# Patient Record
Sex: Male | Born: 1940
Health system: Southern US, Community
[De-identification: ages and names within clinical notes are randomized; demographics above are authoritative.]

## PROBLEM LIST (undated history)

## (undated) DIAGNOSIS — E785 Hyperlipidemia, unspecified: Secondary | ICD-10-CM

## (undated) DIAGNOSIS — I10 Essential (primary) hypertension: Secondary | ICD-10-CM

## (undated) DIAGNOSIS — K219 Gastro-esophageal reflux disease without esophagitis: Secondary | ICD-10-CM

## (undated) HISTORY — PX: EYE SURGERY: SHX253

---

## 2011-01-24 ENCOUNTER — Emergency Department: Payer: Self-pay

## 2011-05-27 HISTORY — PX: BACK SURGERY: SHX140

## 2015-07-07 ENCOUNTER — Emergency Department: Payer: Medicare HMO

## 2015-07-07 ENCOUNTER — Encounter: Payer: Self-pay | Admitting: Emergency Medicine

## 2015-07-07 ENCOUNTER — Emergency Department
Admission: EM | Admit: 2015-07-07 | Discharge: 2015-07-07 | Disposition: A | Discharge status: Home / Self Care | Payer: Medicare HMO | Attending: Emergency Medicine | Admitting: Emergency Medicine

## 2015-07-07 DIAGNOSIS — Z7982 Long term (current) use of aspirin: Secondary | ICD-10-CM | POA: Insufficient documentation

## 2015-07-07 DIAGNOSIS — G8929 Other chronic pain: Secondary | ICD-10-CM | POA: Insufficient documentation

## 2015-07-07 DIAGNOSIS — I10 Essential (primary) hypertension: Secondary | ICD-10-CM | POA: Insufficient documentation

## 2015-07-07 DIAGNOSIS — K5900 Constipation, unspecified: Secondary | ICD-10-CM | POA: Insufficient documentation

## 2015-07-07 DIAGNOSIS — M5442 Lumbago with sciatica, left side: Secondary | ICD-10-CM | POA: Diagnosis not present

## 2015-07-07 DIAGNOSIS — M545 Low back pain: Secondary | ICD-10-CM | POA: Diagnosis present

## 2015-07-07 DIAGNOSIS — Z79899 Other long term (current) drug therapy: Secondary | ICD-10-CM | POA: Diagnosis not present

## 2015-07-07 HISTORY — DX: Essential (primary) hypertension: I10

## 2015-07-07 LAB — URINALYSIS COMPLETE WITH MICROSCOPIC (ARMC ONLY)
BILIRUBIN URINE: NEGATIVE
Bacteria, UA: NONE SEEN
GLUCOSE, UA: NEGATIVE mg/dL
Hgb urine dipstick: NEGATIVE
KETONES UR: NEGATIVE mg/dL
LEUKOCYTES UA: NEGATIVE
NITRITE: NEGATIVE
PH: 6 (ref 5.0–8.0)
Protein, ur: NEGATIVE mg/dL
Specific Gravity, Urine: 1.02 (ref 1.005–1.030)

## 2015-07-07 MED ORDER — OXYCODONE-ACETAMINOPHEN 5-325 MG PO TABS
1.0000 | ORAL_TABLET | Freq: Once | ORAL | Status: AC
  Administered 2015-07-07: 1 via ORAL

## 2015-07-07 MED ORDER — TRAMADOL HCL 50 MG PO TABS: 50.0000 mg | ORAL_TABLET | Freq: Four times a day (QID) | ORAL | Status: DC | PRN

## 2015-07-07 MED ORDER — OXYCODONE-ACETAMINOPHEN 5-325 MG PO TABS
2.0000 | ORAL_TABLET | Freq: Once | ORAL | Status: DC
  Filled 2015-07-07: qty 1

## 2015-07-07 MED ORDER — KETOROLAC TROMETHAMINE 30 MG/ML IJ SOLN
30.0000 mg | Freq: Once | INTRAMUSCULAR | Status: AC
  Administered 2015-07-07: 30 mg via INTRAMUSCULAR
  Filled 2015-07-07: qty 1

## 2015-07-07 NOTE — ED Notes (Signed)
Low back going down left leg for approx one month.  States usually goes to the [facility] but they are uanble to get him in.  States hasn't been getting around as much for the past month.  Takes gabapentin and methocarbonal.

## 2015-07-07 NOTE — ED Provider Notes (Signed)
[facility] Emergency Department Provider Note ____________________________________________  Time seen: Approximately 12:07 PM  I have reviewed the triage vital signs and the nursing notes.   HISTORY  Chief Complaint Back Pain   HPI Jordan Chang is a 74 y.o. male is here with complaint of low back pain with radiation down his left leg for approximately 1 month. Patient states that he has had back surgery in the past and that he generally goes to the [facility] but his wife was unable to get him there today. He denies any urinary symptoms. There is been no injury to his back and he denies fall. Currently he is taking gabapentin and methocarbamol for his back pain. Wife states that just in the last week they increased his gabapentin.Currently he rates his pain as a 5 out of 10.   Past Medical History  Diagnosis Date  . Hypertension     There are no active problems to display for this patient.   Past Surgical History  Procedure Laterality Date  . Back surgery    . Eye surgery      Current Outpatient Rx  Name  Route  Sig  Dispense  Refill  . aspirin 81 MG tablet   Oral   Take 81 mg by mouth daily.         . Cholecalciferol (D3-1000 PO)   Oral   Take by mouth.         . gabapentin (NEURONTIN) 300 MG capsule   Oral   Take 300 mg by mouth 3 (three) times daily.         . hydrochlorothiazide (HYDRODIURIL) 25 MG tablet   Oral   Take 25 mg by mouth daily.         Marland Kitchen losartan (COZAAR) 50 MG tablet   Oral   Take 50 mg by mouth daily.         . methocarbamol (ROBAXIN) 500 MG tablet   Oral   Take 500 mg by mouth 3 (three) times daily.         Marland Kitchen oxyCODONE-acetaminophen (PERCOCET/ROXICET) 5-325 MG tablet   Oral   Take 1 tablet by mouth once.         . traMADol (ULTRAM) 50 MG tablet   Oral   Take 1 tablet (50 mg total) by mouth every 6 (six) hours as needed.   20 tablet   0   . triamterene-hydrochlorothiazide (DYAZIDE) 37.5-25  MG capsule   Oral   Take 1 capsule by mouth daily.         . vitamin B-12 (CYANOCOBALAMIN) 1000 MCG tablet   Oral   Take 1,000 mcg by mouth daily.           Allergies Review of patient's allergies indicates no known allergies.  No family history on file.  Social History Social History  Substance Use Topics  . Smoking status: Never Smoker   . Smokeless tobacco: None  . Alcohol Use: No    Review of Systems Constitutional: No fever/chills Eyes: No visual changes. ENT: No sore throat. Cardiovascular: Denies chest pain. Respiratory: Denies shortness of breath. Gastrointestinal: No abdominal pain.  No nausea, no vomiting.  No diarrhea.  Occasional constipation. Genitourinary: Negative for dysuria. Musculoskeletal: Positive for back pain. Skin: Negative for rash. Neurological: Negative for headaches, focal weakness. Positive for left leg paresthesias.  10-point ROS otherwise negative.  ____________________________________________   PHYSICAL EXAM:  VITAL SIGNS: ED Triage Vitals  Enc Vitals Group  BP 07/07/15 1114 150/64 mmHg     Pulse Rate 07/07/15 1114 82     Resp 07/07/15 1114 20     Temp 07/07/15 1114 97.6 F (36.4 C)     Temp Source 07/07/15 1114 Oral     SpO2 07/07/15 1114 99 %     Weight 07/07/15 1114 260 lb (117.935 kg)     Height 07/07/15 1114 6' (1.829 m)     Head Cir --      Peak Flow --      Pain Score 07/07/15 1116 5     Pain Loc --      Pain Edu? --      Excl. in GC? --     Constitutional: Alert and oriented. Well appearing and in no acute distress. Eyes: Conjunctivae are normal. PERRL. EOMI. Head: Atraumatic. Nose: No congestion/rhinnorhea. Neck: No stridor.   Cardiovascular: Normal rate, regular rhythm. Grossly normal heart sounds.  Good peripheral circulation. Respiratory: Normal respiratory effort.  No retractions. Lungs CTAB. Gastrointestinal: Soft and nontender. No distention. Bowel sounds are present 4  quadrants. Musculoskeletal: Moderate tenderness on palpation of the lumbar spine particularly L3-L4, L4-L5, L5 and S1. Range of motion is restricted secondary to patient's pain. No active muscle spasms were seen. Patient needed assistance to get up from a supine position to a sitting position secondary to pain. Gait was not tested due to patient's pain. Neurologic:  Normal speech and language. No gross focal neurologic deficits are appreciated. No gait instability. Skin:  Skin is warm, dry and intact. No rash noted. Psychiatric: Mood and affect are normal. Speech and behavior are normal.  ____________________________________________   LABS (all labs ordered are listed, but only abnormal results are displayed)  Labs Reviewed  URINALYSIS COMPLETEWITH MICROSCOPIC ([facility] ONLY) - Abnormal; Notable for the following:    Color, Urine YELLOW (*)    APPearance CLEAR (*)    Squamous Epithelial / LPF 0-5 (*)    All other components within normal limits     RADIOLOGY  Lumbar spine shows scattered degenerative disc disease changes. Lumbar spine per radiologist. ____________________________________________   PROCEDURES  Procedure(s) performed: None  Critical Care performed: No  ____________________________________________   INITIAL IMPRESSION / ASSESSMENT AND PLAN / ED COURSE  Pertinent labs & imaging results that were available during my care of the patient were reviewed by me and considered in my medical decision making (see chart for details).  Patient states that when he sat up to go for his x-rays that the room was spinning which was probably related to the Percocet he was given before. We discussed pain medication and the increased risk of falling. Wife will call [facility] for further follow-up. ____________________________________________   FINAL CLINICAL IMPRESSION(S) / ED DIAGNOSES  Final diagnoses:  Chronic midline low back pain with left-sided sciatica      Tommi RumpsRhonda L  Summers, PA-C 07/07/15 1533  Minna AntisKevin Paduchowski, MD 07/07/15 [phone]

## 2015-07-07 NOTE — Discharge Instructions (Signed)
Chronic Back Pain  When back pain lasts longer than 3 months, it is called chronic back pain.People with chronic back pain often go through certain periods that are more intense (flare-ups).  CAUSES Chronic back pain can be caused by wear and tear (degeneration) on different structures in your back. These structures include:  The bones of your spine (vertebrae) and the joints surrounding your spinal cord and nerve roots (facets).  The strong, fibrous tissues that connect your vertebrae (ligaments). Degeneration of these structures may result in pressure on your nerves. This can lead to constant pain. HOME CARE INSTRUCTIONS  Avoid bending, heavy lifting, prolonged sitting, and activities which make the problem worse.  Take brief periods of rest throughout the day to reduce your pain. Lying down or standing usually is better than sitting while you are resting.  Take over-the-counter or prescription medicines only as directed by your caregiver. SEEK IMMEDIATE MEDICAL CARE IF:   You have weakness or numbness in one of your legs or feet.  You have trouble controlling your bladder or bowels.  You have nausea, vomiting, abdominal pain, shortness of breath, or fainting.   This information is not intended to replace advice given to you by your health care provider. Make sure you discuss any questions you have with your health care provider.   Document Released: 07/20/2004 Document Revised: 09/04/2011 Document Reviewed: 11/30/2014 Elsevier Interactive Patient Education 2016 [employer]    Follow-up with your doctor at the [facility] or the orthopedist that was given on your discharge papers today. A prescription for tramadol was given to you today to take for severe pain. The aware that this may increase  of drowsiness and increase your risk for falling.

## 2015-07-07 NOTE — ED Notes (Signed)
Pt denies injury or urinary S/S.

## 2015-12-02 ENCOUNTER — Encounter: Payer: Self-pay | Admitting: *Deleted

## 2015-12-02 ENCOUNTER — Emergency Department
Admission: EM | Admit: 2015-12-02 | Discharge: 2015-12-02 | Disposition: A | Discharge status: Home / Self Care | Payer: Commercial Managed Care - HMO | Attending: Emergency Medicine | Admitting: Emergency Medicine

## 2015-12-02 ENCOUNTER — Emergency Department: Payer: Commercial Managed Care - HMO

## 2015-12-02 DIAGNOSIS — I10 Essential (primary) hypertension: Secondary | ICD-10-CM | POA: Diagnosis not present

## 2015-12-02 DIAGNOSIS — M5432 Sciatica, left side: Secondary | ICD-10-CM

## 2015-12-02 DIAGNOSIS — M5442 Lumbago with sciatica, left side: Secondary | ICD-10-CM | POA: Insufficient documentation

## 2015-12-02 DIAGNOSIS — Z79899 Other long term (current) drug therapy: Secondary | ICD-10-CM | POA: Diagnosis not present

## 2015-12-02 DIAGNOSIS — Z7982 Long term (current) use of aspirin: Secondary | ICD-10-CM | POA: Diagnosis not present

## 2015-12-02 DIAGNOSIS — M545 Low back pain: Secondary | ICD-10-CM | POA: Diagnosis present

## 2015-12-02 MED ORDER — KETOROLAC TROMETHAMINE 30 MG/ML IJ SOLN
15.0000 mg | Freq: Once | INTRAMUSCULAR | Status: AC
  Administered 2015-12-02: 15 mg via INTRAVENOUS
  Filled 2015-12-02: qty 1

## 2015-12-02 MED ORDER — PREDNISONE 10 MG (21) PO TBPK
ORAL_TABLET | ORAL | Status: DC
Start: 2015-12-02 — End: 2016-01-12

## 2015-12-02 MED ORDER — HYDROMORPHONE HCL 1 MG/ML IJ SOLN
1.0000 mg | Freq: Once | INTRAMUSCULAR | Status: AC
Start: 2015-12-02 — End: 2015-12-02
  Administered 2015-12-02: 1 mg via INTRAVENOUS
  Filled 2015-12-02: qty 1

## 2015-12-02 MED ORDER — DEXAMETHASONE SODIUM PHOSPHATE 10 MG/ML IJ SOLN
10.0000 mg | Freq: Once | INTRAMUSCULAR | Status: AC
  Administered 2015-12-02: 10 mg via INTRAVENOUS
  Filled 2015-12-02: qty 1

## 2015-12-02 MED ORDER — HYDROMORPHONE HCL 1 MG/ML IJ SOLN
0.5000 mg | Freq: Once | INTRAMUSCULAR | Status: AC
  Administered 2015-12-02: 0.5 mg via INTRAVENOUS
  Filled 2015-12-02: qty 1

## 2015-12-02 MED ORDER — HYDROMORPHONE HCL 2 MG PO TABS: 2.0000 mg | ORAL_TABLET | Freq: Two times a day (BID) | ORAL | Status: DC | PRN

## 2015-12-02 NOTE — Discharge Instructions (Signed)

## 2015-12-02 NOTE — ED Provider Notes (Signed)
[facility] Emergency Department Provider Note        Time seen: ----------------------------------------- 2:24 PM on 12/02/2015 -----------------------------------------    I have reviewed the triage vital signs and the nursing notes.   HISTORY  Chief Complaint Shortness of Breath and Hip Pain    HPI Jordan Chang is a 75 y.o. male who presents to ER with main complaints of radicular left leg pain. Patient has pain in his low back and around his left hip that radiates along the outside of his left upper and lower leg. He denies any fevers or chills, denies loss of bowel or bladder function. Denies numbness. He has been given pain medicine by his doctor that is not helping.   Past Medical History  Diagnosis Date  . Hypertension     There are no active problems to display for this patient.   Past Surgical History  Procedure Laterality Date  . Back surgery    . Eye surgery      Allergies Review of patient's allergies indicates no known allergies.  Social History Social History  Substance Use Topics  . Smoking status: Never Smoker   . Smokeless tobacco: None  . Alcohol Use: No    Review of Systems Constitutional: Negative for fever. Cardiovascular: Negative for chest pain. Respiratory: Positive for recent shortness of breath Gastrointestinal: Negative for abdominal pain, vomiting and diarrhea. Musculoskeletal: Positive for low back pain, radicular left leg pain Skin: Negative for rash. Neurological: Negative for headaches, focal weakness or numbness.  10-point ROS otherwise negative.  ____________________________________________   PHYSICAL EXAM:  VITAL SIGNS: ED Triage Vitals  Enc Vitals Group     BP 12/02/15 1139 180/75 mmHg     Pulse Rate 12/02/15 1139 101     Resp 12/02/15 1139 20     Temp 12/02/15 1139 99 F (37.2 C)     Temp Source 12/02/15 1139 Oral     SpO2 12/02/15 1139 95 %     Weight 12/02/15 1139 275 lb  (124.739 kg)     Height 12/02/15 1139 6' (1.829 m)     Head Cir --      Peak Flow --      Pain Score 12/02/15 1139 9     Pain Loc --      Pain Edu? --      Excl. in GC? --    Constitutional: Alert and oriented. Well appearing and in no distress.Obese Eyes: Conjunctivae are normal. PERRL. Normal extraocular movements. Cardiovascular: Normal rate, regular rhythm. No murmurs, rubs, or gallops. Respiratory: Normal respiratory effort without tachypnea nor retractions. Breath sounds are clear and equal bilaterally. No wheezes/rales/rhonchi. Gastrointestinal: Soft and nontender. Normal bowel sounds Musculoskeletal: Nontender with normal range of motion in all extremities. No lower extremity tenderness nor edema. L5 radiculopathy described in the left leg Neurologic:  Normal speech and language. No gross focal neurologic deficits are appreciated. Good reflexes in the left leg Skin:  Skin is warm, dry and intact. No rash noted. Psychiatric: Mood and affect are normal. Speech and behavior are normal.  ____________________________________________  EKG: Interpreted by me. Sinus tachycardia with a rate of 102 bpm, normal PR interval, normal QRS, normal QT interval, left axis deviation, incomplete right bundle branch block.  ____________________________________________  ED COURSE:  Pertinent labs & imaging results that were available during my care of the patient were reviewed by me and considered in my medical decision making (see chart for details). Patient presents to ER with Radicular left  leg pain consistent with sciatica, likely L5 distribution. Patient will be given Dilaudid, Decadron, Toradol. ____________________________________________    RADIOLOGY  Chest x-ray is unremarkable  ____________________________________________  FINAL ASSESSMENT AND PLAN  Sciatica  Plan: Patient with imaging as dictated above. Patient is in no acute distress, dyspnea he thinks was from anxiety. He  does describe radiculopathy and left leg which has been intermittent since January. He'll be discharged with steroid taper and stronger pain medication. He stable for outpatient follow-up with his doctor.   Emily Filbert, MD   Note: This dictation was prepared with Dragon dictation. Any transcriptional errors that result from this process are unintentional   Emily Filbert, MD 12/02/15 1429

## 2015-12-02 NOTE — ED Notes (Signed)
MD at bedside. 

## 2015-12-02 NOTE — ED Notes (Signed)
Pt complains of shortness of breath and right hip and leg pain, pt is in no resp distress

## 2015-12-07 ENCOUNTER — Other Ambulatory Visit: Payer: Self-pay | Admitting: Internal Medicine

## 2015-12-10 ENCOUNTER — Other Ambulatory Visit: Payer: Self-pay | Admitting: Internal Medicine

## 2015-12-10 DIAGNOSIS — M5416 Radiculopathy, lumbar region: Secondary | ICD-10-CM

## 2015-12-18 ENCOUNTER — Ambulatory Visit
Admission: RE | Admit: 2015-12-18 | Discharge: 2015-12-18 | Disposition: A | Discharge status: Home / Self Care | Payer: Commercial Managed Care - HMO | Source: Ambulatory Visit | Attending: Internal Medicine | Admitting: Internal Medicine

## 2015-12-18 DIAGNOSIS — M5416 Radiculopathy, lumbar region: Secondary | ICD-10-CM

## 2015-12-18 MED ORDER — GADOBENATE DIMEGLUMINE 529 MG/ML IV SOLN
20.0000 mL | Freq: Once | INTRAVENOUS | Status: AC | PRN
  Administered 2015-12-18: 20 mL via INTRAVENOUS

## 2016-01-11 ENCOUNTER — Other Ambulatory Visit: Payer: Self-pay | Admitting: Neurosurgery

## 2016-01-17 ENCOUNTER — Encounter (HOSPITAL_COMMUNITY): Payer: Self-pay

## 2016-01-17 ENCOUNTER — Encounter (HOSPITAL_COMMUNITY)
Admission: RE | Admit: 2016-01-17 | Discharge: 2016-01-17 | Disposition: A | Discharge status: Home / Self Care | Payer: Commercial Managed Care - HMO | Source: Ambulatory Visit | Attending: Neurosurgery | Admitting: Neurosurgery

## 2016-01-17 DIAGNOSIS — I10 Essential (primary) hypertension: Secondary | ICD-10-CM | POA: Diagnosis not present

## 2016-01-17 DIAGNOSIS — K219 Gastro-esophageal reflux disease without esophagitis: Secondary | ICD-10-CM | POA: Diagnosis not present

## 2016-01-17 DIAGNOSIS — Z7982 Long term (current) use of aspirin: Secondary | ICD-10-CM | POA: Diagnosis not present

## 2016-01-17 DIAGNOSIS — Z791 Long term (current) use of non-steroidal anti-inflammatories (NSAID): Secondary | ICD-10-CM | POA: Diagnosis not present

## 2016-01-17 DIAGNOSIS — M5116 Intervertebral disc disorders with radiculopathy, lumbar region: Secondary | ICD-10-CM | POA: Diagnosis not present

## 2016-01-17 DIAGNOSIS — Z79899 Other long term (current) drug therapy: Secondary | ICD-10-CM | POA: Diagnosis not present

## 2016-01-17 HISTORY — DX: Gastro-esophageal reflux disease without esophagitis: K21.9

## 2016-01-17 LAB — BASIC METABOLIC PANEL
Anion gap: 8 (ref 5–15)
BUN: 24 mg/dL — AB (ref 6–20)
CHLORIDE: 107 mmol/L (ref 101–111)
CO2: 22 mmol/L (ref 22–32)
Calcium: 9.9 mg/dL (ref 8.9–10.3)
Creatinine, Ser: 1.44 mg/dL — ABNORMAL HIGH (ref 0.61–1.24)
GFR calc Af Amer: 53 mL/min — ABNORMAL LOW (ref >60)
GFR calc non Af Amer: 46 mL/min — ABNORMAL LOW (ref >60)
Glucose, Bld: 165 mg/dL — ABNORMAL HIGH (ref 65–99)
POTASSIUM: 4.1 mmol/L (ref 3.5–5.1)
SODIUM: 137 mmol/L (ref 135–145)

## 2016-01-17 LAB — CBC
HEMATOCRIT: 44.1 % (ref 39.0–52.0)
Hemoglobin: 15 g/dL (ref 13.0–17.0)
MCH: 31.4 pg (ref 26.0–34.0)
MCHC: 34 g/dL (ref 30.0–36.0)
MCV: 92.3 fL (ref 78.0–100.0)
Platelets: 182 10*3/uL (ref 150–400)
RBC: 4.78 MIL/uL (ref 4.22–5.81)
RDW: 12.8 % (ref 11.5–15.5)
WBC: 6.2 10*3/uL (ref 4.0–10.5)

## 2016-01-17 LAB — SURGICAL PCR SCREEN
MRSA, PCR: NEGATIVE
Staphylococcus aureus: NEGATIVE

## 2016-01-17 NOTE — Pre-Procedure Instructions (Signed)
Jordan Chang  01/17/2016      [facility] - [place], [place] - [address] [place] [address] Phone: [phone] Fax: [phone]    Your procedure is scheduled on 01/20/16  Report to [facility] Admitting at 1000 A.M.  Call this number if you have problems the morning of surgery:  [phone]   Remember:  Do not eat food or drink liquids after midnight.  Take these medicines the morning of surgery with A SIP OF WATER gabpentin, pepcid, tramadol if needed   STOP all herbel meds, nsaids (aleve,naproxen,advil,ibuprofen) 5 days prior to surgery including all vitamins, aspirin   Do not wear jewelry, make-up or nail polish.  Do not wear lotions, powders, or perfumes.  You may wear deoderant.  Do not shave 48 hours prior to surgery.  Men may shave face and neck.  Do not bring valuables to the hospital.  [facility] is not responsible for any belongings or valuables.  Contacts, dentures or bridgework may not be worn into surgery.  Leave your suitcase in the car.  After surgery it may be brought to your room.  For patients admitted to the hospital, discharge time will be determined by your treatment team.  Patients discharged the day of surgery will not be allowed to drive home.   Name and phone number of your driver:    Special instructions:   Special Instructions: [facility] - Preparing for Surgery  Before surgery, you can play an important role.  Because skin is not sterile, your skin needs to be as free of germs as possible.  You can reduce the number of germs on you skin by washing with CHG (chlorahexidine gluconate) soap before surgery.  CHG is an antiseptic cleaner which kills germs and bonds with the skin to continue killing germs even after washing.  Please DO NOT use if you have an allergy to CHG or antibacterial soaps.  If your skin becomes reddened/irritated stop using the CHG and inform your nurse when you arrive at Short  Stay.  Do not shave (including legs and underarms) for at least 48 hours prior to the first CHG shower.  You may shave your face.  Please follow these instructions carefully:   1.  Shower with CHG Soap the night before surgery and the morning of Surgery.  2.  If you choose to wash your hair, wash your hair first as usual with your normal shampoo.  3.  After you shampoo, rinse your hair and body thoroughly to remove the Shampoo.  4.  Use CHG as you would any other liquid soap.  You can apply chg directly  to the skin and wash gently with scrungie or a clean washcloth.  5.  Apply the CHG Soap to your body ONLY FROM THE NECK DOWN.  Do not use on open wounds or open sores.  Avoid contact with your eyes ears, mouth and genitals (private parts).  Wash genitals (private parts)       with your normal soap.  6.  Wash thoroughly, paying special attention to the area where your surgery will be performed.  7.  Thoroughly rinse your body with warm water from the neck down.  8.  DO NOT shower/wash with your normal soap after using and rinsing off the CHG Soap.  9.  Pat yourself dry with a clean towel.            10.  Wear clean  pajamas.            11.  Place clean sheets on your bed the night of your first shower and do not sleep with pets.  Day of Surgery  Do not apply any lotions/deodorants the morning of surgery.  Please wear clean clothes to the hospital/surgery center.  Please read over the following fact sheets that you were given. MRSA Information

## 2016-01-17 NOTE — Progress Notes (Signed)
   01/17/16 0835  OBSTRUCTIVE SLEEP APNEA  Have you ever been diagnosed with sleep apnea through a sleep study? No  Do you snore loudly (loud enough to be heard through closed doors)?  0  Do you often feel tired, fatigued, or sleepy during the daytime (such as falling asleep during driving or talking to someone)? 0  Has anyone observed you stop breathing during your sleep? 0  Do you have, or are you being treated for high blood pressure? 1  BMI more than 35 kg/m2? 1  Age > 50 (1-yes) 1  Neck circumference greater than:Male 16 inches or larger, Male 17inches or larger? 1 (47.5)  Male Gender (Yes=1) 1  Obstructive Sleep Apnea Score 5  Score 5 or greater  Results sent to PCP

## 2016-01-18 NOTE — Progress Notes (Signed)
Anesthesia Chart Review: Patient is a 75 year old male scheduled for left L2-3, L3-4, L4-5 microdiskectomy on 01/20/16 by Dr. Lovell Sheehan.  History includes HTN, GERD, non-smoker, back surgery '12. BMI is consistent with obesity. OSA screening score was 5.   PCP is Dr. Marcello Fennel ([facility]).   Meds include ASA, Lipitor, Neurontin, HCTZ, losartan, Pepcid, tramadol, Dyazide.  12/02/15 EKG (done in the ED during evaluation for hip pain): ST at 102 bpm, LAD, pulmonary disease pattern, incomplete right BBB. Baseline wanderer. HR was 83 at PAT.  12/02/15 CXR: IMPRESSION: No active cardiopulmonary disease.  Preoperative labs noted. BUN 24, Cr 1.44. (Comparison labs in [facility] Everywhere from 11/17/15 showed BUN 22/Cr 1.2.) CBC WNL.  If no acute changes then I anticipate that he can proceed as planned.  Velna Ochs [facility] Short Stay Center/Anesthesiology Phone [phone] 01/18/2016 3:34 PM

## 2016-01-19 MED ORDER — DEXTROSE 5 % IV SOLN
3.0000 g | INTRAVENOUS | Status: AC
  Administered 2016-01-20: 3 g via INTRAVENOUS
  Filled 2016-01-19: qty 3000

## 2016-01-20 ENCOUNTER — Encounter (HOSPITAL_COMMUNITY): Payer: Self-pay | Admitting: *Deleted

## 2016-01-20 ENCOUNTER — Inpatient Hospital Stay (HOSPITAL_COMMUNITY): Payer: Commercial Managed Care - HMO

## 2016-01-20 ENCOUNTER — Inpatient Hospital Stay (HOSPITAL_COMMUNITY): Payer: Commercial Managed Care - HMO | Admitting: Anesthesiology

## 2016-01-20 ENCOUNTER — Inpatient Hospital Stay (HOSPITAL_COMMUNITY): Payer: Commercial Managed Care - HMO | Admitting: Vascular Surgery

## 2016-01-20 ENCOUNTER — Encounter (HOSPITAL_COMMUNITY)
Admission: RE | Disposition: A | Discharge status: Home / Self Care | Payer: Self-pay | Source: Ambulatory Visit | Attending: Neurosurgery

## 2016-01-20 ENCOUNTER — Observation Stay (HOSPITAL_COMMUNITY)
Admission: RE | Admit: 2016-01-20 | Discharge: 2016-01-21 | Disposition: A | Discharge status: Home / Self Care | Payer: Commercial Managed Care - HMO | Source: Ambulatory Visit | Attending: Neurosurgery | Admitting: Neurosurgery

## 2016-01-20 DIAGNOSIS — I10 Essential (primary) hypertension: Secondary | ICD-10-CM | POA: Diagnosis not present

## 2016-01-20 DIAGNOSIS — M5116 Intervertebral disc disorders with radiculopathy, lumbar region: Principal | ICD-10-CM | POA: Insufficient documentation

## 2016-01-20 DIAGNOSIS — Z7982 Long term (current) use of aspirin: Secondary | ICD-10-CM | POA: Insufficient documentation

## 2016-01-20 DIAGNOSIS — K219 Gastro-esophageal reflux disease without esophagitis: Secondary | ICD-10-CM | POA: Diagnosis not present

## 2016-01-20 DIAGNOSIS — M5126 Other intervertebral disc displacement, lumbar region: Secondary | ICD-10-CM | POA: Diagnosis present

## 2016-01-20 DIAGNOSIS — Z419 Encounter for procedure for purposes other than remedying health state, unspecified: Secondary | ICD-10-CM

## 2016-01-20 DIAGNOSIS — Z791 Long term (current) use of non-steroidal anti-inflammatories (NSAID): Secondary | ICD-10-CM | POA: Insufficient documentation

## 2016-01-20 DIAGNOSIS — Z79899 Other long term (current) drug therapy: Secondary | ICD-10-CM | POA: Insufficient documentation

## 2016-01-20 HISTORY — PX: LUMBAR LAMINECTOMY/DECOMPRESSION MICRODISCECTOMY: SHX5026

## 2016-01-20 SURGERY — LUMBAR LAMINECTOMY/DECOMPRESSION MICRODISCECTOMY 3 LEVELS
Anesthesia: General | Site: Back | Laterality: Left

## 2016-01-20 MED ORDER — ROCURONIUM BROMIDE 100 MG/10ML IV SOLN
INTRAVENOUS | Status: DC | PRN
  Administered 2016-01-20: 50 mg via INTRAVENOUS

## 2016-01-20 MED ORDER — CEFAZOLIN SODIUM-DEXTROSE 2-4 GM/100ML-% IV SOLN
2.0000 g | Freq: Three times a day (TID) | INTRAVENOUS | Status: AC
  Administered 2016-01-20: 2 g via INTRAVENOUS
  Filled 2016-01-20: qty 100

## 2016-01-20 MED ORDER — DOCUSATE SODIUM 100 MG PO CAPS
100.0000 mg | ORAL_CAPSULE | Freq: Two times a day (BID) | ORAL | Status: DC
  Administered 2016-01-20 – 2016-01-21 (×2): 100 mg via ORAL
  Filled 2016-01-20 (×2): qty 1

## 2016-01-20 MED ORDER — OXYCODONE-ACETAMINOPHEN 5-325 MG PO TABS
1.0000 | ORAL_TABLET | Freq: Once | ORAL | Status: AC
  Administered 2016-01-20: 1 via ORAL
  Filled 2016-01-20: qty 1

## 2016-01-20 MED ORDER — PHENOL 1.4 % MT LIQD: 1.0000 | OROMUCOSAL | Status: DC | PRN

## 2016-01-20 MED ORDER — BACITRACIN ZINC 500 UNIT/GM EX OINT
TOPICAL_OINTMENT | CUTANEOUS | Status: DC | PRN
  Administered 2016-01-20: 1 via TOPICAL

## 2016-01-20 MED ORDER — ATORVASTATIN CALCIUM 20 MG PO TABS
10.0000 mg | ORAL_TABLET | ORAL | Status: DC
  Administered 2016-01-21: 10 mg via ORAL
  Filled 2016-01-20: qty 1

## 2016-01-20 MED ORDER — SODIUM CHLORIDE 0.9 % IJ SOLN
INTRAMUSCULAR | Status: AC
  Filled 2016-01-20: qty 10

## 2016-01-20 MED ORDER — LACTATED RINGERS IV SOLN: INTRAVENOUS | Status: DC

## 2016-01-20 MED ORDER — LIDOCAINE 2% (20 MG/ML) 5 ML SYRINGE
INTRAMUSCULAR | Status: AC
  Filled 2016-01-20: qty 15

## 2016-01-20 MED ORDER — ACETAMINOPHEN 650 MG RE SUPP: 650.0000 mg | RECTAL | Status: DC | PRN

## 2016-01-20 MED ORDER — MEPERIDINE HCL 25 MG/ML IJ SOLN: 6.2500 mg | INTRAMUSCULAR | Status: DC | PRN

## 2016-01-20 MED ORDER — SUCCINYLCHOLINE CHLORIDE 200 MG/10ML IV SOSY
PREFILLED_SYRINGE | INTRAVENOUS | Status: AC
  Filled 2016-01-20: qty 10

## 2016-01-20 MED ORDER — DIAZEPAM 5 MG PO TABS
5.0000 mg | ORAL_TABLET | Freq: Four times a day (QID) | ORAL | Status: DC | PRN
  Administered 2016-01-20 – 2016-01-21 (×3): 5 mg via ORAL
  Filled 2016-01-20 (×3): qty 1

## 2016-01-20 MED ORDER — HYDROMORPHONE HCL 1 MG/ML IJ SOLN: 0.2500 mg | INTRAMUSCULAR | Status: DC | PRN

## 2016-01-20 MED ORDER — MIDAZOLAM HCL 5 MG/5ML IJ SOLN
INTRAMUSCULAR | Status: DC | PRN
  Administered 2016-01-20: 2 mg via INTRAVENOUS

## 2016-01-20 MED ORDER — MIDAZOLAM HCL 2 MG/2ML IJ SOLN
INTRAMUSCULAR | Status: AC
  Filled 2016-01-20: qty 2

## 2016-01-20 MED ORDER — PHENYLEPHRINE HCL 10 MG/ML IJ SOLN
INTRAMUSCULAR | Status: DC | PRN
  Administered 2016-01-20 (×4): 80 ug via INTRAVENOUS

## 2016-01-20 MED ORDER — GABAPENTIN 300 MG PO CAPS
600.0000 mg | ORAL_CAPSULE | Freq: Three times a day (TID) | ORAL | Status: DC
  Administered 2016-01-20 – 2016-01-21 (×2): 600 mg via ORAL
  Filled 2016-01-20 (×2): qty 2

## 2016-01-20 MED ORDER — ACETAMINOPHEN 325 MG PO TABS: 650.0000 mg | ORAL_TABLET | ORAL | Status: DC | PRN

## 2016-01-20 MED ORDER — BUPIVACAINE-EPINEPHRINE (PF) 0.5% -1:200000 IJ SOLN
INTRAMUSCULAR | Status: DC | PRN
  Administered 2016-01-20 (×2): 10 mL

## 2016-01-20 MED ORDER — THROMBIN 5000 UNITS EX SOLR
OROMUCOSAL | Status: DC | PRN
  Administered 2016-01-20: 15:00:00 via TOPICAL

## 2016-01-20 MED ORDER — HYDROCODONE-ACETAMINOPHEN 5-325 MG PO TABS: 1.0000 | ORAL_TABLET | ORAL | Status: DC | PRN

## 2016-01-20 MED ORDER — LIDOCAINE HCL (CARDIAC) 20 MG/ML IV SOLN
INTRAVENOUS | Status: DC | PRN
  Administered 2016-01-20: 50 mg via INTRAVENOUS

## 2016-01-20 MED ORDER — CHLORHEXIDINE GLUCONATE CLOTH 2 % EX PADS: 6.0000 | MEDICATED_PAD | Freq: Once | CUTANEOUS | Status: DC

## 2016-01-20 MED ORDER — PHENYLEPHRINE HCL 10 MG/ML IJ SOLN
INTRAVENOUS | Status: DC | PRN
  Administered 2016-01-20: 40 ug/min via INTRAVENOUS

## 2016-01-20 MED ORDER — PROPOFOL 10 MG/ML IV BOLUS
INTRAVENOUS | Status: DC | PRN
  Administered 2016-01-20: 150 mg via INTRAVENOUS

## 2016-01-20 MED ORDER — MORPHINE SULFATE (PF) 2 MG/ML IV SOLN: 1.0000 mg | INTRAVENOUS | Status: DC | PRN

## 2016-01-20 MED ORDER — MENTHOL 3 MG MT LOZG: 1.0000 | LOZENGE | OROMUCOSAL | Status: DC | PRN

## 2016-01-20 MED ORDER — ONDANSETRON HCL 4 MG/2ML IJ SOLN
INTRAMUSCULAR | Status: DC | PRN
  Administered 2016-01-20: 4 mg via INTRAVENOUS

## 2016-01-20 MED ORDER — MORPHINE SULFATE (PF) 4 MG/ML IV SOLN
INTRAVENOUS | Status: AC
  Administered 2016-01-20: 4 mg
  Filled 2016-01-20: qty 1

## 2016-01-20 MED ORDER — FENTANYL CITRATE (PF) 250 MCG/5ML IJ SOLN
INTRAMUSCULAR | Status: AC
  Filled 2016-01-20: qty 5

## 2016-01-20 MED ORDER — 0.9 % SODIUM CHLORIDE (POUR BTL) OPTIME
TOPICAL | Status: DC | PRN
  Administered 2016-01-20: 1000 mL

## 2016-01-20 MED ORDER — SUFENTANIL CITRATE 50 MCG/ML IV SOLN
INTRAVENOUS | Status: AC
  Filled 2016-01-20: qty 1

## 2016-01-20 MED ORDER — MUPIROCIN 2 % EX OINT
TOPICAL_OINTMENT | CUTANEOUS | Status: AC
  Filled 2016-01-20: qty 22

## 2016-01-20 MED ORDER — TRIAMTERENE-HCTZ 37.5-25 MG PO TABS
1.0000 | ORAL_TABLET | Freq: Every day | ORAL | Status: DC
  Administered 2016-01-20 – 2016-01-21 (×2): 1 via ORAL
  Filled 2016-01-20 (×3): qty 1

## 2016-01-20 MED ORDER — FAMOTIDINE 20 MG PO TABS: 20.0000 mg | ORAL_TABLET | Freq: Every day | ORAL | Status: DC | PRN

## 2016-01-20 MED ORDER — BISACODYL 10 MG RE SUPP: 10.0000 mg | Freq: Every day | RECTAL | Status: DC | PRN

## 2016-01-20 MED ORDER — SODIUM CHLORIDE 0.9 % IR SOLN
Status: DC | PRN
  Administered 2016-01-20: 15:00:00

## 2016-01-20 MED ORDER — ALUM & MAG HYDROXIDE-SIMETH 200-200-20 MG/5ML PO SUSP: 30.0000 mL | Freq: Four times a day (QID) | ORAL | Status: DC | PRN

## 2016-01-20 MED ORDER — OXYCODONE-ACETAMINOPHEN 5-325 MG PO TABS
1.0000 | ORAL_TABLET | ORAL | Status: DC | PRN
  Administered 2016-01-20 – 2016-01-21 (×4): 2 via ORAL
  Filled 2016-01-20 (×4): qty 2

## 2016-01-20 MED ORDER — OXYCODONE-ACETAMINOPHEN 5-325 MG PO TABS
ORAL_TABLET | ORAL | Status: AC
  Filled 2016-01-20: qty 1

## 2016-01-20 MED ORDER — SUFENTANIL CITRATE 50 MCG/ML IV SOLN
INTRAVENOUS | Status: DC | PRN
  Administered 2016-01-20: 10 ug via INTRAVENOUS
  Administered 2016-01-20: 20 ug via INTRAVENOUS
  Administered 2016-01-20: 10 ug via INTRAVENOUS

## 2016-01-20 MED ORDER — THROMBIN 20000 UNITS EX SOLR
CUTANEOUS | Status: DC | PRN
  Administered 2016-01-20: 15:00:00 via TOPICAL

## 2016-01-20 MED ORDER — LOSARTAN POTASSIUM 50 MG PO TABS
50.0000 mg | ORAL_TABLET | Freq: Every day | ORAL | Status: DC
  Administered 2016-01-20 – 2016-01-21 (×2): 50 mg via ORAL
  Filled 2016-01-20 (×3): qty 1

## 2016-01-20 MED ORDER — LACTATED RINGERS IV SOLN
INTRAVENOUS | Status: DC
  Administered 2016-01-20 (×2): via INTRAVENOUS

## 2016-01-20 MED ORDER — PROPOFOL 10 MG/ML IV BOLUS
INTRAVENOUS | Status: AC
  Filled 2016-01-20: qty 40

## 2016-01-20 MED ORDER — ONDANSETRON HCL 4 MG/2ML IJ SOLN: 4.0000 mg | INTRAMUSCULAR | Status: DC | PRN

## 2016-01-20 MED ORDER — HYDROCHLOROTHIAZIDE 25 MG PO TABS: 25.0000 mg | ORAL_TABLET | Freq: Every day | ORAL | Status: DC

## 2016-01-20 MED ORDER — TRAMADOL HCL 50 MG PO TABS: 100.0000 mg | ORAL_TABLET | Freq: Four times a day (QID) | ORAL | Status: DC | PRN

## 2016-01-20 MED ORDER — PROMETHAZINE HCL 25 MG/ML IJ SOLN: 6.2500 mg | INTRAMUSCULAR | Status: DC | PRN

## 2016-01-20 SURGICAL SUPPLY — 53 items
BAG DECANTER FOR FLEXI CONT (MISCELLANEOUS) ×3 IMPLANT
BENZOIN TINCTURE PRP APPL 2/3 (GAUZE/BANDAGES/DRESSINGS) ×3 IMPLANT
BLADE CLIPPER SURG (BLADE) IMPLANT
BRUSH SCRUB EZ PLAIN DRY (MISCELLANEOUS) ×3 IMPLANT
BUR MATCHSTICK NEURO 3.0 LAGG (BURR) ×3 IMPLANT
BUR PRECISION FLUTE 6.0 (BURR) ×3 IMPLANT
CANISTER SUCT 3000ML PPV (MISCELLANEOUS) ×3 IMPLANT
CLOSURE WOUND 1/2 X4 (GAUZE/BANDAGES/DRESSINGS) ×1
DRAPE LAPAROTOMY 100X72X124 (DRAPES) ×3 IMPLANT
DRAPE MICROSCOPE LEICA (MISCELLANEOUS) ×3 IMPLANT
DRAPE POUCH INSTRU U-SHP 10X18 (DRAPES) ×3 IMPLANT
DRAPE SURG 17X23 STRL (DRAPES) ×12 IMPLANT
ELECT BLADE 4.0 EZ CLEAN MEGAD (MISCELLANEOUS) ×3
ELECT REM PT RETURN 9FT ADLT (ELECTROSURGICAL) ×3
ELECTRODE BLDE 4.0 EZ CLN MEGD (MISCELLANEOUS) ×1 IMPLANT
ELECTRODE REM PT RTRN 9FT ADLT (ELECTROSURGICAL) ×1 IMPLANT
GAUZE SPONGE 4X4 12PLY STRL (GAUZE/BANDAGES/DRESSINGS) ×3 IMPLANT
GAUZE SPONGE 4X4 16PLY XRAY LF (GAUZE/BANDAGES/DRESSINGS) IMPLANT
GLOVE BIO SURGEON STRL SZ8 (GLOVE) ×3 IMPLANT
GLOVE BIO SURGEON STRL SZ8.5 (GLOVE) ×3 IMPLANT
GLOVE BIOGEL PI IND STRL 7.0 (GLOVE) ×2 IMPLANT
GLOVE BIOGEL PI INDICATOR 7.0 (GLOVE) ×4
GLOVE EXAM NITRILE LRG STRL (GLOVE) IMPLANT
GLOVE EXAM NITRILE MD LF STRL (GLOVE) IMPLANT
GLOVE EXAM NITRILE XL STR (GLOVE) IMPLANT
GLOVE EXAM NITRILE XS STR PU (GLOVE) IMPLANT
GLOVE SURG SS PI 6.5 STRL IVOR (GLOVE) ×3 IMPLANT
GLOVE SURG SS PI 7.0 STRL IVOR (GLOVE) ×9 IMPLANT
GOWN STRL REUS W/ TWL LRG LVL3 (GOWN DISPOSABLE) ×1 IMPLANT
GOWN STRL REUS W/ TWL XL LVL3 (GOWN DISPOSABLE) ×3 IMPLANT
GOWN STRL REUS W/TWL 2XL LVL3 (GOWN DISPOSABLE) IMPLANT
GOWN STRL REUS W/TWL LRG LVL3 (GOWN DISPOSABLE) ×2
GOWN STRL REUS W/TWL XL LVL3 (GOWN DISPOSABLE) ×6
HEMOSTAT POWDER KIT SURGIFOAM (HEMOSTASIS) ×3 IMPLANT
KIT BASIN OR (CUSTOM PROCEDURE TRAY) ×3 IMPLANT
KIT ROOM TURNOVER OR (KITS) ×3 IMPLANT
NEEDLE HYPO 21X1.5 SAFETY (NEEDLE) IMPLANT
NEEDLE HYPO 22GX1.5 SAFETY (NEEDLE) ×3 IMPLANT
NS IRRIG 1000ML POUR BTL (IV SOLUTION) ×3 IMPLANT
PACK LAMINECTOMY NEURO (CUSTOM PROCEDURE TRAY) ×3 IMPLANT
PAD ARMBOARD 7.5X6 YLW CONV (MISCELLANEOUS) ×9 IMPLANT
PATTIES SURGICAL .5 X1 (DISPOSABLE) IMPLANT
RUBBERBAND STERILE (MISCELLANEOUS) ×6 IMPLANT
SPONGE SURGIFOAM ABS GEL 100 (HEMOSTASIS) ×3 IMPLANT
SPONGE SURGIFOAM ABS GEL SZ50 (HEMOSTASIS) IMPLANT
STRIP CLOSURE SKIN 1/2X4 (GAUZE/BANDAGES/DRESSINGS) ×2 IMPLANT
SUT VIC AB 1 CT1 18XBRD ANBCTR (SUTURE) ×1 IMPLANT
SUT VIC AB 1 CT1 8-18 (SUTURE) ×2
SUT VIC AB 2-0 CP2 18 (SUTURE) ×6 IMPLANT
TAPE CLOTH SURG 4X10 WHT LF (GAUZE/BANDAGES/DRESSINGS) ×3 IMPLANT
TOWEL OR 17X24 6PK STRL BLUE (TOWEL DISPOSABLE) ×3 IMPLANT
TOWEL OR 17X26 10 PK STRL BLUE (TOWEL DISPOSABLE) ×3 IMPLANT
WATER STERILE IRR 1000ML POUR (IV SOLUTION) ×3 IMPLANT

## 2016-01-20 NOTE — Anesthesia Postprocedure Evaluation (Signed)
Anesthesia Post Note  Patient: Jordan Chang  Procedure(s) Performed: Procedure(s) (LRB): Left Lumbar two-three, Lumbar three-four,  Lumbar four-five Microdiskectomy (Left)  Patient location during evaluation: PACU Anesthesia Type: General Level of consciousness: awake Vital Signs Assessment: post-procedure vital signs reviewed and stable Respiratory status: spontaneous breathing Cardiovascular status: stable Anesthetic complications: no    Last Vitals:  Vitals:   01/20/16 1745 01/20/16 1749  BP:  110/61  Pulse: 79 79  Resp: (!) 25 20  Temp: 36.7 C     Last Pain:  Vitals:   01/20/16 1715  TempSrc:   PainSc: Asleep                 EDWARDS,Vicke Plotner

## 2016-01-20 NOTE — H&P (Signed)
Subjective: The patient is a 76 year old white male who has had previous back surgery in 2012 at the [place] [facility]. He has developed recurrent  back, left buttock and leg pain consistent with a lumbar radiculopathy. He has failed medical management and was worked up with a lumbar MRI. This demonstrated the patient had left-sided herniated disc at L2-3, L3-4 and L4-5. I discussed the situation with the patient. We discussed the various treatment options. He has decided to proceed with surgery.   Past Medical History:  Diagnosis Date  . GERD (gastroesophageal reflux disease)    occ  . Hypertension     Past Surgical History:  Procedure Laterality Date  . BACK SURGERY  05/2011  . EYE SURGERY     cataracts    Allergies  Allergen Reactions  . Other Other (See Comments)    STEROIDS, not recorded, caused tachycardia Pt "does not know name"    Social History  Substance Use Topics  . Smoking status: Never Smoker  . Smokeless tobacco: Never Used  . Alcohol use No    History reviewed. No pertinent family history. Prior to Admission medications   Medication Sig Start Date End Date Taking? Authorizing Provider  aspirin EC 81 MG tablet Take 81 mg by mouth every morning.    Yes Historical Provider, MD  atorvastatin (LIPITOR) 10 MG tablet Take 10 mg by mouth every other day. 11/24/15 11/23/16 Yes Historical Provider, MD  Cholecalciferol (D3-1000 PO) Take 1,000 mcg by mouth daily.    Yes Historical Provider, MD  Cholecalciferol (VITAMIN D-1000 MAX ST) 1000 units tablet Take 2,000 Units by mouth daily.   Yes Historical Provider, MD  gabapentin (NEURONTIN) 300 MG capsule Take 600 mg by mouth 3 (three) times daily.    Yes Historical Provider, MD  hydrochlorothiazide (HYDRODIURIL) 25 MG tablet Take 25 mg by mouth daily.   Yes Historical Provider, MD  ibuprofen (GOODSENSE IBUPROFEN) 200 MG tablet Take 600 mg by mouth 3 (three) times daily.   Yes Historical Provider, MD  losartan (COZAAR) 50 MG tablet  Take 50 mg by mouth daily.   Yes Historical Provider, MD  PEPCID 20 MG tablet Take 20 mg by mouth daily as needed for heartburn or indigestion.  10/12/15  Yes Historical Provider, MD  traMADol (ULTRAM) 50 MG tablet Take 1 tablet (50 mg total) by mouth every 6 (six) hours as needed. Patient taking differently: Take 100 mg by mouth 3 (three) times daily.  07/07/15  Yes Tommi Rumps, PA-C  triamterene-hydrochlorothiazide (DYAZIDE) 37.5-25 MG capsule Take 1 capsule by mouth daily.   Yes Historical Provider, MD  vitamin B-12 (CYANOCOBALAMIN) 1000 MCG tablet Take 1,000 mcg by mouth daily.   Yes Historical Provider, MD     Review of Systems  Positive ROS: As above  All other systems have been reviewed and were otherwise negative with the exception of those mentioned in the HPI and as above.  Objective: Vital signs in last 24 hours: Temp:  [98 F (36.7 C)] 98 F (36.7 C) (07/27 1000) Pulse Rate:  [71] 71 (07/27 0945) Resp:  [20] 20 (07/27 0945) BP: (191)/(95) 191/95 (07/27 0945) SpO2:  [100 %] 100 % (07/27 0945) Weight:  [124.7 kg (275 lb)] 124.7 kg (275 lb) (07/27 0945)  General Appearance: Alert, cooperative, no distress, Head: Normocephalic, without obvious abnormality, atraumatic Eyes: PERRL, conjunctiva/corneas clear, EOM's intact,    Ears: Normal  Throat: Normal  Neck: Supple, symmetrical, trachea midline, no adenopathy; thyroid: No enlargement/tenderness/nodules; no carotid bruit  or JVD Back: Symmetric, no curvature, ROM normal, no CVA tenderness. The patient's lumbar incision is well-healed. Lungs: Clear to auscultation bilaterally, respirations unlabored Heart: Regular rate and rhythm, no murmur, rub or gallop Abdomen: Soft, non-tender,, no masses, no organomegaly Extremities: Extremities normal, atraumatic, no cyanosis or edema Pulses: 2+ and symmetric all extremities Skin: Skin color, texture, turgor normal, no rashes or lesions  NEUROLOGIC:   Mental status: alert and  oriented, no aphasia, good attention span, Fund of knowledge/ memory ok Motor Exam - grossly normal Sensory Exam - grossly normal Reflexes:  Coordination - grossly normal Gait - grossly normal Balance - grossly normal Cranial Nerves: I: smell Not tested  II: visual acuity  OS: Normal  OD: Normal   II: visual fields Full to confrontation  II: pupils Equal, round, reactive to light  III,VII: ptosis None  III,IV,VI: extraocular muscles  Full ROM  V: mastication Normal  V: facial light touch sensation  Normal  V,VII: corneal reflex  Present  VII: facial muscle function - upper  Normal  VII: facial muscle function - lower Normal  VIII: hearing Not tested  IX: soft palate elevation  Normal  IX,X: gag reflex Present  XI: trapezius strength  5/5  XI: sternocleidomastoid strength 5/5  XI: neck flexion strength  5/5  XII: tongue strength  Normal    Data Review Lab Results  Component Value Date   WBC 6.2 01/17/2016   HGB 15.0 01/17/2016   HCT 44.1 01/17/2016   MCV 92.3 01/17/2016   PLT 182 01/17/2016   Lab Results  Component Value Date   NA 137 01/17/2016   K 4.1 01/17/2016   CL 107 01/17/2016   CO2 22 01/17/2016   BUN 24 (H) 01/17/2016   CREATININE 1.44 (H) 01/17/2016   GLUCOSE 165 (H) 01/17/2016   No results found for: INR, PROTIME  Assessment/Plan: Left L2-3, L3-4 and L4-5 herniated disc, lumbago, lumbar radiculopathy: I have discussed the situation with the patient and reviewed his MRI scan with him. We have discussed the various treatment options including surgery. I have described the surgical treatment option of a left L2-3, L3-4 and L4-5 laminotomy foraminotomy and discectomy. I have shown him surgical models. We have discussed the risks, benefits, alternatives, expected postoperative course and likelihood of achieving our goals with surgery. I have answered all the patient's questions. He has decided to proceed with surgery.   Allyssa Abruzzese D 01/20/2016 2:32  PM

## 2016-01-20 NOTE — Transfer of Care (Signed)
Immediate Anesthesia Transfer of Care Note  Patient: Jordan Chang  Procedure(s) Performed: Procedure(s): Left Lumbar two-three, Lumbar three-four,  Lumbar four-five Microdiskectomy (Left)  Patient Location: PACU  Anesthesia Type:General  Level of Consciousness: sedated and patient cooperative  Airway & Oxygen Therapy: Patient Spontanous Breathing and Patient connected to nasal cannula oxygen  Post-op Assessment: Report given to RN, Post -op Vital signs reviewed and stable and Patient moving all extremities  Post vital signs: Reviewed and stable  Last Vitals:  Vitals:   01/20/16 0945 01/20/16 1000  BP: (!) 191/95   Pulse: 71   Resp: 20   Temp:  36.7 C    Last Pain:  Vitals:   01/20/16 1115  TempSrc:   PainSc: 5       Patients Stated Pain Goal: 5 (01/20/16 1000)  Complications: No apparent anesthesia complications

## 2016-01-20 NOTE — Progress Notes (Signed)
Subjective:  The patient is somnolent but easily arousable. He is in no apparent distress. He looks well.  Objective: Vital signs in last 24 hours: Temp:  [97.2 F (36.2 C)-98 F (36.7 C)] 97.2 F (36.2 C) (07/27 1715) Pulse Rate:  [71] 71 (07/27 0945) Resp:  [20] 20 (07/27 0945) BP: (191)/(95) 191/95 (07/27 0945) SpO2:  [100 %] 100 % (07/27 0945) Weight:  [124.7 kg (275 lb)] 124.7 kg (275 lb) (07/27 0945)  Intake/Output from previous day: No intake/output data recorded. Intake/Output this shift: Total I/O In: 1000 [I.V.:1000] Out: 100 [Blood:100]  Physical exam the patient is somewhat but easily arousable. He is moving his lower extremities well.  Lab Results: No results for input(s): WBC, HGB, HCT, PLT in the last 72 hours. BMET No results for input(s): NA, K, CL, CO2, GLUCOSE, BUN, CREATININE, CALCIUM in the last 72 hours.  Studies/Results: Dg Lumbar Spine 1 View  Result Date: 01/20/2016 CLINICAL DATA:  Portable lateral lumbar spine imaging for surgical localization. EXAM: LUMBAR SPINE - 1 VIEW COMPARISON:  Lumbar MRI, 12/18/2015 FINDINGS: Single portable lateral image shows placement of a surgical probe through posterior skin retractors. The tip projects 3.5 cm posterior to the posterior margin of the L4-L5 disc. IMPRESSION: Surgical localization imaging for lumbar microdiskectomy as described. Electronically Signed   By: Amie Portland M.D.   On: 01/20/2016 16:16   Assessment/Plan: The patient is doing well.  LOS: 0 days     Jordan Chang D 01/20/2016, 5:26 PM

## 2016-01-20 NOTE — Anesthesia Preprocedure Evaluation (Addendum)
Anesthesia Evaluation  Patient identified by MRN, date of birth, ID band Patient awake    Reviewed: Allergy & Precautions, NPO status , Patient's Chart, lab work & pertinent test results  Airway Mallampati: II  TM Distance: >3 FB Neck ROM: Full    Dental  (+) Teeth Intact, Dental Advisory Given   Pulmonary neg pulmonary ROS,    breath sounds clear to auscultation       Cardiovascular hypertension, Pt. on medications  Rhythm:Regular Rate:Normal     Neuro/Psych negative neurological ROS  negative psych ROS   GI/Hepatic Neg liver ROS, GERD  Medicated,  Endo/Other  negative endocrine ROS  Renal/GU negative Renal ROS  negative genitourinary   Musculoskeletal negative musculoskeletal ROS (+)   Abdominal   Peds negative pediatric ROS (+)  Hematology negative hematology ROS (+)   Anesthesia Other Findings   Reproductive/Obstetrics negative OB ROS                            Lab Results  Component Value Date   WBC 6.2 01/17/2016   HGB 15.0 01/17/2016   HCT 44.1 01/17/2016   MCV 92.3 01/17/2016   PLT 182 01/17/2016   Lab Results  Component Value Date   CREATININE 1.44 (H) 01/17/2016   BUN 24 (H) 01/17/2016   NA 137 01/17/2016   K 4.1 01/17/2016   CL 107 01/17/2016   CO2 22 01/17/2016   No results found for: INR, PROTIME  11/2015 EKG: normal sinus rhythm, RBBB.   Anesthesia Physical Anesthesia Plan  ASA: III  Anesthesia Plan: General   Post-op Pain Management:    Induction: Intravenous  Airway Management Planned: Oral ETT  Additional Equipment:   Intra-op Plan:   Post-operative Plan: Extubation in OR  Informed Consent: I have reviewed the patients History and Physical, chart, labs and discussed the procedure including the risks, benefits and alternatives for the proposed anesthesia with the patient or authorized representative who has indicated his/her understanding and  acceptance.   Dental advisory given  Plan Discussed with: CRNA  Anesthesia Plan Comments:         Anesthesia Quick Evaluation

## 2016-01-20 NOTE — Op Note (Signed)
Brief history: The patient is a 75 year old white male who's had previous lumbar discectomy at the [place]  in 2012. He has developed recurrent back and left leg pain consistent with a lumbar radiculopathy. He has failed medical management and was worked up with a lumbar MRI. This demonstrated left-sided herniated disc at L2-3, L3-4 and L4-5. I discussed the various treatment options with the patient including surgery. He has decided to proceed with a lumbar discectomy.  Preoperative diagnosis: L2-3, L3-4 and L4-5 herniated disc, lumbago, lumbar radiculopathy  Postoperative diagnosis: The same  Procedure: Left L2-3, L3-4 and L4-5 Intervertebral discectomy using micro-dissection  Surgeon: Dr. Delma Officer  Asst.: Dr. Marikay Alar  Anesthesia: Gen. endotracheal  Estimated blood loss: Minimal  Drains: None  Complications: None  Description of procedure: The patient was brought to the operating room by the anesthesia team. General endotracheal anesthesia was induced. The patient was turned to the prone position on the Wilson frame. The patient's lumbosacral region was then prepared with Betadine scrub and Betadine solution. Sterile drapes were applied.  I then injected the area to be incised with Marcaine with epinephrine solution. I then used a scalpel to make a linear midline incision over the L2-3, L3-4 and L4-5 intervertebral disc space, incising through the old surgical scar. I then used electrocautery to perform a left-sided sided subperiosteal dissection exposing the spinous process and lamina of L2, L3, L4 and L5. We obtained intraoperative radiograph to confirm our location. I then inserted the [facility] retractor for exposure.  We then brought the operative microscope into the field. Under its magnification and illumination we completed the microdissection. I used a high-speed drill to perform a laminotomy at L2-3, L3-4 and L4-5 on the left. I then used a Kerrison punches to widen the  laminotomy and removed the ligamentum flavum at L2-3, L3-4 and L4-5 on the left. We then used microdissection to free up the thecal sac and the left L3, L4 and L5 nerve root from the epidural tissue. We did encounter some scar tissue at L4-5 on the left, from the old operation, as expected. I then used a Kerrison punch to perform a foraminotomy at about the left L3, L4 and L5 nerve root. We then using the nerve root retractor to gently retract the thecal sac and the left L3, L4 and L5 nerve root medially sequentially. This exposed the intervertebral disc. We identified the ruptured disks at L2-3, L3-4 and L4-5 and removed them with the pituitary forceps. The largest herniated disc was at L3-4 on the left. We did not perform intervertebral discectomies.  I then palpated along the ventral surface of the thecal sac and along exit route of the left L3, L4 and L5 nerve root and noted that the neural structures were well decompressed. This completed the decompression.  We then obtained hemostasis using bipolar electrocautery. We irrigated the wound out with bacitracin solution. We then removed the retractor. We then reapproximated the patient's thoracolumbar fascia with interrupted #1 Vicryl suture. We then reapproximated the patient's subcutaneous tissue with interrupted 2-0 Vicryl suture. We then reapproximated patient's skin with Steri-Strips and benzoin. The was then coated with bacitracin ointment. The drapes were removed. The patient was subsequently returned to the supine position where they were extubated by the anesthesia team. The patient was then transported to the postanesthesia care unit in stable condition. All sponge instrument and needle counts were reportedly correct at the end of this case.

## 2016-01-20 NOTE — Anesthesia Procedure Notes (Signed)
Procedure Name: Intubation Date/Time: 01/20/2016 3:06 PM Performed by: Charm Barges, Nijel Flink R Pre-anesthesia Checklist: Patient identified, Emergency Drugs available, Suction available and Patient being monitored Patient Re-evaluated:Patient Re-evaluated prior to inductionOxygen Delivery Method: Circle System Utilized Preoxygenation: Pre-oxygenation with 100% oxygen Intubation Type: IV induction Ventilation: Mask ventilation without difficulty Laryngoscope Size: Mac and 4 Grade View: Grade II Tube type: Oral Tube size: 8.0 mm Number of attempts: 1 Airway Equipment and Method: Stylet Placement Confirmation: ETT inserted through vocal cords under direct vision,  positive ETCO2 and breath sounds checked- equal and bilateral Secured at: 22 cm Tube secured with: Tape Dental Injury: Teeth and Oropharynx as per pre-operative assessment

## 2016-01-21 ENCOUNTER — Encounter (HOSPITAL_COMMUNITY): Payer: Self-pay | Admitting: Neurosurgery

## 2016-01-21 DIAGNOSIS — M5116 Intervertebral disc disorders with radiculopathy, lumbar region: Secondary | ICD-10-CM | POA: Diagnosis not present

## 2016-01-21 MED ORDER — DOCUSATE SODIUM 100 MG PO CAPS: 100.0000 mg | ORAL_CAPSULE | Freq: Two times a day (BID) | ORAL | 0 refills | Status: DC

## 2016-01-21 MED ORDER — CYCLOBENZAPRINE HCL 10 MG PO TABS: 10.0000 mg | ORAL_TABLET | Freq: Three times a day (TID) | ORAL | Status: DC | PRN

## 2016-01-21 MED ORDER — CYCLOBENZAPRINE HCL 10 MG PO TABS: 10.0000 mg | ORAL_TABLET | Freq: Three times a day (TID) | ORAL | 1 refills | Status: DC | PRN

## 2016-01-21 MED ORDER — OXYCODONE-ACETAMINOPHEN 5-325 MG PO TABS: 1.0000 | ORAL_TABLET | ORAL | 0 refills | Status: DC | PRN

## 2016-01-21 NOTE — Discharge Summary (Signed)
Physician Discharge Summary  Patient ID: Jordan Chang MRN: [id number] DOB/AGE: Jun 05, 1941 75 y.o.  Admit date: 01/20/2016 Discharge date: 01/21/2016  Admission Diagnoses:Left L2-3, L3-4 and L4-5 herniated disc, lumbago, lumbar radiculopathy  Discharge Diagnoses: The same Active Problems:   Lumbar herniated disc   Discharged Condition: good  Hospital Course: I performed a left L2-3, L3-4 and L4-5 discectomy on the patient on 01/20/2016. The surgery went well.  The patient's postoperative course was unremarkable. On postoperative day #1 the patient requested discharge home. The patient, and his wife, were given written and oral discharge instructions. All the questions were answered.  Consults: None Significant Diagnostic Studies: None Treatments: Left L2-3, L3-4 and L4-5 discectomy using microdissection Discharge Exam: Blood pressure (!) 116/45, pulse 87, temperature 99.3 F (37.4 C), temperature source Oral, resp. rate 20, height 6' (1.829 m), weight 124.7 kg (275 lb), SpO2 96 %. The patient is alert and pleasant. His dressing is clean and dry. His strength is normal in his lower extremities. He looks well.  Disposition: Home  Discharge Instructions    Call MD for:  difficulty breathing, headache or visual disturbances    Complete by:  As directed   Call MD for:  extreme fatigue    Complete by:  As directed   Call MD for:  hives    Complete by:  As directed   Call MD for:  persistant dizziness or light-headedness    Complete by:  As directed   Call MD for:  persistant nausea and vomiting    Complete by:  As directed   Call MD for:  redness, tenderness, or signs of infection (pain, swelling, redness, odor or green/yellow discharge around incision site)    Complete by:  As directed   Call MD for:  severe uncontrolled pain    Complete by:  As directed   Call MD for:  temperature >100.4    Complete by:  As directed   Diet - low sodium heart healthy    Complete by:  As directed    Discharge instructions    Complete by:  As directed   Call [phone] for a followup appointment. Take a stool softener while you are using pain medications.   Driving Restrictions    Complete by:  As directed   Do not drive for 2 weeks.   Increase activity slowly    Complete by:  As directed   Lifting restrictions    Complete by:  As directed   Do not lift more than 5 pounds. No excessive bending or twisting.   May shower / Bathe    Complete by:  As directed   He may shower after the pain she is removed 3 days after surgery. Leave the incision alone.   Remove dressing in 48 hours    Complete by:  As directed   Your stitches are under the scan and will dissolve by themselves. The Steri-Strips will fall off after you take a few showers. Do not rub back or pick at the wound, Leave the wound alone.       Medication List    TAKE these medications   aspirin EC 81 MG tablet Take 81 mg by mouth every morning.   atorvastatin 10 MG tablet Commonly known as:  LIPITOR Take 10 mg by mouth every other day.   cyclobenzaprine 10 MG tablet Commonly known as:  FLEXERIL Take 1 tablet (10 mg total) by mouth 3 (three) times daily as needed for muscle spasms.  D3-1000 PO Take 1,000 mcg by mouth daily.   VITAMIN D-1000 MAX ST 1000 units tablet Generic drug:  Cholecalciferol Take 2,000 Units by mouth daily.   docusate sodium 100 MG capsule Commonly known as:  COLACE Take 1 capsule (100 mg total) by mouth 2 (two) times daily.   gabapentin 300 MG capsule Commonly known as:  NEURONTIN Take 600 mg by mouth 3 (three) times daily.   GOODSENSE IBUPROFEN 200 MG tablet Generic drug:  ibuprofen Take 600 mg by mouth 3 (three) times daily.   hydrochlorothiazide 25 MG tablet Commonly known as:  HYDRODIURIL Take 25 mg by mouth daily.   losartan 50 MG tablet Commonly known as:  COZAAR Take 50 mg by mouth daily.   oxyCODONE-acetaminophen 5-325 MG tablet Commonly known as:   PERCOCET/ROXICET Take 1-2 tablets by mouth every 4 (four) hours as needed for moderate pain.   PEPCID 20 MG tablet Generic drug:  famotidine Take 20 mg by mouth daily as needed for heartburn or indigestion.   traMADol 50 MG tablet Commonly known as:  ULTRAM Take 1 tablet (50 mg total) by mouth every 6 (six) hours as needed. What changed:  how much to take  when to take this   triamterene-hydrochlorothiazide 37.5-25 MG capsule Commonly known as:  DYAZIDE Take 1 capsule by mouth daily.   vitamin B-12 1000 MCG tablet Commonly known as:  CYANOCOBALAMIN Take 1,000 mcg by mouth daily.        SignedCristi Loron 01/21/2016, 7:59 AM

## 2016-01-21 NOTE — Evaluation (Signed)
Physical Therapy Evaluation Patient Details Name: Jordan Chang MRN: [id number] DOB: 11/22/1940 Today's Date: 01/21/2016   History of Present Illness  75 yo male s/p L L2-L5 discectomy 7/27  Clinical Impression  On eval, pt required Min guard-Min assist for mobility. He walked ~100 feet with RW. Pt did report LE pain with increased activity. Practiced/reviewed bed mobility, ambulation, and stair negotiation. Pt somewhat resistant to instruction at times. Family present and encouraging pt to listen and comply. Recommend HHPT and RW use for ambulation. All education completed.     Follow Up Recommendations Home health PT;Supervision/Assistance - 24 hour    Equipment Recommendations  Rolling walker with 5" wheels    Recommendations for Other Services       Precautions / Restrictions Precautions Precautions: Fall;Back Precaution Booklet Issued: Yes (comment) Precaution Comments: Reviewed with pt and wife Restrictions Weight Bearing Restrictions: No      Mobility  Bed Mobility Overal bed mobility: Needs Assistance Bed Mobility: Sidelying to Sit   Sidelying to sit: Supervision       General bed mobility comments: pt performed technique well. no assist needed.  Transfers Overall transfer level: Needs assistance Equipment used: Rolling walker (2 wheeled) Transfers: Sit to/from Stand Sit to Stand: Min assist         General transfer comment: assist to rise, stabilize, control descent. VCs safety, technique, hand/LE placement  Ambulation/Gait Ambulation/Gait assistance: Min guard Ambulation Distance (Feet): 100 Feet Assistive device: Rolling walker (2 wheeled) Gait Pattern/deviations: Step-through pattern;Decreased stride length;Trunk flexed     General Gait Details: VCs safety, posture, distance from RW. Close guard for safety.   Stairs Stairs: Yes Stairs assistance: Min assist Stair Management: Step to pattern;Forwards;One rail Left Number of Stairs:  3 General stair comments: 1 rail, 1 HHA for stair negotiation. VCs safety, technique, seqeunce. Small amount of assist.   Wheelchair Mobility    Modified Rankin (Stroke Patients Only)       Balance Overall balance assessment: Needs assistance           Standing balance-Leahy Scale: Poor Standing balance comment: needs RW for stability/safe ambulation                             Pertinent Vitals/Pain Pain Assessment: Faces Faces Pain Scale: Hurts even more Pain Location: back, R/L LE Pain Descriptors / Indicators: Sore;Aching Pain Intervention(s): Monitored during session    Home Living Family/patient expects to be discharged to:: Private residence Living Arrangements: Spouse/significant other   Type of Home: House Home Access: Stairs to enter Entrance Stairs-Rails: Doctor, general practice of Steps: 2 Home Layout: One level Home Equipment: Cane - single point      Prior Function Level of Independence: Independent               Hand Dominance        Extremity/Trunk Assessment   Upper Extremity Assessment: Generalized weakness           Lower Extremity Assessment: Generalized weakness      Cervical / Trunk Assessment: Normal  Communication   Communication: HOH  Cognition Arousal/Alertness: Awake/alert Behavior During Therapy: WFL for tasks assessed/performed (resistant to instruction at times) Overall Cognitive Status: Within Functional Limits for tasks assessed                      General Comments      Exercises        Assessment/Plan  PT Assessment Patient needs continued PT services  PT Diagnosis Difficulty walking;Acute pain   PT Problem List Decreased activity tolerance;Decreased balance;Pain;Decreased mobility;Decreased knowledge of use of DME  PT Treatment Interventions DME instruction;Gait training;Functional mobility training;Stair training;Balance training;Therapeutic exercise;Therapeutic  activities   PT Goals (Current goals can be found in the Care Plan section) Acute Rehab PT Goals Patient Stated Goal: regain independence PT Goal Formulation: With patient/family Time For Goal Achievement: 02/04/16 Potential to Achieve Goals: Good    Frequency Min 5X/week   Barriers to discharge        Co-evaluation               End of Session   Activity Tolerance: Patient tolerated treatment well Patient left: in bed;with call bell/phone within reach;with family/visitor present      Functional Assessment Tool Used: clinical judgement Functional Limitation: Mobility: Walking and moving around Mobility: Walking and Moving Around Current Status ([id number]): At least 1 percent but less than 20 percent impaired, limited or restricted Mobility: Walking and Moving Around Goal Status [phone]): At least 1 percent but less than 20 percent impaired, limited or restricted Mobility: Walking and Moving Around Discharge Status [phone]): At least 1 percent but less than 20 percent impaired, limited or restricted    Time: 1022-1037 PT Time Calculation (min) (ACUTE ONLY): 15 min   Charges:   PT Evaluation $PT Eval Low Complexity: 1 Procedure     PT G Codes:   PT G-Codes **NOT FOR INPATIENT CLASS** Functional Assessment Tool Used: clinical judgement Functional Limitation: Mobility: Walking and moving around Mobility: Walking and Moving Around Current Status (V4944): At least 1 percent but less than 20 percent impaired, limited or restricted Mobility: Walking and Moving Around Goal Status [phone]): At least 1 percent but less than 20 percent impaired, limited or restricted Mobility: Walking and Moving Around Discharge Status [phone]): At least 1 percent but less than 20 percent impaired, limited or restricted    Rebeca Alert, MPT Pager: [phone]

## 2016-01-21 NOTE — Care Management Important Message (Signed)
Important Message  Patient Details  Name: Jordan Chang MRN: [id number] Date of Birth: Nov 17, 1940   Medicare Important Message Given:  Yes    Bernadette Hoit 01/21/2016, 10:35 AM

## 2016-01-21 NOTE — Care Management CC44 (Signed)
Condition Code 44 Documentation Completed  Patient Details  Name: MOSHE TURCHETTA MRN: [id number] Date of Birth: 09-20-1940   Condition Code 44 given:  Yes Patient signature on Condition Code 44 notice:  Yes Documentation of 2 MD's agreement:  Yes Code 44 added to claim:  Yes    Durenda Guthrie, RN 01/21/2016, 10:53 AM

## 2016-01-21 NOTE — Care Management Obs Status (Signed)
MEDICARE OBSERVATION STATUS NOTIFICATION   Patient Details  Name: DUVAL WINCH MRN: [id number] Date of Birth: [birth year]   Medicare Observation Status Notification Given:       Durenda Guthrie, RN 01/21/2016, 10:52 AM

## 2016-01-21 NOTE — Care Management Note (Signed)
Case Management Note  Patient Details  Name: Jordan Chang MRN: [id number] Date of Birth: 1941-05-26  Subjective/Objective:    75 yr old gentleman s/p L2-3,3-4,4-5 laminectomy.                Action/Plan: Case manager spoke with patient and family concerning need for Home Health and DME. Choice was offered for Home Health gency. Referral was called to Beatriz Stallion, [facility]. Patient will have family support at discharge.    Expected Discharge Date:    01/21/16              Expected Discharge Plan:  Home w Home Health Services  In-House Referral:  NA  Discharge planning Services  CM Consult  Post Acute Care Choice:  Durable Medical Equipment, Home Health Choice offered to:  Spouse  DME Arranged:  Dan Humphreys rolling DME Agency:  [facility]  HH Arranged:  PT [facility] Agency:  [facility]  Status of Service:  Completed, signed off  If discussed at Long Length of Stay Meetings, dates discussed:    Additional Comments:  Durenda Guthrie, RN 01/21/2016, 10:56 AM

## 2016-01-21 NOTE — Progress Notes (Signed)
Pt and wife given D/C instructions with Rx's, verbal understanding was provided. Pt's incision is clean and dry with no sign of infection. Pt's IV was removed prior to D/C. Pt D/C'd home via wheelchair @ 1210 per MD order. Pt is stable @ D/C and has no other needs at this time. Rema Fendt, RN

## 2016-01-21 NOTE — Discharge Instructions (Signed)

## 2016-01-21 NOTE — Care Management Obs Status (Signed)
MEDICARE OBSERVATION STATUS NOTIFICATION   Patient Details  Name: Jordan Chang MRN: [id number] Date of Birth: Nov 14, 1940   Medicare Observation Status Notification Given:  Yes    Durenda Guthrie, RN 01/21/2016, 10:53 AM

## 2016-01-22 ENCOUNTER — Observation Stay
Admission: EM | Admit: 2016-01-22 | Discharge: 2016-01-23 | Disposition: A | Discharge status: Skilled Nursing Facility | Payer: Commercial Managed Care - HMO | Attending: Specialist | Admitting: Specialist

## 2016-01-22 DIAGNOSIS — E785 Hyperlipidemia, unspecified: Secondary | ICD-10-CM | POA: Insufficient documentation

## 2016-01-22 DIAGNOSIS — K5900 Constipation, unspecified: Secondary | ICD-10-CM | POA: Diagnosis not present

## 2016-01-22 DIAGNOSIS — I452 Bifascicular block: Secondary | ICD-10-CM | POA: Diagnosis not present

## 2016-01-22 DIAGNOSIS — Z79899 Other long term (current) drug therapy: Secondary | ICD-10-CM | POA: Insufficient documentation

## 2016-01-22 DIAGNOSIS — G8929 Other chronic pain: Secondary | ICD-10-CM | POA: Diagnosis present

## 2016-01-22 DIAGNOSIS — R262 Difficulty in walking, not elsewhere classified: Secondary | ICD-10-CM | POA: Insufficient documentation

## 2016-01-22 DIAGNOSIS — I4510 Unspecified right bundle-branch block: Secondary | ICD-10-CM | POA: Insufficient documentation

## 2016-01-22 DIAGNOSIS — R531 Weakness: Secondary | ICD-10-CM | POA: Diagnosis present

## 2016-01-22 DIAGNOSIS — M545 Low back pain: Secondary | ICD-10-CM | POA: Insufficient documentation

## 2016-01-22 DIAGNOSIS — K219 Gastro-esophageal reflux disease without esophagitis: Secondary | ICD-10-CM | POA: Diagnosis not present

## 2016-01-22 DIAGNOSIS — G629 Polyneuropathy, unspecified: Secondary | ICD-10-CM | POA: Diagnosis not present

## 2016-01-22 DIAGNOSIS — M549 Dorsalgia, unspecified: Secondary | ICD-10-CM | POA: Diagnosis present

## 2016-01-22 DIAGNOSIS — Z7982 Long term (current) use of aspirin: Secondary | ICD-10-CM | POA: Diagnosis not present

## 2016-01-22 DIAGNOSIS — Z9849 Cataract extraction status, unspecified eye: Secondary | ICD-10-CM | POA: Diagnosis not present

## 2016-01-22 DIAGNOSIS — Z888 Allergy status to other drugs, medicaments and biological substances status: Secondary | ICD-10-CM | POA: Insufficient documentation

## 2016-01-22 DIAGNOSIS — I10 Essential (primary) hypertension: Secondary | ICD-10-CM | POA: Diagnosis not present

## 2016-01-22 DIAGNOSIS — G8918 Other acute postprocedural pain: Secondary | ICD-10-CM | POA: Diagnosis present

## 2016-01-22 HISTORY — DX: Hyperlipidemia, unspecified: E78.5

## 2016-01-22 LAB — CBC WITH DIFFERENTIAL/PLATELET
Basophils Absolute: 0.1 10*3/uL (ref 0–0.1)
Basophils Relative: 1 %
Eosinophils Absolute: 0 10*3/uL (ref 0–0.7)
Eosinophils Relative: 0 %
HEMATOCRIT: 39.7 % — AB (ref 40.0–52.0)
HEMOGLOBIN: 14.4 g/dL (ref 13.0–18.0)
LYMPHS ABS: 1.8 10*3/uL (ref 1.0–3.6)
MCH: 33 pg (ref 26.0–34.0)
MCHC: 36.1 g/dL — AB (ref 32.0–36.0)
MCV: 91.4 fL (ref 80.0–100.0)
Monocytes Absolute: 1.6 10*3/uL — ABNORMAL HIGH (ref 0.2–1.0)
NEUTROS ABS: 7.8 10*3/uL — AB (ref 1.4–6.5)
Platelets: 156 10*3/uL (ref 150–440)
RBC: 4.35 MIL/uL — AB (ref 4.40–5.90)
RDW: 13.1 % (ref 11.5–14.5)
WBC: 11.3 10*3/uL — AB (ref 3.8–10.6)

## 2016-01-22 LAB — BASIC METABOLIC PANEL
Anion gap: 8 (ref 5–15)
BUN: 27 mg/dL — ABNORMAL HIGH (ref 6–20)
CHLORIDE: 105 mmol/L (ref 101–111)
CO2: 25 mmol/L (ref 22–32)
Calcium: 9.5 mg/dL (ref 8.9–10.3)
Creatinine, Ser: 1.44 mg/dL — ABNORMAL HIGH (ref 0.61–1.24)
GFR calc non Af Amer: 46 mL/min — ABNORMAL LOW (ref >60)
GFR, EST AFRICAN AMERICAN: 53 mL/min — AB (ref >60)
Glucose, Bld: 146 mg/dL — ABNORMAL HIGH (ref 65–99)
POTASSIUM: 3.7 mmol/L (ref 3.5–5.1)
SODIUM: 138 mmol/L (ref 135–145)

## 2016-01-22 LAB — TROPONIN I: TROPONIN I: 0.04 ng/mL — AB (ref <0.03)

## 2016-01-22 MED ORDER — CHOLECALCIFEROL 25 MCG (1000 UT) PO TABS: ORAL_TABLET | Freq: Every day | ORAL | Status: DC

## 2016-01-22 MED ORDER — SODIUM CHLORIDE 0.9% FLUSH
3.0000 mL | Freq: Two times a day (BID) | INTRAVENOUS | Status: DC
  Administered 2016-01-22 – 2016-01-23 (×2): 3 mL via INTRAVENOUS

## 2016-01-22 MED ORDER — CYCLOBENZAPRINE HCL 10 MG PO TABS
10.0000 mg | ORAL_TABLET | Freq: Three times a day (TID) | ORAL | Status: DC | PRN
Start: 2016-01-22 — End: 2016-01-23

## 2016-01-22 MED ORDER — POLYETHYLENE GLYCOL 3350 17 G PO PACK: 17.0000 g | PACK | Freq: Every day | ORAL | Status: DC | PRN

## 2016-01-22 MED ORDER — SENNA 8.6 MG PO TABS
1.0000 | ORAL_TABLET | Freq: Two times a day (BID) | ORAL | Status: DC
  Administered 2016-01-22 – 2016-01-23 (×2): 8.6 mg via ORAL
  Filled 2016-01-22 (×2): qty 1

## 2016-01-22 MED ORDER — GABAPENTIN 300 MG PO CAPS
600.0000 mg | ORAL_CAPSULE | Freq: Three times a day (TID) | ORAL | Status: DC
  Administered 2016-01-22 – 2016-01-23 (×3): 600 mg via ORAL
  Filled 2016-01-22 (×3): qty 2

## 2016-01-22 MED ORDER — LOSARTAN POTASSIUM 25 MG PO TABS
50.0000 mg | ORAL_TABLET | Freq: Every day | ORAL | Status: DC
  Administered 2016-01-23: 50 mg via ORAL
  Filled 2016-01-22: qty 2

## 2016-01-22 MED ORDER — ONDANSETRON HCL 4 MG/2ML IJ SOLN
INTRAMUSCULAR | Status: AC
  Administered 2016-01-22: 4 mg
  Filled 2016-01-22: qty 2

## 2016-01-22 MED ORDER — VITAMIN B-12 1000 MCG PO TABS
1000.0000 ug | ORAL_TABLET | Freq: Every day | ORAL | Status: DC
  Administered 2016-01-23: 1000 ug via ORAL
  Filled 2016-01-22: qty 1

## 2016-01-22 MED ORDER — ONDANSETRON HCL 4 MG PO TABS: 4.0000 mg | ORAL_TABLET | Freq: Four times a day (QID) | ORAL | Status: DC | PRN

## 2016-01-22 MED ORDER — MORPHINE SULFATE (PF) 4 MG/ML IV SOLN
4.0000 mg | Freq: Once | INTRAVENOUS | Status: AC
  Administered 2016-01-22: 4 mg via INTRAVENOUS
  Filled 2016-01-22: qty 1

## 2016-01-22 MED ORDER — IBUPROFEN 600 MG PO TABS
600.0000 mg | ORAL_TABLET | Freq: Three times a day (TID) | ORAL | Status: DC
  Administered 2016-01-22 – 2016-01-23 (×3): 600 mg via ORAL
  Filled 2016-01-22 (×3): qty 1

## 2016-01-22 MED ORDER — ONDANSETRON HCL 4 MG/2ML IJ SOLN
4.0000 mg | Freq: Four times a day (QID) | INTRAMUSCULAR | Status: DC | PRN
Start: 2016-01-22 — End: 2016-01-23

## 2016-01-22 MED ORDER — HYDROCODONE-ACETAMINOPHEN 5-325 MG PO TABS: 1.0000 | ORAL_TABLET | ORAL | Status: DC | PRN

## 2016-01-22 MED ORDER — TRIAMTERENE-HCTZ 37.5-25 MG PO CAPS
1.0000 | ORAL_CAPSULE | Freq: Every day | ORAL | Status: DC
  Administered 2016-01-23: 1 via ORAL
  Filled 2016-01-22 (×2): qty 1

## 2016-01-22 MED ORDER — SODIUM CHLORIDE 0.9 % IV SOLN: 250.0000 mL | INTRAVENOUS | Status: DC | PRN

## 2016-01-22 MED ORDER — HYDROCHLOROTHIAZIDE 25 MG PO TABS: 25.0000 mg | ORAL_TABLET | Freq: Every day | ORAL | Status: DC

## 2016-01-22 MED ORDER — DOCUSATE SODIUM 100 MG PO CAPS
100.0000 mg | ORAL_CAPSULE | Freq: Two times a day (BID) | ORAL | Status: DC
  Administered 2016-01-22 – 2016-01-23 (×2): 100 mg via ORAL
  Filled 2016-01-22 (×2): qty 1

## 2016-01-22 MED ORDER — ENOXAPARIN SODIUM 40 MG/0.4ML ~~LOC~~ SOLN
40.0000 mg | SUBCUTANEOUS | Status: DC
  Administered 2016-01-22: 40 mg via SUBCUTANEOUS
  Filled 2016-01-22: qty 0.4

## 2016-01-22 MED ORDER — MORPHINE SULFATE (PF) 4 MG/ML IV SOLN
INTRAVENOUS | Status: AC
  Administered 2016-01-22: 4 mg
  Filled 2016-01-22: qty 1

## 2016-01-22 MED ORDER — ACETAMINOPHEN 325 MG PO TABS: 650.0000 mg | ORAL_TABLET | Freq: Four times a day (QID) | ORAL | Status: DC | PRN

## 2016-01-22 MED ORDER — ATORVASTATIN CALCIUM 20 MG PO TABS: 10.0000 mg | ORAL_TABLET | ORAL | Status: DC

## 2016-01-22 MED ORDER — ASPIRIN EC 81 MG PO TBEC
81.0000 mg | DELAYED_RELEASE_TABLET | ORAL | Status: DC
  Administered 2016-01-23: 81 mg via ORAL
  Filled 2016-01-22: qty 1

## 2016-01-22 MED ORDER — VITAMIN D 1000 UNITS PO TABS
2000.0000 [IU] | ORAL_TABLET | Freq: Every day | ORAL | Status: DC
  Administered 2016-01-23: 2000 [IU] via ORAL
  Filled 2016-01-22: qty 2

## 2016-01-22 MED ORDER — SODIUM CHLORIDE 0.9% FLUSH: 3.0000 mL | INTRAVENOUS | Status: DC | PRN

## 2016-01-22 MED ORDER — MORPHINE SULFATE (PF) 2 MG/ML IV SOLN: 1.0000 mg | INTRAVENOUS | Status: DC | PRN

## 2016-01-22 MED ORDER — FAMOTIDINE 20 MG PO TABS: 20.0000 mg | ORAL_TABLET | Freq: Every day | ORAL | Status: DC | PRN

## 2016-01-22 MED ORDER — ACETAMINOPHEN 650 MG RE SUPP: 650.0000 mg | Freq: Four times a day (QID) | RECTAL | Status: DC | PRN

## 2016-01-22 NOTE — ED Notes (Signed)
Pt transferred into bed with Beth, NT and 3 x RNs att

## 2016-01-22 NOTE — ED Notes (Signed)
Pt reports he fell to floor/slid to floor last night.

## 2016-01-22 NOTE — Care Management Obs Status (Signed)
MEDICARE OBSERVATION STATUS NOTIFICATION   Patient Details  Name: Jordan Chang MRN: [id number] Date of Birth: 09/02/1940   Medicare Observation Status Notification Given:  Yes    Caren Macadam, RN 01/22/2016, 6:33 PM

## 2016-01-22 NOTE — H&P (Signed)
[facility] - [place] at [facility]   PATIENT NAME: Jordan Chang    MR#:  [id number]  DATE OF BIRTH:  1941-06-16  DATE OF ADMISSION:  01/22/2016  PRIMARY CARE PHYSICIAN: Barbette Reichmann, MD   REQUESTING/REFERRING PHYSICIAN: Daryel November MD  CHIEF COMPLAINT:   Chief Complaint  Patient presents with  . Fall  . Back Pain    HISTORY OF PRESENT ILLNESS: Jordan Chang  is a 75 y.o. male with a known history of GERD, hyperlipidemia, hypertension who underwent surgery For his back at [facility] on 7/27 was discharged home the next day. Patient presents to the ED after feeling very weak and having pain in his back and difficulty with ambulation. Patient still has good strength in his lower extremity but he is just feeling weak and deconditioned. He is unable to use his walker properly and he almost fell yesterday. He is being admitted for rehabilitation placement as well as poor pain control overnight. He also complains of constipation.     PAST MEDICAL HISTORY:   Past Medical History:  Diagnosis Date  . GERD (gastroesophageal reflux disease)    occ  . Hyperlipemia   . Hypertension     PAST SURGICAL HISTORY:  Past Surgical History:  Procedure Laterality Date  . BACK SURGERY  05/2011  . EYE SURGERY     cataracts  . LUMBAR LAMINECTOMY/DECOMPRESSION MICRODISCECTOMY Left 01/20/2016   Procedure: Left Lumbar two-three, Lumbar three-four,  Lumbar four-five Microdiskectomy;  Surgeon: Tressie Stalker, MD;  Location: [facility];  Service: Neurosurgery;  Laterality: Left;    SOCIAL HISTORY:  Social History  Substance Use Topics  . Smoking status: Never Smoker  . Smokeless tobacco: Never Used  . Alcohol use Not on file    FAMILY HISTORY: No family history on file.  DRUG ALLERGIES:  Allergies  Allergen Reactions  . Other Other (See Comments)    STEROIDS, not recorded, caused tachycardia Pt "does not know name"    REVIEW OF SYSTEMS:    CONSTITUTIONAL: No fever,Positive fatigue and weakness.  EYES: No blurred or double vision.  EARS, NOSE, AND THROAT: No tinnitus or ear pain.  RESPIRATORY: No cough, shortness of breath, wheezing or hemoptysis.  CARDIOVASCULAR: No chest pain, orthopnea, edema.  GASTROINTESTINAL: No nausea, vomiting, diarrhea or abdominal pain.  GENITOURINARY: No dysuria, hematuria.  ENDOCRINE: No polyuria, nocturia,  HEMATOLOGY: No anemia, easy bruising or bleeding SKIN: No rash or lesion. MUSCULOSKELETAL: Positive back pain   NEUROLOGIC: No tingling, numbness, weakness.  PSYCHIATRY: No anxiety or depression.   MEDICATIONS AT HOME:  Prior to Admission medications   Medication Sig Start Date End Date Taking? Authorizing Provider  aspirin EC 81 MG tablet Take 81 mg by mouth every morning.     Historical Provider, MD  atorvastatin (LIPITOR) 10 MG tablet Take 10 mg by mouth every other day. 11/24/15 11/23/16  Historical Provider, MD  Cholecalciferol (D3-1000 PO) Take 1,000 mcg by mouth daily.     Historical Provider, MD  Cholecalciferol (VITAMIN D-1000 MAX ST) 1000 units tablet Take 2,000 Units by mouth daily.    Historical Provider, MD  cyclobenzaprine (FLEXERIL) 10 MG tablet Take 1 tablet (10 mg total) by mouth 3 (three) times daily as needed for muscle spasms. 01/21/16   Tressie Stalker, MD  docusate sodium (COLACE) 100 MG capsule Take 1 capsule (100 mg total) by mouth 2 (two) times daily. 01/21/16   Tressie Stalker, MD  gabapentin (NEURONTIN) 300 MG capsule Take 600 mg by mouth 3 (  three) times daily.     Historical Provider, MD  hydrochlorothiazide (HYDRODIURIL) 25 MG tablet Take 25 mg by mouth daily.    Historical Provider, MD  ibuprofen (GOODSENSE IBUPROFEN) 200 MG tablet Take 600 mg by mouth 3 (three) times daily.    Historical Provider, MD  losartan (COZAAR) 50 MG tablet Take 50 mg by mouth daily.    Historical Provider, MD  oxyCODONE-acetaminophen (PERCOCET/ROXICET) 5-325 MG tablet Take 1-2 tablets by  mouth every 4 (four) hours as needed for moderate pain. 01/21/16   Tressie Stalker, MD  PEPCID 20 MG tablet Take 20 mg by mouth daily as needed for heartburn or indigestion.  10/12/15   Historical Provider, MD  traMADol (ULTRAM) 50 MG tablet Take 1 tablet (50 mg total) by mouth every 6 (six) hours as needed. Patient taking differently: Take 100 mg by mouth 3 (three) times daily.  07/07/15   Tommi Rumps, PA-C  triamterene-hydrochlorothiazide (DYAZIDE) 37.5-25 MG capsule Take 1 capsule by mouth daily.    Historical Provider, MD  vitamin B-12 (CYANOCOBALAMIN) 1000 MCG tablet Take 1,000 mcg by mouth daily.    Historical Provider, MD      PHYSICAL EXAMINATION:   VITAL SIGNS: Blood pressure (!) 152/63, pulse (!) 107, temperature 98.2 F (36.8 C), temperature source Oral, resp. rate 17, height 6' (1.829 m), weight 124.7 kg (275 lb), SpO2 95 %.  GENERAL:  75 y.o.-year-old patient lying in the bed with no acute distress.  EYES: Pupils equal, round, reactive to light and accommodation. No scleral icterus. Extraocular muscles intact.  HEENT: Head atraumatic, normocephalic. Oropharynx and nasopharynx clear.  NECK:  Supple, no jugular venous distention. No thyroid enlargement, no tenderness.  LUNGS: Normal breath sounds bilaterally, no wheezing, rales,rhonchi or crepitation. No use of accessory muscles of respiration.  CARDIOVASCULAR: S1, S2 normal. No murmurs, rubs, or gallops.  ABDOMEN: Soft, nontender, nondistended. Bowel sounds present. No organomegaly or mass.  EXTREMITIES: No pedal edema, cyanosis, or clubbing.  NEUROLOGIC: Cranial nerves II through XII are intact. Muscle strength 5/5 in all extremities. Sensation intact. Gait not checked.  PSYCHIATRIC: The patient is alert and oriented x 3.  SKIN: No obvious rash, lesion, or ulcer. Incision looks good there is no drainage noted  LABORATORY PANEL:   CBC  Recent Labs Lab 01/17/16 0901 01/22/16 1304  WBC 6.2 11.3*  HGB 15.0 14.4  HCT  44.1 39.7*  PLT 182 156  MCV 92.3 91.4  MCH 31.4 33.0  MCHC 34.0 36.1*  RDW 12.8 13.1  LYMPHSABS  --  1.8  MONOABS  --  1.6*  EOSABS  --  0.0  BASOSABS  --  0.1   ------------------------------------------------------------------------------------------------------------------  Chemistries   Recent Labs Lab 01/17/16 0901  NA 137  K 4.1  CL 107  CO2 22  GLUCOSE 165*  BUN 24*  CREATININE 1.44*  CALCIUM 9.9   ------------------------------------------------------------------------------------------------------------------ estimated creatinine clearance is 59.5 mL/min (by C-G formula based on SCr of 1.44 mg/dL). ------------------------------------------------------------------------------------------------------------------ No results for input(s): TSH, T4TOTAL, T3FREE, THYROIDAB in the last 72 hours.  Invalid input(s): FREET3   Coagulation profile No results for input(s): INR, PROTIME in the last 168 hours. ------------------------------------------------------------------------------------------------------------------- No results for input(s): DDIMER in the last 72 hours. -------------------------------------------------------------------------------------------------------------------  Cardiac Enzymes No results for input(s): CKMB, TROPONINI, MYOGLOBIN in the last 168 hours.  Invalid input(s): CK ------------------------------------------------------------------------------------------------------------------ Invalid input(s): POCBNP  ---------------------------------------------------------------------------------------------------------------  Urinalysis    Component Value Date/Time   COLORURINE YELLOW (A) 07/07/2015 1311   APPEARANCEUR CLEAR (A) 07/07/2015 1311  LABSPEC 1.020 07/07/2015 1311   PHURINE 6.0 07/07/2015 1311   GLUCOSEU NEGATIVE 07/07/2015 1311   HGBUR NEGATIVE 07/07/2015 1311   BILIRUBINUR NEGATIVE 07/07/2015 1311   KETONESUR NEGATIVE  07/07/2015 1311   PROTEINUR NEGATIVE 07/07/2015 1311   NITRITE NEGATIVE 07/07/2015 1311   LEUKOCYTESUR NEGATIVE 07/07/2015 1311     RADIOLOGY: No results found.  EKG: Orders placed or performed during the hospital encounter of 01/22/16  . ED EKG  . ED EKG  . ED EKG  . ED EKG    IMPRESSION AND PLAN: Patient is a 75 year old white male recent back surgery with generalized weakness 1. Back pain and weakness At this time patient is pain control We'll ask physical therapy to see patient will need placement to rehabilitation social worker consult  2. Constipation start him on a bowel regimen  3. Hypertension continue his antihypertensives with HCTZ and losartan  4. GERD continue Pepcid  5. Miscellaneous we'll do Lovenox for DVT prophylaxis  All the records are reviewed and case discussed with ED provider. Management plans discussed with the patient, family and they are in agreement.  CODE STATUS:    Code Status Orders        Start     Ordered   01/22/16 1800  Full code  Continuous     01/22/16 1800    Code Status History    Date Active Date Inactive Code Status Order ID Comments User Context   01/20/2016  6:13 PM 01/21/2016  3:41 PM Full Code [id number]  Tressie Stalker, MD Inpatient       TOTAL TIME TAKING CARE OF THIS PATIENT: 50 minutes.    Auburn Bilberry M.D on 01/22/2016 at 6:03 PM  Between 7am to 6pm - Pager - [phone]  After 6pm go to [email or web address] - password [email or web address]  [facility]  Office  [phone]  CC: Primary care physician; Barbette Reichmann, MD

## 2016-01-22 NOTE — ED Triage Notes (Signed)
Pt arrives via [facility] from home  EMS reports that the pt experienced a fall to the floor yesterday and his wife was unable to get him up so EMS went out and assisted him- today they went back to his home due to increased pain to his back  Pt had back surgery on Thursday 01/20/2016 at [facility] and yesterday he was discharged to home with home health care physical therapy per family request.   The physical therapist arrived to his home this am and stated that the pt is too tall and big for her to be able to assist him.  Pt reports today that he needs rehab because the home care is not going to work for him and his family   Pt denies any injury with his fall yesterday

## 2016-01-22 NOTE — ED Notes (Signed)
Pt transferred into hospital bed at 1950 with Beth, NT and 3 x RNs from floor

## 2016-01-22 NOTE — [place] FL2 (Signed)
[place] MEDICAID FL2 LEVEL OF CARE SCREENING TOOL     IDENTIFICATION  Patient Name: Jordan Chang Birthdate: 1940/10/10 Sex: male Admission Date (Current Location): 01/22/2016  [place] and [place] Number:  Chiropodist and Address:  [facility], [address] [place], [place] [address]      Provider Number: [id number]  Attending Physician Name and Address:  Auburn Bilberry, MD  Relative Name and Phone Number:       Current Level of Care: Hospital Recommended Level of Care: Skilled Nursing Facility Prior Approval Number:    Date Approved/Denied:   PASRR Number: [id number]  Discharge Plan: SNF    Current Diagnoses: Patient Active Problem List   Diagnosis Date Noted  . Back pain 01/22/2016  . Lumbar herniated disc 01/20/2016    Orientation RESPIRATION BLADDER Height & Weight     Self, Time, Situation, Place  Normal Continent Weight: 275 lb (124.7 kg) Height:  6' (182.9 cm)  BEHAVIORAL SYMPTOMS/MOOD NEUROLOGICAL BOWEL NUTRITION STATUS      Continent    AMBULATORY STATUS COMMUNICATION OF NEEDS Skin   Extensive Assist Verbally Surgical wounds (Large incision on the back)                       Personal Care Assistance Level of Assistance  Bathing, Feeding, Dressing, Total care Bathing Assistance: Limited assistance Feeding assistance: Independent Dressing Assistance: Limited assistance Total Care Assistance: Limited assistance   Functional Limitations Info  Sight, Hearing, Speech Sight Info: Adequate Hearing Info: Impaired Speech Info: Adequate    SPECIAL CARE FACTORS FREQUENCY  PT (By licensed PT), OT (By licensed OT)     PT Frequency: x5 OT Frequency: x5            Contractures Contractures Info: Not present    Additional Factors Info  Code Status Code Status Info: full             Current Medications (01/22/2016):  This is the current hospital active medication list Current  Facility-Administered Medications  Medication Dose Route Frequency Provider Last Rate Last Dose  . 0.9 %  sodium chloride infusion  250 mL Intravenous PRN Auburn Bilberry, MD      . acetaminophen (TYLENOL) tablet 650 mg  650 mg Oral Q6H PRN Auburn Bilberry, MD       Or  . acetaminophen (TYLENOL) suppository 650 mg  650 mg Rectal Q6H PRN Auburn Bilberry, MD      . Melene Muller ON 01/23/2016] aspirin EC tablet 81 mg  81 mg Oral BH-q7a Auburn Bilberry, MD      . atorvastatin (LIPITOR) tablet 10 mg  10 mg Oral QODAY Auburn Bilberry, MD      . Cholecalciferol 2,000 Units  2,000 Units Oral Daily Auburn Bilberry, MD      . Cholecalciferol   Oral Daily Auburn Bilberry, MD      . cyclobenzaprine (FLEXERIL) tablet 10 mg  10 mg Oral TID PRN Auburn Bilberry, MD      . docusate sodium (COLACE) capsule 100 mg  100 mg Oral BID Auburn Bilberry, MD      . enoxaparin (LOVENOX) injection 40 mg  40 mg Subcutaneous Q24H Auburn Bilberry, MD      . famotidine (PEPCID) tablet 20 mg  20 mg Oral Daily PRN Auburn Bilberry, MD      . gabapentin (NEURONTIN) capsule 600 mg  600 mg Oral TID Auburn Bilberry, MD      . hydrochlorothiazide (HYDRODIURIL) tablet  25 mg  25 mg Oral Daily Auburn Bilberry, MD      . HYDROcodone-acetaminophen (NORCO/VICODIN) 5-325 MG per tablet 1-2 tablet  1-2 tablet Oral Q4H PRN Auburn Bilberry, MD      . ibuprofen (ADVIL,MOTRIN) tablet 600 mg  600 mg Oral TID Auburn Bilberry, MD      . losartan (COZAAR) tablet 50 mg  50 mg Oral Daily Auburn Bilberry, MD      . morphine 2 MG/ML injection 1 mg  1 mg Intravenous Q3H PRN Auburn Bilberry, MD      . ondansetron (ZOFRAN) tablet 4 mg  4 mg Oral Q6H PRN Auburn Bilberry, MD       Or  . ondansetron (ZOFRAN) injection 4 mg  4 mg Intravenous Q6H PRN Auburn Bilberry, MD      . polyethylene glycol (MIRALAX / GLYCOLAX) packet 17 g  17 g Oral Daily PRN Auburn Bilberry, MD      . senna (SENOKOT) tablet 8.6 mg  1 tablet Oral BID Auburn Bilberry, MD      . sodium chloride flush (NS) 0.9 %  injection 3 mL  3 mL Intravenous Q12H Auburn Bilberry, MD      . sodium chloride flush (NS) 0.9 % injection 3 mL  3 mL Intravenous PRN Auburn Bilberry, MD      . triamterene-hydrochlorothiazide (DYAZIDE) 37.5-25 MG per capsule 1 capsule  1 capsule Oral Daily Auburn Bilberry, MD      . vitamin B-12 (CYANOCOBALAMIN) tablet 1,000 mcg  1,000 mcg Oral Daily Auburn Bilberry, MD       Current Outpatient Prescriptions  Medication Sig Dispense Refill  . aspirin EC 81 MG tablet Take 81 mg by mouth every morning.     Marland Kitchen atorvastatin (LIPITOR) 10 MG tablet Take 10 mg by mouth every other day.    . Cholecalciferol (D3-1000 PO) Take 1,000 mcg by mouth daily.     . Cholecalciferol (VITAMIN D-1000 MAX ST) 1000 units tablet Take 2,000 Units by mouth daily.    . cyclobenzaprine (FLEXERIL) 10 MG tablet Take 1 tablet (10 mg total) by mouth 3 (three) times daily as needed for muscle spasms. 50 tablet 1  . docusate sodium (COLACE) 100 MG capsule Take 1 capsule (100 mg total) by mouth 2 (two) times daily. 60 capsule 0  . gabapentin (NEURONTIN) 300 MG capsule Take 600 mg by mouth 3 (three) times daily.     . hydrochlorothiazide (HYDRODIURIL) 25 MG tablet Take 25 mg by mouth daily.    Marland Kitchen ibuprofen (GOODSENSE IBUPROFEN) 200 MG tablet Take 600 mg by mouth 3 (three) times daily.    Marland Kitchen losartan (COZAAR) 50 MG tablet Take 50 mg by mouth daily.    Marland Kitchen oxyCODONE-acetaminophen (PERCOCET/ROXICET) 5-325 MG tablet Take 1-2 tablets by mouth every 4 (four) hours as needed for moderate pain. 50 tablet 0  . PEPCID 20 MG tablet Take 20 mg by mouth daily as needed for heartburn or indigestion.     . traMADol (ULTRAM) 50 MG tablet Take 1 tablet (50 mg total) by mouth every 6 (six) hours as needed. (Patient taking differently: Take 100 mg by mouth 3 (three) times daily. ) 20 tablet 0  . triamterene-hydrochlorothiazide (DYAZIDE) 37.5-25 MG capsule Take 1 capsule by mouth daily.    . vitamin B-12 (CYANOCOBALAMIN) 1000 MCG tablet Take 1,000 mcg by  mouth daily.       Discharge Medications: Please see discharge summary for a list of discharge medications.  Relevant Imaging Results:  Relevant  Lab Results:   Additional Information SSN [id number]  Cheron Schaumann, [place]

## 2016-01-22 NOTE — ED Provider Notes (Signed)
[facility] Emergency Department Provider Note ____________________________________________  Time seen: Approximately 12:45pm I have reviewed the triage vital signs and the triage nursing note.  HISTORY  Chief Complaint Fall and Back Pain   Historian Patient  HPI EVERRETT DAVIDOVICH is a 75 y.o. male had discectomy left (three segments) two days ago at [facility] by Dr. Lovell Sheehan, and was discharged yesterday on postop day 1 because he felt ready to go home. Patient states that once he got home he started having back pain which is not controlled by his by mouth medications. He's not sure what these are, but states that his wife will know. It sounds like it probably oxycodone.  Patient states that he has gotten up out of the bed 3 times a day but was severely painful. No weakness or numbness. No bowel or bladder incontinence. Pain is moderate and worse with movement.  No abdominal pain or urinary symptoms. _____  Wife and family member showed up and also state that he is essentially too weak to walk on his own. Home health physical therapy came to the hospital today and was unable to get him up walking on his own, he slid down and required EMS to lift him up. They're concerned that he is deconditioned and maintaining nursing home placement.   Past Medical History:  Diagnosis Date  . GERD (gastroesophageal reflux disease)    occ  . Hyperlipemia   . Hypertension     Patient Active Problem List   Diagnosis Date Noted  . Back pain 01/22/2016  . Lumbar herniated disc 01/20/2016    Past Surgical History:  Procedure Laterality Date  . BACK SURGERY  05/2011  . EYE SURGERY     cataracts  . LUMBAR LAMINECTOMY/DECOMPRESSION MICRODISCECTOMY Left 01/20/2016   Procedure: Left Lumbar two-three, Lumbar three-four,  Lumbar four-five Microdiskectomy;  Surgeon: Tressie Stalker, MD;  Location: [facility];  Service: Neurosurgery;  Laterality: Left;    Prior to Admission  medications   Medication Sig Start Date End Date Taking? Authorizing Provider  aspirin EC 81 MG tablet Take 81 mg by mouth every morning.    Yes Historical Provider, MD  atorvastatin (LIPITOR) 10 MG tablet Take 10 mg by mouth every other day. 11/24/15 11/23/16 Yes Historical Provider, MD  Cholecalciferol (D3-1000 PO) Take 1,000 mcg by mouth daily.    Yes Historical Provider, MD  cyclobenzaprine (FLEXERIL) 10 MG tablet Take 1 tablet (10 mg total) by mouth 3 (three) times daily as needed for muscle spasms. 01/21/16  Yes Tressie Stalker, MD  docusate sodium (COLACE) 100 MG capsule Take 1 capsule (100 mg total) by mouth 2 (two) times daily. 01/21/16  Yes Tressie Stalker, MD  gabapentin (NEURONTIN) 300 MG capsule Take 600 mg by mouth 3 (three) times daily.    Yes Historical Provider, MD  ibuprofen (GOODSENSE IBUPROFEN) 200 MG tablet Take 600 mg by mouth 3 (three) times daily.   Yes Historical Provider, MD  losartan (COZAAR) 50 MG tablet Take 50 mg by mouth daily.   Yes Historical Provider, MD  triamterene-hydrochlorothiazide (MAXZIDE-25) 37.5-25 MG tablet Take 1 tablet by mouth daily.   Yes Historical Provider, MD  vitamin B-12 (CYANOCOBALAMIN) 1000 MCG tablet Take 1,000 mcg by mouth daily.   Yes Historical Provider, MD  Cholecalciferol (VITAMIN D-1000 MAX ST) 1000 units tablet Take 2,000 Units by mouth daily.    Historical Provider, MD  oxyCODONE-acetaminophen (PERCOCET/ROXICET) 5-325 MG tablet Take 1-2 tablets by mouth every 4 (four) hours as needed  for moderate pain. 01/23/16   Houston Siren, MD  PEPCID 20 MG tablet Take 20 mg by mouth daily as needed for heartburn or indigestion.  10/12/15   Historical Provider, MD  traMADol (ULTRAM) 50 MG tablet Take 1 tablet (50 mg total) by mouth every 6 (six) hours as needed. 01/23/16   Houston Siren, MD    Allergies  Allergen Reactions  . Other Other (See Comments)    STEROIDS, not recorded, caused tachycardia Pt "does not know name"    History reviewed. No  pertinent family history.  Social History Social History  Substance Use Topics  . Smoking status: Never Smoker  . Smokeless tobacco: Never Used  . Alcohol use Not on file    Review of Systems  Constitutional: Negative for fever. Eyes: Negative for visual changes. ENT: Negative for sore throat. Cardiovascular: Negative for chest pain. Respiratory: Negative for shortness of breath. Gastrointestinal: Negative for abdominal pain, vomiting and diarrhea. Genitourinary: Negative for dysuria. Musculoskeletal: Positive for back pain. Skin: Negative for rash. Neurological: Negative for headache. 10 point Review of Systems otherwise negative ____________________________________________   PHYSICAL EXAM:  VITAL SIGNS: ED Triage Vitals  Enc Vitals Group     BP      Pulse      Resp      Temp      Temp src      SpO2      Weight      Height      Head Circumference      [place] Flow      Pain Score      Pain Loc      Pain Edu?      Excl. in GC?      Constitutional: Alert and oriented. Well appearing and in no distress. HEENT   Head: Normocephalic and atraumatic.      Eyes: Conjunctivae are normal. PERRL. Normal extraocular movements.      Ears:         Nose: No congestion/rhinnorhea.   Mouth/Throat: Mucous membranes are moist.   Neck: No stridor. Cardiovascular/Chest: Normal rate, regular rhythm.  No murmurs, rubs, or gallops. Respiratory: Normal respiratory effort without tachypnea nor retractions. Breath sounds are clear and equal bilaterally. No wheezes/rales/rhonchi. Gastrointestinal: Soft. No distention, no guarding, no rebound. Nontender.  Obese  Genitourinary/rectal:Deferred Musculoskeletal: Incision site on his back clean and dry Nontender with normal range of motion in all extremities. No joint effusions.  No lower extremity tenderness.  No edema. Neurologic:  Normal speech and language. No gross or focal neurologic deficits are appreciated. Skin:  Skin is  warm, dry and intact. No rash noted. Psychiatric: Mood and affect are normal. Speech and behavior are normal. Patient exhibits appropriate insight and judgment.  ____________________________________________   EKG I, Governor Rooks, MD, the attending physician have personally viewed and interpreted all ECGs.  None ____________________________________________  LABS (pertinent positives/negatives)  Labs Reviewed  BASIC METABOLIC PANEL - Abnormal; Notable for the following:       Result Value   Glucose, Bld 146 (*)    BUN 27 (*)    Creatinine, Ser 1.44 (*)    GFR calc non Af Amer 46 (*)    GFR calc Af Amer 53 (*)    All other components within normal limits  CBC WITH DIFFERENTIAL/PLATELET - Abnormal; Notable for the following:    WBC 11.3 (*)    RBC 4.35 (*)    HCT 39.7 (*)    MCHC 36.1 (*)  Neutro Abs 7.8 (*)    Monocytes Absolute 1.6 (*)    All other components within normal limits  TROPONIN I - Abnormal; Notable for the following:    Troponin I 0.04 (*)    All other components within normal limits    ____________________________________________  RADIOLOGY All Xrays were viewed by me. Imaging interpreted by Radiologist.  None __________________________________________  PROCEDURES  Procedure(s) performed: None  Critical Care performed: None  ____________________________________________   ED COURSE / ASSESSMENT AND PLAN  Pertinent labs & imaging results that were available during my care of the patient were reviewed by me and considered in my medical decision making (see chart for details).  This patient is now postop day 2 from multiple level lumbar discectomy on the left, and is here for pain control. No evidence of infectious or other complication on physical exam.  IV pain control provided.  One dose of morphine did not provide adequate control.  On reevaluation patient was still in significant pain and family showed up and stated that the patient was  essentially unable to stand or bear his own weight either due to pain or deconditioning. They stated they are interested in the patient going to some sort of a acute care rehabilitation facility.  I made a consult with social work to evaluate this possibility.  Patient was given additional dose of morphine for continued pain. Given the weakness complaint, I am also adding on laboratory studies and EKG.    CONSULTATIONS:  None   Patient / Family / Caregiver informed of clinical course, medical decision-making process, and agree with plan.   I discussed return precautions, follow-up instructions, and discharged instructions with patient and/or family.   ___________________________________________   FINAL CLINICAL IMPRESSION(S) / ED DIAGNOSES   Final diagnoses:  Post-operative pain  Weakness              Note: This dictation was prepared with Dragon dictation. Any transcriptional errors that result from this process are unintentional    Governor Rooks, MD 01/25/16 1521

## 2016-01-22 NOTE — Progress Notes (Signed)
LCSW has completed Fl2 and assessment to have patient placed in a SNF. They have  [employer] [facility] insurance and LCSW is awaiting PT and OT consult. Will resume work in the morning. [employer] LCSW [phone]

## 2016-01-22 NOTE — Clinical Social Work Note (Signed)
Clinical Social Work Assessment  Patient Details  Name: Jordan Chang MRN: [id number] Date of Birth: 1940-07-27  Date of referral:  01/22/16               Reason for consult:  Facility Placement                Permission sought to share information with:  Family Supports, Customer service manager Permission granted to share information::  Yes, Verbal Permission Granted  Name::     [phone] [name]  Agency::  All facilities  Relationship::  yes  Contact Information:  [phone] [name]  Housing/Transportation Living arrangements for the past 2 months:  [place] of Information:  Patient, Spouse Patient Interpreter Needed:  None Criminal Activity/Legal Involvement Pertinent to Current Situation/Hospitalization:  No - Comment as needed Significant Relationships:  Adult Children, Spouse Lives with:  Spouse Do you feel safe going back to the place where you live?  Yes Need for family participation in patient care:  Yes (Comment)  Care giving concerns:  Patient is unable to manage PT at home and fell and unable to walk at this time   Facilities manager / plan: LCSW met with patient and obtained verbal consent to speak to family and facilities. Oriented x4. Had back surgery at [facility] on Thursday and DC on Friday and unable to stand or walk. Family is very supportive however they together are  unable to move/lift patient today when he fell. Patient reports he will need assistance with bathing,  And hygiene and dressing. He will be able to feed  Himself. Patient uses a hearing aid and has a normal diet. He does not require )2 and has a big incision on his back. Patient has [employer]- called amy for authorization.  Employment status:  Retired Forensic scientist:  Medicare ([place]) PT Recommendations:  [place] / Referral to community resources:  [place]  Patient/Family's Response to  care:  Wants patient  In SNF for short term rehab.  Patient/Family's Understanding of and Emotional Response to Diagnosis, Current Treatment, and Prognosis:  He is in terrible pain and unable to walk and needs a lot more support  Emotional Assessment Appearance:  Appears stated age Attitude/Demeanor/Rapport:  Complaining (In alot of pain) Affect (typically observed):  Adaptable, Accepting, Hopeful Orientation:  Oriented to Self, Oriented to Place, Oriented to  Time, Oriented to Situation Alcohol / Substance use:  Never Used Psych involvement (Current and /or in the community):  No (Comment)  Discharge Needs  Concerns to be addressed:  Adjustment to Illness Readmission within the last 30 days:  No Current discharge risk:  None Barriers to Discharge:  [place], [place] 01/22/2016, 5:50 PM

## 2016-01-23 MED ORDER — TRAMADOL HCL 50 MG PO TABS: 50.0000 mg | ORAL_TABLET | Freq: Four times a day (QID) | ORAL | 0 refills | Status: DC | PRN

## 2016-01-23 MED ORDER — OXYCODONE-ACETAMINOPHEN 5-325 MG PO TABS: 1.0000 | ORAL_TABLET | ORAL | 0 refills | Status: DC | PRN

## 2016-01-23 NOTE — Progress Notes (Signed)
Patient is medically stable for D/C to [facility] today. Per Jomarie Longs [facility] liaison patient will go to room 205-B. RN will call report to [address] RN at [phone] and arrange EMS for transport. [facility] authorization received. Auth # [id number]. Clinical Child psychotherapist (CSW) sent D/C orders in the [employer] to [place]. Patient and his wife [name] are aware of above. Please reconsult if future social work needs arise. CSW signing off.   [employer], LCSW [phone]

## 2016-01-23 NOTE — Evaluation (Signed)
Physical Therapy Evaluation Patient Details Name: Jordan Chang MRN: [id number] DOB: May 29, 1941 Today's Date: 01/23/2016   History of Present Illness  75 yo male s/p L L2-L5 discectomy 7/27, he went home post surgery and was having a hard time managing at home, had a fall and needed EMT and ultimately coming to the hospital.  Clinical Impression  Pt shows good effort with bed mobility and ambulation but is weak, pain limited and ultimately is not safe to return home.  He is not able to go from supine to sit w/o assist and needed a lot of cuing and raised surface to get to standing.  He reports that his low back pain is not too bad today, but has been having L hip/thigh pain.      Follow Up Recommendations SNF    Equipment Recommendations       Recommendations for Other Services       Precautions / Restrictions Precautions Precautions: Fall;Back Restrictions Weight Bearing Restrictions: No      Mobility  Bed Mobility Overal bed mobility: Needs Assistance Bed Mobility: Supine to Sit   Sidelying to sit: Mod assist;Min assist       General bed mobility comments: Pt does well with log roll, but struggles to get up to sitting and needs VCs and direct phyiscal assist  Transfers Overall transfer level: Needs assistance Equipment used: Rolling walker (2 wheeled) Transfers: Sit to/from Stand Sit to Stand: Min assist         General transfer comment: Pt attempted to get to standing from bed and needed a lot of cuing for hand placement and set up, he was initially unable to rise from bed and did need it elevated.  Pt struggles to attain fully upright posture.  Ambulation/Gait Ambulation/Gait assistance: Min guard Ambulation Distance (Feet): 40 Feet Assistive device: Rolling walker (2 wheeled)       General Gait Details: Pt with moderate limping on the L, has forward flexed posture with heavy reliance on the walker and fatigues very quickly with minimal effort.    Stairs            Wheelchair Mobility    Modified Rankin (Stroke Patients Only)       Balance                                             Pertinent Vitals/Pain Pain Assessment:  (not rated, reports minimal back pain but L hip/thigh pain)    Home Living Family/patient expects to be discharged to:: Skilled nursing facility Living Arrangements: Spouse/significant other   Type of Home: House Home Access: Stairs to enter Entrance Stairs-Rails: Doctor, general practice of Steps: 2 Home Layout: One level Home Equipment: Cane - single point;Walker - 2 wheels      Prior Function Level of Independence: Independent               Hand Dominance        Extremity/Trunk Assessment   Upper Extremity Assessment: Generalized weakness;Overall [facility] for tasks assessed           Lower Extremity Assessment: Generalized weakness;Overall WFL for tasks assessed         Communication   Communication: No difficulties  Cognition Arousal/Alertness: Awake/alert Behavior During Therapy: WFL for tasks assessed/performed Overall Cognitive Status: Within Functional Limits for tasks assessed  General Comments      Exercises        Assessment/Plan    PT Assessment Patient needs continued PT services  PT Diagnosis Difficulty walking;Acute pain   PT Problem List Decreased activity tolerance;Decreased balance;Pain;Decreased mobility;Decreased knowledge of use of DME  PT Treatment Interventions DME instruction;Gait training;Functional mobility training;Stair training;Balance training;Therapeutic exercise;Therapeutic activities   PT Goals (Current goals can be found in the Care Plan section) Acute Rehab PT Goals Patient Stated Goal: get stronger and walk better PT Goal Formulation: With patient/family Time For Goal Achievement: 02/05/16 Potential to Achieve Goals: Fair    Frequency Min 2X/week   Barriers to  discharge        Co-evaluation               End of Session Equipment Utilized During Treatment: Gait belt Activity Tolerance: Patient limited by pain;Patient limited by fatigue Patient left: with bed alarm set;with call bell/phone within reach;with family/visitor present      Functional Assessment Tool Used: clinical judgement Functional Limitation: Mobility: Walking and moving around Mobility: Walking and Moving Around Current Status (H2992): At least 40 percent but less than 60 percent impaired, limited or restricted Mobility: Walking and Moving Around Goal Status [phone]): At least 1 percent but less than 20 percent impaired, limited or restricted    Time: 0828-0849 PT Time Calculation (min) (ACUTE ONLY): 21 min   Charges:   PT Evaluation $PT Eval Low Complexity: 1 Procedure     PT G Codes:   PT G-Codes **NOT FOR INPATIENT CLASS** Functional Assessment Tool Used: clinical judgement Functional Limitation: Mobility: Walking and moving around Mobility: Walking and Moving Around Current Status (M1962): At least 40 percent but less than 60 percent impaired, limited or restricted Mobility: Walking and Moving Around Goal Status [phone]): At least 1 percent but less than 20 percent impaired, limited or restricted    Malachi Pro, DPT 01/23/2016, 9:24 AM

## 2016-01-23 NOTE — Progress Notes (Signed)
LCSW awaiting OT and PT consult unable to obtain [facility] authorization until these are complete.  [employer] LCSW [phone]

## 2016-01-23 NOTE — Clinical Social Work Placement (Signed)
   CLINICAL SOCIAL WORK PLACEMENT  NOTE  Date:  01/23/2016  Patient Details  Name: Jordan Chang MRN: [id number] Date of Birth: 10-25-1940  Clinical Social Work is seeking post-discharge placement for this patient at the Skilled  Nursing Facility level of care (*CSW will initial, date and re-position this form in  chart as items are completed):  Yes   Patient/family provided with [place] Clinical Social Work Department's list of facilities offering this level of care within the geographic area requested by the patient (or if unable, by the patient's family).  Yes   Patient/family informed of their freedom to choose among providers that offer the needed level of care, that participate in Medicare, Medicaid or managed care program needed by the patient, have an available bed and are willing to accept the patient.  Yes   Patient/family informed of [place]'s ownership interest in [facility] and [facility], as well as of the fact that they are under no obligation to receive care at these facilities.  PASRR submitted to EDS on 01/22/16     PASRR number received on 01/22/16     Existing PASRR number confirmed on       FL2 transmitted to all facilities in geographic area requested by pt/family on 01/23/16     FL2 transmitted to all facilities within larger geographic area on       Patient informed that his/her managed care company has contracts with or will negotiate with certain facilities, including the following:        Yes   Patient/family informed of bed offers received.  Patient chooses bed at  ([facility] )     Physician recommends and patient chooses bed at      Patient to be transferred to   on  .  Patient to be transferred to facility by       Patient family notified on   of transfer.  Name of family member notified:        PHYSICIAN       Additional Comment:    _______________________________________________ Denasia Venn, Darleen Crocker, LCSW 01/23/2016, 10:30  AM

## 2016-01-23 NOTE — Evaluation (Signed)
Occupational Therapy Evaluation Patient Details Name: Jordan Chang MRN: [id number] DOB: 06/10/1941 Today's Date: 01/23/2016    History of Present Illness 75 yo male s/p L L2-L5 discectomy 7/27, he went home post surgery and was having a hard time managing at home, had a fall and needed EMT and ultimately coming to the hospital.   Clinical Impression   Pt seen in room sitting EOB after PT session with his wife and then son and his wife to discuss status and recommendations for AADLs to prevent any further falls and increase independence within current limitations after back surgery.  He requires set up only for bathing UB and for grooming and hygiene but max to total assist for LB dressing due to decreased trunk flexion and pain after back surgery.  His wife has been tying his shoes and would benefit from use of elastic shoe laces, LH shoe horn, reacher and sock aid to increase independence and safety for ADLs.  Reviewed set up at home to decease risk of falls such as using BSC over toilet but the toilet closest to the bedroom does not have enough room but the one down the hall does.  Also rec using a transfet tub bench for showering so he does not have to step over side of tub.  Gave them a AD catlaog to review with items rec discussed. Pt's wife used to work at [employer] and is familiar with AD discussed.  Rec continued OT in hospital and then SNF to regain strength and improve functional moblity and independence in ADLs using AD.        Follow Up Recommendations  SNF    Equipment Recommendations  3 in 1 bedside comode;Tub/shower bench (elastic shoe laces, reacher, LH shoe horn, sock aid)    Recommendations for Other Services       Precautions / Restrictions Precautions Precautions: Fall;Back Precaution Comments: Back precautions reviewed by PT Restrictions Weight Bearing Restrictions: No      Mobility Bed Mobility Overal bed mobility: Needs Assistance Bed Mobility: Supine to  Sit   Sidelying to sit: Mod assist;Min assist       General bed mobility comments: Pt does well with log roll, but struggles to get up to sitting and needs VCs and direct phyiscal assist  Transfers Overall transfer level: Needs assistance Equipment used: Rolling walker (2 wheeled) Transfers: Sit to/from Stand Sit to Stand: Min assist            Balance                                            ADL Overall ADL's : Needs assistance/impaired                                       General ADL Comments: Pt sitting EOB after PT session.  He requires set up only for bathing UB and for grooming and hygiene but max to total assist for LB dressing due to decreased trunk flexion and pain after back surgery.  His wife has been tying his shoes and would benefit from use of elastic shoe laces, LH shoe horn, reacher and sock aid to increase independence and safety for ADLs.  Reviewed set up at home to decease risk of falls such as using BSC over  toilet but the toilet closest to the bedroom does not have enough room but the one down the hall does.  Also rec using a transfet tub bench for showering so he does not have to step over side of tub.  Gave them a AD catlaog to review with items rec discussed. Pt's wife used to work at [employer] and is familiar with AD discussed.      Vision     Perception     Praxis      Pertinent Vitals/Pain Pain Assessment: 0-10 Pain Score: 5  Pain Location: back, both LEs Pain Descriptors / Indicators: Aching;Sore;Spasm Pain Intervention(s): Limited activity within patient's tolerance;Monitored during session     Hand Dominance Right   Extremity/Trunk Assessment Upper Extremity Assessment Upper Extremity Assessment: Overall WFL for tasks assessed   Lower Extremity Assessment Lower Extremity Assessment: Defer to PT evaluation   Cervical / Trunk Assessment Cervical / Trunk Assessment: Normal   Communication  Communication Communication: HOH   Cognition Arousal/Alertness: Awake/alert Behavior During Therapy: WFL for tasks assessed/performed Overall Cognitive Status: Within Functional Limits for tasks assessed                     General Comments       Exercises       Shoulder Instructions      Home Living Family/patient expects to be discharged to:: Skilled nursing facility Living Arrangements: Spouse/significant other   Type of Home: House Home Access: Stairs to enter [employer] of Steps: 2 Entrance Stairs-Rails: Right;Left Home Layout: One level               Home Equipment: Cane - single point;Walker - 2 wheels          Prior Functioning/Environment Level of Independence: Independent             OT Diagnosis: Acute pain   OT Problem List: Decreased strength;Decreased range of motion;Decreased activity tolerance;Impaired balance (sitting and/or standing);Decreased safety awareness;Pain   OT Treatment/Interventions: Self-care/ADL training;Therapeutic activities;DME and/or AE instruction;Patient/family education    OT Goals(Current goals can be found in the care plan section) Acute Rehab OT Goals Patient Stated Goal: to get stronger and do more for myself again OT Goal Formulation: With patient/family Time For Goal Achievement: 02/06/16 Potential to Achieve Goals: Good ADL Goals Pt Will Perform Lower Body Dressing: with set-up;with adaptive equipment;with min assist;sit to/from stand (using AD sitting in chair and no LOB standing for pants ) Pt Will Transfer to Toilet: with min assist;stand pivot transfer;bedside commode (BSC over toilet)  OT Frequency: Min 1X/week   Barriers to D/C:            Co-evaluation              End of Session    Activity Tolerance: Patient tolerated treatment well Patient left: in bed;with call bell/phone within reach;with family/visitor present (CNA to come in to do sponge bath and was aware not to  get up out of bed without using call bell)   Time: 4158-3094 OT Time Calculation (min): 30 min Charges:  OT General Charges $OT Visit: 1 Procedure OT Evaluation $OT Eval Moderate Complexity: 1 Procedure OT Treatments $Self Care/Home Management : 8-22 mins G-Codes: OT G-codes **NOT FOR INPATIENT CLASS** Functional Limitation: Self care Self Care Current Status (M7680): At least 20 percent but less than 40 percent impaired, limited or restricted Self Care Goal Status (S8110): At least 1 percent but less than 20 percent impaired, limited or  restricted   Susanne Borders, OTR/L ascom [phone] 01/23/2016, 10:08 AM

## 2016-01-23 NOTE — Progress Notes (Signed)
PT is recommending SNF. Clinical Social Worker (CSW) met with patient and his wife Arbie Cookey at bedside. CSW presented bed offers. Patient and wife chose [facility]. CSW explained that [facility] will have to approve SNF stay. Patient and wife verbalized their understanding. Weekend [facility] case manager has been notified. CSW will continue to follow and assist as needed.   [employer], LCSW [phone]

## 2016-01-23 NOTE — Clinical Social Work Placement (Signed)
   CLINICAL SOCIAL WORK PLACEMENT  NOTE  Date:  01/23/2016  Patient Details  Name: Jordan Chang MRN: [id number] Date of Birth: 1941/05/17  Clinical Social Work is seeking post-discharge placement for this patient at the Skilled  Nursing Facility level of care (*CSW will initial, date and re-position this form in  chart as items are completed):  Yes   Patient/family provided with [place] Clinical Social Work Department's list of facilities offering this level of care within the geographic area requested by the patient (or if unable, by the patient's family).  Yes   Patient/family informed of their freedom to choose among providers that offer the needed level of care, that participate in Medicare, Medicaid or managed care program needed by the patient, have an available bed and are willing to accept the patient.  Yes   Patient/family informed of [place]'s ownership interest in [facility] and [facility], as well as of the fact that they are under no obligation to receive care at these facilities.  PASRR submitted to EDS on 01/22/16     PASRR number received on 01/22/16     Existing PASRR number confirmed on       FL2 transmitted to all facilities in geographic area requested by pt/family on 01/23/16     FL2 transmitted to all facilities within larger geographic area on       Patient informed that his/her managed care company has contracts with or will negotiate with certain facilities, including the following:        Yes   Patient/family informed of bed offers received.  Patient chooses bed at  ([facility] )     Physician recommends and patient chooses bed at  ([facility])    Patient to be transferred to  ([facility] Room 205B) on 01/23/16.  Patient to be transferred to facility by  [facility] )     Patient family notified on 01/23/16 of transfer.  Name of family member notified:  Wilman Scala (spouse)     PHYSICIAN       Additional Comment:     _______________________________________________ Judi Cong, LCSW 01/23/2016, 1:31 PM

## 2016-01-23 NOTE — Discharge Summary (Signed)
[facility] - [place] at [facility]   PATIENT NAME: Jordan Chang    MR#:  [id number]  DATE OF BIRTH:  [birth year]  DATE OF ADMISSION:  01/22/2016 ADMITTING PHYSICIAN: Auburn Bilberry, MD  DATE OF DISCHARGE: 01/23/2016  PRIMARY CARE PHYSICIAN: Barbette Reichmann, MD    ADMISSION DIAGNOSIS:  Weakness [R53.1] Post-operative pain [G89.18]  DISCHARGE DIAGNOSIS:  Active Problems:   Back pain   SECONDARY DIAGNOSIS:   Past Medical History:  Diagnosis Date  . GERD (gastroesophageal reflux disease)    occ  . Hyperlipemia   . Hypertension     HOSPITAL COURSE:   75 year old male with past medical history of hypertension, hyperlipidemia, GERD, chronic back pain who had recent intravertebral discectomy with microdissection of the L4-L5 L2-L3 and L3-L4 area, who presented to the hospital due to worsening back pain.  1. Back pain/difficulty walking- pt. Is s/p back surgery 2 days ago and was admitted to hospital due to back pain and difficulty ambulating.  He was discharged home with home health services postoperatively, although when he got home he was not able to improve with the help of walker and therefore was readmitted for pain control. -His pain is improved and he was followed by physical therapy who thought he would benefit from short-term rehabilitation. He is presently being discharged therefore further physical therapy. -He will continue tramadol, oxycodone as needed for his back pain.  2. Essential hypertension-patient will continue his losartan/triamterene HCTZ.  3. Hyperlipidemia-patient will continue his atorvastatin.  4. Neuropathy-patient will continue his Neurontin.  DISCHARGE CONDITIONS:   Stable.   CONSULTS OBTAINED:    DRUG ALLERGIES:   Allergies  Allergen Reactions  . Other Other (See Comments)    STEROIDS, not recorded, caused tachycardia Pt "does not know name"    DISCHARGE MEDICATIONS:   Current Discharge Medication List     CONTINUE these medications which have CHANGED   Details  oxyCODONE-acetaminophen (PERCOCET/ROXICET) 5-325 MG tablet Take 1-2 tablets by mouth every 4 (four) hours as needed for moderate pain. Qty: 50 tablet, Refills: 0    traMADol (ULTRAM) 50 MG tablet Take 1 tablet (50 mg total) by mouth every 6 (six) hours as needed. Qty: 20 tablet, Refills: 0      CONTINUE these medications which have NOT CHANGED   Details  aspirin EC 81 MG tablet Take 81 mg by mouth every morning.     atorvastatin (LIPITOR) 10 MG tablet Take 10 mg by mouth every other day.    !! Cholecalciferol (D3-1000 PO) Take 1,000 mcg by mouth daily.     cyclobenzaprine (FLEXERIL) 10 MG tablet Take 1 tablet (10 mg total) by mouth 3 (three) times daily as needed for muscle spasms. Qty: 50 tablet, Refills: 1    docusate sodium (COLACE) 100 MG capsule Take 1 capsule (100 mg total) by mouth 2 (two) times daily. Qty: 60 capsule, Refills: 0    gabapentin (NEURONTIN) 300 MG capsule Take 600 mg by mouth 3 (three) times daily.     ibuprofen (GOODSENSE IBUPROFEN) 200 MG tablet Take 600 mg by mouth 3 (three) times daily.    losartan (COZAAR) 50 MG tablet Take 50 mg by mouth daily.    triamterene-hydrochlorothiazide (MAXZIDE-25) 37.5-25 MG tablet Take 1 tablet by mouth daily.    vitamin B-12 (CYANOCOBALAMIN) 1000 MCG tablet Take 1,000 mcg by mouth daily.    !! Cholecalciferol (VITAMIN D-1000 MAX ST) 1000 units tablet Take 2,000 Units by mouth daily.    PEPCID 20 MG tablet Take 20  mg by mouth daily as needed for heartburn or indigestion.      !! - Potential duplicate medications found. Please discuss with provider.    STOP taking these medications     triamterene-hydrochlorothiazide (DYAZIDE) 37.5-25 MG capsule          DISCHARGE INSTRUCTIONS:   DIET:  Cardiac diet  DISCHARGE CONDITION:  Stable  ACTIVITY:  Activity as tolerated  OXYGEN:  Home Oxygen: No.   Oxygen Delivery: room air  DISCHARGE LOCATION:   nursing home   If you experience worsening of your admission symptoms, develop shortness of breath, life threatening emergency, suicidal or homicidal thoughts you must seek medical attention immediately by calling 911 or calling your MD immediately  if symptoms less severe.  You Must read complete instructions/literature along with all the possible adverse reactions/side effects for all the Medicines you take and that have been prescribed to you. Take any new Medicines after you have completely understood and accpet all the possible adverse reactions/side effects.   Please note  You were cared for by a hospitalist during your hospital stay. If you have any questions about your discharge medications or the care you received while you were in the hospital after you are discharged, you can call the unit and asked to speak with the hospitalist on call if the hospitalist that took care of you is not available. Once you are discharged, your primary care physician will handle any further medical issues. Please note that NO REFILLS for any discharge medications will be authorized once you are discharged, as it is imperative that you return to your primary care physician (or establish a relationship with a primary care physician if you do not have one) for your aftercare needs so that they can reassess your need for medications and monitor your lab values.     Today   Still having some back pain but improved since yesterday. Worked well with PT this a.m. Wife at bedside.   VITAL SIGNS:  Blood pressure (!) 129/55, pulse 82, temperature 98.4 F (36.9 C), temperature source Oral, resp. rate 19, height 6' (1.829 m), weight 124.7 kg (275 lb), SpO2 96 %.  I/O:   Intake/Output Summary (Last 24 hours) at 01/23/16 1145 Last data filed at 01/23/16 0921  Gross per 24 hour  Intake              240 ml  Output              275 ml  Net              -35 ml    PHYSICAL EXAMINATION:  GENERAL:  75  y.o.-year-old patient sitting up on side of bed in NAD.   EYES: Pupils equal, round, reactive to light and accommodation. No scleral icterus. Extraocular muscles intact.  HEENT: Head atraumatic, normocephalic. Oropharynx and nasopharynx clear.  NECK:  Supple, no jugular venous distention. No thyroid enlargement, no tenderness.  LUNGS: Normal breath sounds bilaterally, no wheezing, rales,rhonchi. No use of accessory muscles of respiration.  CARDIOVASCULAR: S1, S2 normal. No murmurs, rubs, or gallops.  ABDOMEN: Soft, non-tender, non-distended. Bowel sounds present. No organomegaly or mass.  EXTREMITIES: No pedal edema, cyanosis, or clubbing.  NEUROLOGIC: Cranial nerves II through XII are intact. No focal motor or sensory defecits b/l.  PSYCHIATRIC: The patient is alert and oriented x 3. Good affect.  SKIN: No obvious rash, lesion, or ulcer.   DATA REVIEW:   CBC  Recent Labs Lab 01/22/16 1304  WBC 11.3*  HGB 14.4  HCT 39.7*  PLT 156    Chemistries   Recent Labs Lab 01/22/16 1304  NA 138  K 3.7  CL 105  CO2 25  GLUCOSE 146*  BUN 27*  CREATININE 1.44*  CALCIUM 9.5    Cardiac Enzymes  Recent Labs Lab 01/22/16 1304  TROPONINI 0.04*    Microbiology Results  Results for orders placed or performed during the hospital encounter of 01/17/16  Surgical pcr screen     Status: None   Collection Time: 01/17/16  9:01 AM  Result Value Ref Range Status   MRSA, PCR NEGATIVE NEGATIVE Final   Staphylococcus aureus NEGATIVE NEGATIVE Final    Comment:        The Xpert SA Assay (FDA approved for NASAL specimens in patients over 16 years of age), is one component of a comprehensive surveillance program.  Test performance has been validated by [facility] for patients greater than or equal to 55 year old. It is not intended to diagnose infection nor to guide or monitor treatment.     RADIOLOGY:  No results found.    Management plans discussed with the patient, family  and they are in agreement.  CODE STATUS:     Code Status Orders        Start     Ordered   01/22/16 1800  Full code  Continuous     01/22/16 1800    Code Status History    Date Active Date Inactive Code Status Order ID Comments User Context   01/20/2016  6:13 PM 01/21/2016  3:41 PM Full Code [id number]  Tressie Stalker, MD Inpatient      TOTAL TIME TAKING CARE OF THIS PATIENT: 40 minutes.    Houston Siren M.D on 01/23/2016 at 11:45 AM  Between 7am to 6pm - Pager - [phone]  After 6pm go to [email or web address] - password [email or web address] [facility]  [facility]  Office  [phone]  CC: Primary care physician; Barbette Reichmann, MD

## 2016-01-23 NOTE — Progress Notes (Signed)
Report called to nurse Garlan Fair at [facility]. IV removed and pt escorted off unit via EMS to facility. Packet given to EMS and copy of DC summary given to wife.

## 2016-01-23 NOTE — Care Management Note (Signed)
Case Management Note  Patient Details  Name: TOBE KAN MRN: [id number] Date of Birth: 10-17-1940  Subjective/Objective:     74yo Mr Worrell received an extensive surgical  disectomy  At [facility] on 01/20/16. He was discharged home on 01/21/16 with his wife and [facility] PT. He reportedly fell at home on 01/21/16 and began having increased pain. On 01/22/16 he was brought to [facility]-ED per back pain and wife stating that she cannot manage him at home and wants Rehab placement. [facility]-SW is pursuing placement options. No case management needs at this point but will follow for discharge planning if Rehab is not an option.               Action/Plan:   Expected Discharge Date:  01/22/16               Expected Discharge Plan:     In-House Referral:     Discharge planning Services     Post Acute Care Choice:    Choice offered to:     DME Arranged:    DME Agency:     HH Arranged:    HH Agency:     Status of Service:     If discussed at [employer], dates discussed:    Additional Comments:  Bergen Magner A, RN 01/23/2016, 9:26 AM

## 2016-01-23 NOTE — Progress Notes (Signed)
LCSW consulted with [place] patients nurse to have PT and OT consults ordered.  [employer] LCSW [phone]

## 2016-02-03 ENCOUNTER — Other Ambulatory Visit: Payer: Self-pay | Admitting: Neurosurgery

## 2016-02-03 ENCOUNTER — Ambulatory Visit
Admission: RE | Admit: 2016-02-03 | Discharge: 2016-02-03 | Disposition: A | Discharge status: Home / Self Care | Payer: Commercial Managed Care - HMO | Source: Ambulatory Visit | Attending: Neurosurgery | Admitting: Neurosurgery

## 2016-02-03 DIAGNOSIS — M7989 Other specified soft tissue disorders: Secondary | ICD-10-CM | POA: Diagnosis not present

## 2016-02-14 ENCOUNTER — Ambulatory Visit: Payer: Commercial Managed Care - HMO | Attending: Family Medicine

## 2016-02-14 VITALS — BP 139/58 | HR 68

## 2016-02-14 DIAGNOSIS — R262 Difficulty in walking, not elsewhere classified: Secondary | ICD-10-CM | POA: Diagnosis present

## 2016-02-14 DIAGNOSIS — M6281 Muscle weakness (generalized): Secondary | ICD-10-CM

## 2016-02-14 DIAGNOSIS — R2681 Unsteadiness on feet: Secondary | ICD-10-CM | POA: Diagnosis present

## 2016-02-14 NOTE — Patient Instructions (Signed)
SIT TO STAND    Place feet shoulder width apart. Lean chest forward. Raise hips and straighten knees to stand. _10__ reps per set, _3__ sets per session, 2 times/day.  Tandem Stance    Right foot in front of left and slightly out to the side. Both feet facing "straight ahead". Balance in this position _30__ seconds. Switch feet and perform with left foot in front of right.   Single Leg - Eyes Open    Holding support, lift right leg while maintaining balance over other leg. Progress to removing hands from support surface for longer periods of time. Hold_30___ seconds. Repeat _3___ times per session. Switch feet and perform on the other side. Do _2___ sessions per day.

## 2016-02-15 NOTE — Therapy (Signed)
[facility] MAIN [facility] SERVICES [address] [place], [place], [address] Phone: [phone]   Fax:  [phone]  Physical Therapy Evaluation  Patient Details  Name: Jordan Chang MRN: [id number] Date of Birth: January 26, 1941 Referring Provider: Dorothey Baseman  Encounter Date: 02/14/2016      PT End of Session - 02/14/16 1035    Visit Number 1   Number of Visits 13   Date for PT Re-Evaluation 04-19-16   Authorization Type g codes every 10th visit   PT Start Time 1020   PT Stop Time 1115   PT Time Calculation (min) 55 min   Equipment Utilized During Treatment Gait belt   Activity Tolerance Patient tolerated treatment well   Behavior During Therapy [facility] for tasks assessed/performed      Past Medical History:  Diagnosis Date  . GERD (gastroesophageal reflux disease)    occ  . Hyperlipemia   . Hypertension     Past Surgical History:  Procedure Laterality Date  . BACK SURGERY  05/2011  . EYE SURGERY     cataracts  . LUMBAR LAMINECTOMY/DECOMPRESSION MICRODISCECTOMY Left 01/20/2016   Procedure: Left Lumbar two-three, Lumbar three-four,  Lumbar four-five Microdiskectomy;  Surgeon: Tressie Stalker, MD;  Location: [facility];  Service: Neurosurgery;  Laterality: Left;    Vitals:   02/14/16 1021  BP: (!) 139/58  Pulse: 68  SpO2: 99%         Subjective Assessment - 02/14/16 1022    Subjective Difficulty walking s/p lumbar fusion.    Patient is accompained by: Family member  Wife   Pertinent History 75 year old male with past medical history of hypertension, hyperlipidemia, GERD, chronic back pain who had intravertebral discectomy with microdissection of the L4-L5 L2-L3 and L3-L4 on 01/20/16 per medical record. Pt was brought to [facility] via EMS shortly after returning home from surgery due to back pain/difficulty walking. He was discharged from [facility] to [facility] where he stayed for a few weeks with significant improvement in his  mobility. Pt was discharged last week from SNF and states that he was sent home with UE exercises but no leg or balance exercises. He is currently on bending, lifting (<15#), and twisting restrictions. ROS negative for red flags.    Patient Stated Goals Improve his balance and strength so that he can get back on his riding lawnmower   Currently in Pain? No/denies            [facility] PT Assessment - 02/14/16 1022      Assessment   Medical Diagnosis Gait difficulty   Referring Provider Dorothey Baseman   Onset Date/Surgical Date 01/20/16   Hand Dominance Right   Next MD Visit This week with Dr. Marcello Fennel, PCP   Prior Therapy Yes at [facility] prior to surgery and then at [facility] for SNF s/p surgery     Precautions   Precautions Fall     Restrictions   Weight Bearing Restrictions No     Balance Screen   Has the patient fallen in the past 6 months Yes   How many times? 2   Has the patient had a decrease in activity level because of a fear of falling?  Yes     Home Environment   Living Environment Private residence   Living Arrangements Spouse/significant other   Available Help at Discharge Family   Type of Home House   Home Access Stairs to enter   Entrance Stairs-Number of Steps 2  Entrance Stairs-Rails Right;Left;Can reach both   Home Layout One level     Prior Function   Level of Independence Independent with basic ADLs   Leisure Wife helps with putting shoes and socks. Independent with bathing     Cognition   Overall Cognitive Status Within Functional Limits for tasks assessed     Observation/Other Assessments   Skin Integrity Incision healing with scab over incision. No redness, swelling, warmth, or discharge noted   Other Surveys  Other Surveys   Modified [place] 14%     Sensation   Additional Comments Pt reports increase in LLE swelling near ankle. Recent doppler negative for DVT. Reports some numbness on medial side of calf      Posture/Postural Control   Posture Comments Flatenned lower back posture     ROM / Strength   AROM / PROM / Strength Strength;AROM     AROM   Overall AROM Comments UE/LE AROM grossly WNL without obvious deficits. Lumbar AROM deferred due to post-surgical restrictions     Strength   Overall Strength Comments Grossly 5/5 throughout UE/LE without symmetrical weakness     Palpation   Palpation comment Deferred     Ambulation/Gait   Gait Comments Pt ambulates with increased lateral trunk sway and decreased rotation. He leans to the R side with each step and demonsrates decreased stance time on RLE     Standardized Balance Assessment   Standardized Balance Assessment Berg Balance Test;Timed Up and Go Test;Five Times Sit to Stand;10 meter walk test;Dynamic Gait Index   Five times sit to stand comments  13.44   10 Meter Walk 13.71= 0.73 m/s     [employer]erg Balance Test   Berg comment: Deferred due to bending, lifting, and twisting restrictions     Dynamic Gait Index   Level Surface Mild Impairment   Change in Gait Speed Mild Impairment   Gait with Horizontal Head Turns Mild Impairment   Gait with Vertical Head Turns Mild Impairment   Gait and Pivot Turn Moderate Impairment   Step Over Obstacle Moderate Impairment   Step Around Obstacles Mild Impairment   Steps Mild Impairment   Total Score 14     Timed Up and Go Test   TUG Normal TUG   Normal TUG (seconds) 19.12       NEUROMUSCULAR RE-EDUCATION  Pt issued written HEP and performed with therapist; Sit to stand without UE support x 10; Semitandem balance progressing to full tandem x 30 seconds/leg; Single leg balance x 30 seconds on each LE Explained and verified understanding of HEP.                     PT Education - 02/14/16 1034    Education provided Yes   Education Details HEP, plan of care   Person(s) Educated Patient   Methods Explanation   Comprehension Verbalized understanding             PT  Long Term Goals - 02/15/16 1717      PT LONG TERM GOAL #1   Title Pt will be independent with HEP to improve balance and decrease risk for falls   Time 6   Period Weeks   Status New     PT LONG TERM GOAL #2   Title Pt will demonstrate 3 point increase in DGI in order to demonstrate improved balance and decreased fall risk   Baseline 02/14/16: 14/24   Time 6   Period Weeks   Status New  PT LONG TERM GOAL #3   Title Pt will improve TUG by at least 5.7 seconds (30%) in order to demonstrate significant reducation in fall risk   Baseline 02/14/16: 19.12   Time 6   Period Weeks     PT LONG TERM GOAL #4   Title Pt will improve 3857m gait speed by at least 0.13 m/s in order to demonstrate clinically significant improvement in mobility   Baseline 02/14/16: 0.73 m/s   Time 6   Period Weeks   Status New               Plan - 02/14/16 1549    Clinical Impression Statement Pt is a pleasant [age] year old male with recent micro discectomy and post-op weakness. He was recently discharged from SNF and referred for physical therapy due to weakness and difficulty walking. PT evaluation reveals decreased balance with DGI of 14/24. TUG is 19.12 and 5TSTS is 13.44 which are both above cut-off for increased risk for falls. Gait speed is also decreased at 0.73 m/s. Pt is still on bending, lifting, and twisting restrictions. Pt will benefit from skilled PT services to address deficits listed above in order to improve function and decrease risk for falls.   Rehab Potential Good   Clinical Impairments Affecting Rehab Potential Positive: motivation; Negative: spinal surgery with post-op weakness   PT Frequency 2x / week   PT Duration 6 weeks   PT Treatment/Interventions ADLs/Self Care Home Management;Aquatic Therapy;Cryotherapy;Electrical Stimulation;Iontophoresis 4mg /ml Dexamethasone;Moist Heat;Ultrasound;Gait training;Stair training;Functional mobility training;Therapeutic activities;Therapeutic  exercise;Balance training;Neuromuscular re-education;Patient/family education;Manual techniques;Passive range of motion   PT Next Visit Plan Progress LE strengthening and balance   PT Home Exercise Plan Modified tandem balance, sit to stand, single leg balance   Consulted and Agree with Plan of Care Patient      Patient will benefit from skilled therapeutic intervention in order to improve the following deficits and impairments:  Abnormal gait, Decreased strength, Difficulty walking, Decreased balance  Visit Diagnosis: Muscle weakness (generalized) - Plan: PT plan of care cert/re-cert  Unsteadiness on feet - Plan: PT plan of care cert/re-cert  Difficulty in walking, not elsewhere classified - Plan: PT plan of care cert/re-cert      G-Codes - 02/15/16 1722    Functional Assessment Tool Used TUG, 5TSTS, 10MWT, DGI, clinical judgement   Functional Limitation Mobility: Walking and moving around   Mobility: Walking and Moving Around Current Status ([id number]) At least 40 percent but less than 60 percent impaired, limited or restricted   Mobility: Walking and Moving Around Goal Status [phone]([id number]) At least 20 percent but less than 40 percent impaired, limited or restricted       Problem List Patient Active Problem List   Diagnosis Date Noted  . Back pain 01/22/2016  . Lumbar herniated disc 01/20/2016    Huprich,Jason 02/15/2016, 5:27 PM  [facility] [address] [place], [place], [address] Phone: [phone]   Fax:  [phone]  Name: Dayton BailiffRobert H Vavrek MRN: [id number] Date of Birth: May 11, 1941

## 2016-02-15 NOTE — Addendum Note (Signed)
Encounter addended by: Huel CoventryMeredith A Kimorah Ridolfi, RN on: 02/15/2016 12:29 PM<BR>    Actions taken: Delete clinical note

## 2016-02-16 ENCOUNTER — Ambulatory Visit: Payer: Commercial Managed Care - HMO

## 2016-02-16 VITALS — BP 139/62 | HR 65

## 2016-02-16 DIAGNOSIS — M6281 Muscle weakness (generalized): Secondary | ICD-10-CM

## 2016-02-16 DIAGNOSIS — R262 Difficulty in walking, not elsewhere classified: Secondary | ICD-10-CM

## 2016-02-16 DIAGNOSIS — R2681 Unsteadiness on feet: Secondary | ICD-10-CM

## 2016-02-16 NOTE — Therapy (Signed)
[facility] [address] [place], [place], [address] Phone: [phone]   Fax:  [phone]  Physical Therapy Treatment  Patient Details  Name: Jordan Chang MRN: [id number] Date of Birth: 1940/07/30 Referring Provider: Dorothey BasemanBronstein, David  Encounter Date: 02/16/2016      PT End of Session - 02/16/16 1030    Visit Number 2   Number of Visits 13   Date for PT Re-Evaluation 03/27/16   Authorization Type g codes every 10th visit   PT Start Time 1020   PT Stop Time 1105   PT Time Calculation (min) 45 min   Equipment Utilized During Treatment Gait belt   Activity Tolerance Patient tolerated treatment well   Behavior During Therapy [facility] for tasks assessed/performed      Past Medical History:  Diagnosis Date  . GERD (gastroesophageal reflux disease)    occ  . Hyperlipemia   . Hypertension     Past Surgical History:  Procedure Laterality Date  . BACK SURGERY  05/2011  . EYE SURGERY     cataracts  . LUMBAR LAMINECTOMY/DECOMPRESSION MICRODISCECTOMY Left 01/20/2016   Procedure: Left Lumbar two-three, Lumbar three-four,  Lumbar four-five Microdiskectomy;  Surgeon: Tressie StalkerJeffrey Jenkins, MD;  Location: [facility];  Service: Neurosurgery;  Laterality: Left;    Vitals:   02/16/16 1025  BP: 139/62  Pulse: 65  SpO2: 100%        Subjective Assessment - 02/16/16 1024    Subjective Pt reports he is doing well on this date. He denies pain currently. Pt is concerned about swelling in LLE. He has told his MD previously and had LLE doppler which was negative for DVT. Pt denies any redness, warmth, chills, fevers, or night sweats. Calf is non-painful to touch. Pt reports that the swelling is worse with dependent position. Pt states he was unable to perform exercises issued at initial evaluation.    Patient is accompained by: Family member  Wife   Pertinent History 75 year old male with past medical history of hypertension,  hyperlipidemia, GERD, chronic back pain who had intravertebral discectomy with microdissection of the L4-L5 L2-L3 and L3-L4 on 01/20/16 per medical record. [facility] after returning home from surgery due to back pain/difficulty walking. [facility] where he stayed for a few weeks with significant improvement in his mobility. Pt was discharged last week from SNF and states that he was sent home with UE exercises but no leg or balance exercises. He is currently on bending, lifting (<15#), and twisting restrictions. ROS negative for red flags.    Patient Stated Goals Improve his balance and strength so that he can get back on his riding lawnmower   Currently in Pain? No/denies        TREATMENT  THER-EX NuStep L3 x 3 minutes, L4 x 2 minutes during history (2 minutes unbilled); Sit to stand without UE support with 4# overhead ball press 2 x 10; BOSU forward lunges alternating LE x 10 each; BOSU side lunges x 10 each direction; Rocker board squats R/L and A/P x 10 each;  NEUROMUSCULAR RE-EDUCATION Airex marches x 1 minute; Airex feet together static balance x 30 seconds; Airex feet together static balance with horizontal and vertical head turns x 1 minute each; Rocker board balance R/L and A/P directions; Rocker board weight shifts R/L and A/P directions;  PT Education - 02/16/16 1028    Education provided Yes   Education Details HEP reinforced, using pillow between knees in R sidelying to elevate LLE to decrease swelling. Pt states he is unable to lay in supine   Person(s) Educated Patient   Methods Explanation   Comprehension Verbalized understanding             PT Long Term Goals - 2016/03/01 1717      PT LONG TERM GOAL #1   Title Pt will be independent with HEP to improve balance and decrease risk for falls   Time 6   Period Weeks   Status New     PT LONG TERM GOAL #2    Title Pt will demonstrate 3 point increase in DGI in order to demonstrate improved balance and decreased fall risk   Baseline 02/14/16: 14/24   Time 6   Period Weeks   Status New     PT LONG TERM GOAL #3   Title Pt will improve TUG by at least 5.7 seconds (30%) in order to demonstrate significant reducation in fall risk   Baseline 02/14/16: 19.12   Time 6   Period Weeks     PT LONG TERM GOAL #4   Title Pt will improve 12m gait speed by at least 0.13 m/s in order to demonstrate clinically significant improvement in mobility   Baseline 02/14/16: 0.73 m/s   Time 6   Period Weeks   Status New               Plan - 02/16/16 1031    Clinical Impression Statement Pt with 2+ to 3+ pitting edema in LLE ankles. No redness, warmth, or pain. Homan sign negative. Pt has appointment tomorrow with Dr. Marcello Fennel and encouraged pt to notify him of LLE swelling. Pt fatigues quickly duirng session requiring multiple seated rest breaks. Vitals remain WNL throughout session. Pt with poor balance in semi-tandem and on uneven surfaces. Pt encouraged to continue HEP. If he is consistent with HEP before next session will consider advancing program.    Rehab Potential Good   Clinical Impairments Affecting Rehab Potential Positive: motivation; Negative: spinal surgery with post-op weakness   PT Frequency 2x / week   PT Duration 6 weeks   PT Treatment/Interventions ADLs/Self Care Home Management;Aquatic Therapy;Cryotherapy;Electrical Stimulation;Iontophoresis 4mg /ml Dexamethasone;Moist Heat;Ultrasound;Gait training;Stair training;Functional mobility training;Therapeutic activities;Therapeutic exercise;Balance training;Neuromuscular re-education;Patient/family education;Manual techniques;Passive range of motion   PT Next Visit Plan Progress LE strengthening and balance, review HEP and consider progression   PT Home Exercise Plan Modified tandem balance, sit to stand, single leg balance   Consulted and Agree with  Plan of Care Patient      Patient will benefit from skilled therapeutic intervention in order to improve the following deficits and impairments:  Abnormal gait, Decreased strength, Difficulty walking, Decreased balance  Visit Diagnosis: Muscle weakness (generalized)  Unsteadiness on feet  Difficulty in walking, not elsewhere classified       G-Codes - March 01, 2016 1722    Functional Assessment Tool Used TUG, 5TSTS, , DGI, clinical judgement   Functional Limitation Mobility: Walking and moving around   Mobility: Walking and Moving Around Current Status [phone]) At least 40 percent but less than 60 percent impaired, limited or restricted   Mobility: Walking and Moving Around Goal Status [phone]) At least 20 percent but less than 40 percent impaired, limited or restricted      Problem List Patient Active Problem List   Diagnosis Date Noted  . Back  pain 01/22/2016  . Lumbar herniated disc 01/20/2016   Lynnea MaizesJason D Tiyonna Sardinha PT, DPT   Yurem Viner 02/16/2016, 1:09 PM  [facility] [address] [place], [place], [address] Phone: [phone]   Fax:  [phone]  Name: Jordan BailiffRobert H Chang MRN: [id number] Date of Birth: 07-07-1940

## 2016-02-24 ENCOUNTER — Ambulatory Visit: Payer: Commercial Managed Care - HMO | Admitting: Physical Therapy

## 2016-02-24 ENCOUNTER — Encounter: Payer: Self-pay | Admitting: Physical Therapy

## 2016-02-24 VITALS — BP 140/66 | HR 80

## 2016-02-24 DIAGNOSIS — M6281 Muscle weakness (generalized): Secondary | ICD-10-CM

## 2016-02-24 DIAGNOSIS — R262 Difficulty in walking, not elsewhere classified: Secondary | ICD-10-CM

## 2016-02-24 DIAGNOSIS — R2681 Unsteadiness on feet: Secondary | ICD-10-CM

## 2016-02-24 NOTE — Therapy (Signed)
[facility] MAIN [facility] SERVICES [address] [place], [place], [address] Phone: [phone]   Fax:  [phone]  Physical Therapy Treatment  Patient Details  Name: Jordan Chang MRN: [id number] Date of Birth: [birth year] Referring Provider: Dorothey Baseman  Encounter Date: 02/24/2016      PT End of Session - 02/24/16 0853    Visit Number 3   Number of Visits 13   Date for PT Re-Evaluation 03/27/16   Authorization Type 3/10 (g codes every 10th visit)   Equipment Utilized During Treatment Gait belt   Activity Tolerance Patient tolerated treatment well   Behavior During Therapy [facility] for tasks assessed/performed      Past Medical History:  Diagnosis Date  . GERD (gastroesophageal reflux disease)    occ  . Hyperlipemia   . Hypertension     Past Surgical History:  Procedure Laterality Date  . BACK SURGERY  05/2011  . EYE SURGERY     cataracts  . LUMBAR LAMINECTOMY/DECOMPRESSION MICRODISCECTOMY Left 01/20/2016   Procedure: Left Lumbar two-three, Lumbar three-four,  Lumbar four-five Microdiskectomy;  Surgeon: Tressie Stalker, MD;  [facility]: [facility];  Service: Neurosurgery;  Laterality: Left;    Vitals:   02/24/16 0809  BP: 140/66  Pulse: 80  SpO2: 95%        Subjective Assessment - 02/24/16 0814    Subjective Pt reports he has been ambulating around his home without his cane without any falls, only one or two stumbing instances.  Does not furniture walk.     Patient is accompained by: Family member  Wife   Pertinent History 75 year old male with past medical history of hypertension, hyperlipidemia, GERD, chronic back pain who had intravertebral discectomy with microdissection of the L4-L5 L2-L3 and L3-L4 on 01/20/16 per medical record. Pt was brought to [facility] via EMS shortly after returning home from surgery due to back pain/difficulty walking. He was discharged from [facility] to [facility] where he stayed for a few weeks  with significant improvement in his mobility. Pt was discharged last week from SNF and states that he was sent home with UE exercises but no leg or balance exercises. He is currently on bending, lifting (<15#), and twisting restrictions. ROS negative for red flags.    Patient Stated Goals Improve his balance and strength so that he can get back on his riding lawnmower       TREATMENT  Therapeutic Exercise: NuStep L3 x 3 minutes (unbilled)  Sit<>stand x10 and again x5 on airex with 1UE supported Step up to 8" step forward/sideways x10 in each direction     NMR:  Airex feet together static balance with horizontal and vertical head turns x 1 minute each  Airex marches x 1 minute Balancing on airex reaching out for cones  Tandem and sideways walking on airex with 1 UE support x3 laps in each direction            PT Education - 02/24/16 0815    Education provided Yes   Education Details Exercise technique   Person(s) Educated Patient   Methods Explanation;Verbal cues   Comprehension Verbalized understanding             PT Long Term Goals - 02/15/16 1717      PT LONG TERM GOAL #1   Title Pt will be independent with HEP to improve balance and decrease risk for falls   Time 6   Period Weeks   Status New  PT LONG TERM GOAL #2   Title Pt will demonstrate 3 point increase in DGI in order to demonstrate improved balance and decreased fall risk   Baseline 02/14/16: 14/24   Time 6   Period Weeks   Status New     PT LONG TERM GOAL #3   Title Pt will improve TUG by at least 5.7 seconds (30%) in order to demonstrate significant reducation in fall risk   Baseline 02/14/16: 19.12   Time 6   Period Weeks     PT LONG TERM GOAL #4   Title Pt will improve 8120m gait speed by at least 0.13 m/s in order to demonstrate clinically significant improvement in mobility   Baseline 02/14/16: 0.73 m/s   Time 6   Period Weeks   Status New               Plan - 02/24/16 0830     Clinical Impression Statement Pt is challenging himself at home, not using cane without any falls.  Required several rest breaks due to generalized fatigue and fatigue in low back.  He will continue to benefit from skilled PT to improve balance and strength and decrease risk of falls.   Rehab Potential Good   Clinical Impairments Affecting Rehab Potential Positive: motivation; Negative: spinal surgery with post-op weakness   PT Frequency 2x / week   PT Duration 6 weeks   PT Treatment/Interventions ADLs/Self Care Home Management;Aquatic Therapy;Cryotherapy;Electrical Stimulation;Iontophoresis 4mg /ml Dexamethasone;Moist Heat;Ultrasound;Gait training;Stair training;Functional mobility training;Therapeutic activities;Therapeutic exercise;Balance training;Neuromuscular re-education;Patient/family education;Manual techniques;Passive range of motion   PT Next Visit Plan Progress LE strengthening and balance, review HEP and consider progression   PT Home Exercise Plan Modified tandem balance, sit to stand, single leg balance   Consulted and Agree with Plan of Care Patient      Patient will benefit from skilled therapeutic intervention in order to improve the following deficits and impairments:  Abnormal gait, Decreased strength, Difficulty walking, Decreased balance  Visit Diagnosis: Muscle weakness (generalized)  Unsteadiness on feet  Difficulty in walking, not elsewhere classified     Problem List Patient Active Problem List   Diagnosis Date Noted  . Back pain 01/22/2016  . Lumbar herniated disc 01/20/2016    Encarnacion ChuAshley Jerianne Anselmo PT, DPT 02/24/2016, 8:54 AM  [facility] [address] [place], [place], [address] Phone: [phone]   Fax:  [phone]  Name: Jordan Chang MRN: [id number] Date of Birth: Apr 08, 1941

## 2016-02-29 ENCOUNTER — Emergency Department: Payer: Commercial Managed Care - HMO

## 2016-02-29 ENCOUNTER — Ambulatory Visit: Payer: Commercial Managed Care - HMO | Attending: Family Medicine

## 2016-02-29 ENCOUNTER — Emergency Department
Admission: EM | Admit: 2016-02-29 | Discharge: 2016-02-29 | Disposition: A | Discharge status: Home / Self Care | Payer: Commercial Managed Care - HMO | Attending: Emergency Medicine | Admitting: Emergency Medicine

## 2016-02-29 VITALS — BP 165/71 | HR 94 | Temp 98.6°F

## 2016-02-29 DIAGNOSIS — R2681 Unsteadiness on feet: Secondary | ICD-10-CM | POA: Diagnosis present

## 2016-02-29 DIAGNOSIS — Z791 Long term (current) use of non-steroidal anti-inflammatories (NSAID): Secondary | ICD-10-CM | POA: Insufficient documentation

## 2016-02-29 DIAGNOSIS — Z79899 Other long term (current) drug therapy: Secondary | ICD-10-CM | POA: Insufficient documentation

## 2016-02-29 DIAGNOSIS — Z7982 Long term (current) use of aspirin: Secondary | ICD-10-CM | POA: Insufficient documentation

## 2016-02-29 DIAGNOSIS — R05 Cough: Secondary | ICD-10-CM | POA: Insufficient documentation

## 2016-02-29 DIAGNOSIS — M6281 Muscle weakness (generalized): Secondary | ICD-10-CM | POA: Diagnosis present

## 2016-02-29 DIAGNOSIS — R600 Localized edema: Secondary | ICD-10-CM | POA: Diagnosis not present

## 2016-02-29 DIAGNOSIS — R262 Difficulty in walking, not elsewhere classified: Secondary | ICD-10-CM | POA: Diagnosis present

## 2016-02-29 DIAGNOSIS — I10 Essential (primary) hypertension: Secondary | ICD-10-CM | POA: Insufficient documentation

## 2016-02-29 DIAGNOSIS — R0602 Shortness of breath: Secondary | ICD-10-CM | POA: Diagnosis present

## 2016-02-29 LAB — COMPREHENSIVE METABOLIC PANEL
ALT: 41 U/L (ref 17–63)
AST: 53 U/L — ABNORMAL HIGH (ref 15–41)
Albumin: 4.4 g/dL (ref 3.5–5.0)
Alkaline Phosphatase: 44 U/L (ref 38–126)
Anion gap: 5 (ref 5–15)
BUN: 24 mg/dL — AB (ref 6–20)
CHLORIDE: 106 mmol/L (ref 101–111)
CO2: 27 mmol/L (ref 22–32)
CREATININE: 1.08 mg/dL (ref 0.61–1.24)
Calcium: 10.2 mg/dL (ref 8.9–10.3)
Glucose, Bld: 157 mg/dL — ABNORMAL HIGH (ref 65–99)
POTASSIUM: 3.9 mmol/L (ref 3.5–5.1)
Sodium: 138 mmol/L (ref 135–145)
TOTAL PROTEIN: 7.6 g/dL (ref 6.5–8.1)
Total Bilirubin: 0.8 mg/dL (ref 0.3–1.2)

## 2016-02-29 LAB — BRAIN NATRIURETIC PEPTIDE: B NATRIURETIC PEPTIDE 5: 101 pg/mL — AB (ref 0.0–100.0)

## 2016-02-29 LAB — CBC
HEMATOCRIT: 44.7 % (ref 40.0–52.0)
Hemoglobin: 15.3 g/dL (ref 13.0–18.0)
MCH: 31.7 pg (ref 26.0–34.0)
MCHC: 34.2 g/dL (ref 32.0–36.0)
MCV: 92.6 fL (ref 80.0–100.0)
PLATELETS: 182 10*3/uL (ref 150–440)
RBC: 4.83 MIL/uL (ref 4.40–5.90)
RDW: 13.8 % (ref 11.5–14.5)
WBC: 7.6 10*3/uL (ref 3.8–10.6)

## 2016-02-29 LAB — TROPONIN I

## 2016-02-29 MED ORDER — IOPAMIDOL (ISOVUE-370) INJECTION 76%
75.0000 mL | Freq: Once | INTRAVENOUS | Status: AC | PRN
  Administered 2016-02-29: 75 mL via INTRAVENOUS
  Filled 2016-02-29: qty 75

## 2016-02-29 NOTE — ED Provider Notes (Signed)
Time Seen: Approximately 1423  I have reviewed the triage notes  Chief Complaint: Shortness of Breath   History of Present Illness: Jordan Chang is a 75 y.o. male who is status post lumbar laminectomy performed back in July 2017. Patient's been undergoing rehabilitation and stated to the personnel that he was feeling short of breath today. He denies any chest pain, fever, productive cough. He states occasionally at night he has a dry nonproductive cough. Patient states he's been on diuretic medications for some bilateral peripheral edema. He's had a negative Doppler study done on his left lower extremity which is more swollen than the right. He denies any nausea or vomiting. Denies any new back or flank discomfort. No issues from his surgical site.   Past Medical History:  Diagnosis Date  . GERD (gastroesophageal reflux disease)    occ  . Hyperlipemia   . Hypertension     Patient Active Problem List   Diagnosis Date Noted  . Back pain 01/22/2016  . Lumbar herniated disc 01/20/2016    Past Surgical History:  Procedure Laterality Date  . BACK SURGERY  05/2011  . EYE SURGERY     cataracts  . LUMBAR LAMINECTOMY/DECOMPRESSION MICRODISCECTOMY Left 01/20/2016   Procedure: Left Lumbar two-three, Lumbar three-four,  Lumbar four-five Microdiskectomy;  Surgeon: Tressie StalkerJeffrey Jenkins, MD;  Location: [facility];  Service: Neurosurgery;  Laterality: Left;    Past Surgical History:  Procedure Laterality Date  . BACK SURGERY  05/2011  . EYE SURGERY     cataracts  . LUMBAR LAMINECTOMY/DECOMPRESSION MICRODISCECTOMY Left 01/20/2016   Procedure: Left Lumbar two-three, Lumbar three-four,  Lumbar four-five Microdiskectomy;  Surgeon: Tressie StalkerJeffrey Jenkins, MD;  Location: [facility];  Service: Neurosurgery;  Laterality: Left;    Current Outpatient Rx  . Order #: [id number] Class: Historical Med  . Order #: [id number] Class: Historical Med  . Order #: [id number] Class: Historical Med  . Order #:  [id number] Class: Historical Med  . Order #: [id number] Class: Normal  . Order #: [id number] Class: Normal  . Order #: [id number] Class: Historical Med  . Order #: [id number] Class: Historical Med  . Order #: [id number] Class: Historical Med  . Order #: [id number] Class: Print  . Order #: [id number] Class: Historical Med  . Order #: [id number] Class: Print  . Order #: [id number] Class: Historical Med  . Order #: [id number] Class: Historical Med    Allergies:  Other  Family History: No family history on file.  Social History: Social History  Substance Use Topics  . Smoking status: Never Smoker  . Smokeless tobacco: Never Used  . Alcohol use Not on file     Review of Systems:   10 point review of systems was performed and was otherwise negative:  Constitutional: No fever Eyes: No visual disturbances ENT: No sore throat, ear pain Cardiac: No chest pain Respiratory: Shortness of breath specially with exertion with no wheezing or stridor Abdomen: No abdominal pain, no vomiting, No diarrhea Endocrine: No weight loss, No night sweats Extremities: Bilateral peripheral edema left greater than right Skin: No rashes, easy bruising Neurologic: No focal weakness, trouble with speech or swollowing Urologic: No dysuria, Hematuria, or urinary frequency   Physical Exam:  ED Triage Vitals  Enc Vitals Group     BP 02/29/16 1112 (!) 151/66     Pulse Rate 02/29/16 1112 88     Resp 02/29/16 1112 (!) 22     Temp 02/29/16 1112 98.1 F (36.7 C)     Temp Source 02/29/16 1112 Oral     SpO2 02/29/16  1112 96 %     Weight 02/29/16 1113 287 lb (130.2 kg)     Height 02/29/16 1113 6' (1.829 m)     Head Circumference --      Peak Flow --      Pain Score --      Pain Loc --      Pain Edu? --      Excl. in GC? --     General: Awake , Alert , and Oriented times 3; GCS 15 Head: Normal cephalic , atraumatic Eyes: Pupils equal , round, reactive to light Nose/Throat: No nasal drainage, patent upper airway  without erythema or exudate.  Neck: Supple, Full range of motion, No anterior adenopathy or palpable thyroid masses Lungs: Clear to ascultation without wheezes , rhonchi, or rales. Diminished breath sounds at the bases Heart: Regular rate, regular rhythm without murmurs , gallops , or rubs Abdomen: Soft, non tender without rebound, guarding , or rigidity; bowel sounds positive and symmetric in all 4 quadrants. No organomegaly .        Extremities: Circumferential edema seen to the level of the knee in both lower extremities left side greater than right. No palpable calf cords or tenderness. Negative Homans sign  Neurologic: normal ambulation, Motor symmetric without deficits, sensory intact Skin: warm, dry, no rashes   Labs:   All laboratory work was reviewed including any pertinent negatives or positives listed below:  Labs Reviewed  COMPREHENSIVE METABOLIC PANEL - Abnormal; Notable for the following:       Result Value   Glucose, Bld 157 (*)    BUN 24 (*)    AST 53 (*)    All other components within normal limits  TROPONIN I  CBC  BRAIN NATRIURETIC PEPTIDE    EKG:  ED ECG REPORT I, Jennye Moccasin, the attending physician, personally viewed and interpreted this ECG.  Date: 02/29/2016 EKG Time: 1119 Rate: 90 Rhythm: normal sinus rhythm QRS Axis: normal Intervals: Right bundle-branch block ST/T Wave abnormalities: normal Conduction Disturbances: none Narrative Interpretation: unremarkable No change from previous EKGs. No associated ischemic findings   Radiology:  "Dg Chest 2 View  Result Date: 02/29/2016 CLINICAL DATA:  Shortness of breath for 1 day EXAM: CHEST  2 VIEW COMPARISON:  12/02/2015 FINDINGS: Cardiomediastinal silhouette is stable. No infiltrate or pulmonary edema. Bilateral perihilar and infrahilar bronchitic changes left greater than right. Mild degenerative changes mid and lower thoracic spine. IMPRESSION: No infiltrate or pulmonary edema. Bilateral perihilar  and infrahilar bronchitic changes left greater than right. Electronically Signed   By: Natasha Mead M.D.   On: 02/29/2016 11:44   Ct Angio Chest Pe W Or Wo Contrast  Result Date: 02/29/2016 CLINICAL DATA:  Shortness of breath. EXAM: CT ANGIOGRAPHY CHEST WITH CONTRAST TECHNIQUE: Multidetector CT imaging of the chest was performed using the standard protocol during bolus administration of intravenous contrast. Multiplanar CT image reconstructions and MIPs were obtained to evaluate the vascular anatomy. CONTRAST:  75 mL of Isovue 370 intravenously. COMPARISON:  None. FINDINGS: No pneumothorax or pleural effusion is noted. No acute pulmonary disease is noted. There is no definite evidence of pulmonary embolus. Atherosclerosis of thoracic aorta is noted without aneurysm or dissection. No mediastinal mass or adenopathy is noted. Mild coronary artery calcifications are noted. Anterior osteophyte formation is seen involving the lower thoracic spine. Within the visualized portion of the upper abdomen, nodular hepatic contours are noted suggesting hepatic cirrhosis. Review of the MIP images confirms the above findings. IMPRESSION: No evidence  of pulmonary embolus. Aortic atherosclerosis. Probable hepatic cirrhosis seen in visualized portion of abdomen. Electronically Signed   By: Lupita Raider, M.D.   On: 02/29/2016 15:39   US Venous Img Lower Unilateral Left  Result Date: 02/03/2016 CLINICAL DATA:  75 year old male with left lower extremity swelling for 2 weeks. Recent back surgery. EXAM: LEFT LOWER EXTREMITY VENOUS DOPPLER ULTRASOUND TECHNIQUE: Gray-scale sonography with graded compression, as well as color Doppler and duplex ultrasound were performed to evaluate the lower extremity deep venous systems from the level of the common femoral vein and including the common femoral, femoral, profunda femoral, popliteal and calf veins including the posterior tibial, peroneal and gastrocnemius veins when visible. The  superficial great saphenous vein was also interrogated. Spectral Doppler was utilized to evaluate flow at rest and with distal augmentation maneuvers in the common femoral, femoral and popliteal veins. COMPARISON:  None. FINDINGS: Normal flow, compressibility, and augmentation within the left distal common femoral, proximal profunda femoral, proximal greater saphenous, entire femoral, popliteal veins, and imaged calf veins. There is no evidence of DVT within the contralateral right common femoral vein. IMPRESSION: No evidence of left lower extremity deep venous thrombosis. Electronically Signed   By: Harmon Pier M.D.   On: 02/03/2016 18:08  "  I personally reviewed the radiologic studies    ED Course:  Patient's stay here was uneventful and he does not appear to have any findings could that would be of concern with his shortness of breath and dry nonproductive cough at this point. Main concern postoperatively. On this visit was for a possible pulmonary embolism. Given the negative chest CT and the description of the patient's shortness of breath I felt was unlikely to be a pulmonary embolism. There is also no evidence for congestive heart failure or community-acquired pneumonia. Patient received a preoperative cardiac assessment prior to his surgery which he cleared and he was advised to contact his primary physician and/or his cardiologist for further outpatient testing as needed but I felt there was no reason why he couldn't continue with his physical therapy. Clinical Course     Assessment: Shortness of breath  Final Clinical Impression:  Final diagnoses:  Shortness of breath     Plan: * Outpatient Patient was advised to return immediately if condition worsens. Patient was advised to follow up with their primary care physician or other specialized physicians involved in their outpatient care. The patient and/or family member/power of attorney had laboratory results reviewed at the  bedside. All questions and concerns were addressed and appropriate discharge instructions were distributed by the nursing staff.             Jennye Moccasin, MD 02/29/16 [id number]

## 2016-02-29 NOTE — Discharge Instructions (Signed)
Please return immediately if condition worsens. Please contact her primary physician or the physician you were given for referral. If you have any specialist physicians involved in her treatment and plan please also contact them. Thank you for using [facility]. Return to the emergency department especially for fever, increasing chest pain, productive cough or wheezing, or any other new concerns

## 2016-02-29 NOTE — Therapy (Signed)
[facility] [address] [place], [place], [address] Phone: [phone]   Fax:  [phone]  Physical Therapy Treatment  Patient Details  Name: Jordan Chang MRN: [id number] Date of Birth: 12-24-1940 Referring Provider: Dorothey BasemanBronstein, David  Encounter Date: 02/29/2016      PT End of Session - 02/29/16 0949    Visit Number 4   Number of Visits 13   Date for PT Re-Evaluation 03/27/16   Authorization Type 4/10 (g codes every 10th visit)   PT Start Time 0950   PT Stop Time 1020   PT Time Calculation (min) 30 min   Equipment Utilized During Treatment Gait belt   Activity Tolerance Patient limited by fatigue;Treatment limited secondary to medical complications (Comment)   Behavior [facility] for tasks assessed/performed      Past Medical History:  Diagnosis Date  . GERD (gastroesophageal reflux disease)    occ  . Hyperlipemia   . Hypertension     Past Surgical History:  Procedure Laterality Date  . BACK SURGERY  05/2011  . EYE SURGERY     cataracts  . LUMBAR LAMINECTOMY/DECOMPRESSION MICRODISCECTOMY Left 01/20/2016   Procedure: Left Lumbar two-three, Lumbar three-four,  Lumbar four-five Microdiskectomy;  Surgeon: Tressie StalkerJeffrey Jenkins, MD;  Location: [facility];  Service: Neurosurgery;  Laterality: Left;    Vitals:   02/29/16 0957  BP: (!) 165/71  Pulse: 94  SpO2: 98%        Subjective Assessment - 02/29/16 0948    Subjective Pt states he is feeling unwell today. He started feeling unwell on Sunday. Pt reports he has been fatigued and short of breath. He was going to cancel his therapy session today but his wife encouraged him to come. Pt reports he has been tapering off of his pain medication that he was taking at [facility] (Tramadol and Oxycodone). He has been takingt he Lasix as prescribed by Dr. Marcello FennelHande at his last visit every other day. Wife brought patient to therapy session today.    Patient is  accompained by: Family member  Wife   Pertinent History 75 year old male with past medical history of hypertension, hyperlipidemia, GERD, chronic back pain who had intravertebral discectomy with microdissection of the L4-L5 L2-L3 and L3-L4 on 01/20/16 per medical record. [facility] shortly after returning home from surgery due to back pain/difficulty walking. [facility] where he stayed for a few weeks with significant improvement in his mobility. Pt was discharged last week from SNF and states that he was sent home with UE exercises but no leg or balance exercises. He is currently on bending, lifting (<15#), and twisting restrictions. ROS negative for red flags.    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 He states that he started feeling poorly this past Sunday as well as yesterday. He describes his symptoms as feeling short of breath and fatigued. He complains of chills and night sweats as well as decreased appetite. Pt denies nausea, vomiting, fever, numbness, tingling, weakness, dysphagia, dysarthria, chest pain, or blood in stool. He reports that his LLE swelling his still slowly improving. He is decreasing his use of pain medication and taking Lasix as prescribed every other day. Pt denies any prior  cardiac history. HR sounds regular to auscultation. Pt taken for ambulation in hallway. SaO2 remains >95% during ambulation however HR increases to 130 bpm with very short distance ambulation. Pt becomes fatigued and has to sit down after 75.' Conversation with patient and wife that he needs to see PCP. They would like to go to [facility] to see PCP before leaving facility. [facility] two separate times and was left on hold for >10 minutes even after explaining to  receptionist that I was calling from a medical facility regarding care for a patient who was currently in the office. Pt assisted by volunteer services in transport chair to [facility] with wife.                         PT Education - 02/29/16 0949    Education provided Yes   Education Details Plan of care, see PCP   Person(s) Educated Patient   Methods Explanation   Comprehension Verbalized understanding             PT Long Term Goals - 02/15/16 1717      PT LONG TERM GOAL #1   Title Pt will be independent with HEP to improve balance and decrease risk for falls   Time 6   Period Weeks   Status New     PT LONG TERM GOAL #2   Title Pt will demonstrate 3 point increase in DGI in order to demonstrate improved balance and decreased fall risk   Baseline 02/14/16: 14/24   Time 6   Period Weeks   Status New     PT LONG TERM GOAL #3   Title Pt will improve TUG by at least 5.7 seconds (30%) in order to demonstrate significant reducation in fall risk   Baseline 02/14/16: 19.12   Time 6   Period Weeks     PT LONG TERM GOAL #4   Title Pt will improve 51m gait speed by at least 0.13 m/s in order to demonstrate clinically significant improvement in mobility   Baseline 02/14/16: 0.73 m/s   Time 6   Period Weeks   Status New               Plan - 02/29/16 0950    Clinical Impression Statement Patient is feeling unwell today upon arrival. Vitals obtained and are WNL with the exception of relatively high resting HR. During ambulation with patinet HR increases steadily to 130 bpm during very short and slow ambulation. Pt stops ambulation after approximatley 70' due to fatigue and reports that he feels too unwell to continue therapy session. Encouraged pt to see PCP, Dr. Marcello Fennel, at [facility]. Attempted to call twice and was placed on hold by receptionist despite advising her that I was calling from a medical patient regarding a patient who was in my care.  Given that it was not an emergency pt was advised to go up to [facility]. Pt transported by volunteer in transport chair to [facility]. Pt encouraged to follow-up as scheduled and continue with HEP.     Rehab Potential Good   Clinical Impairments Affecting Rehab Potential Positive: motivation; Negative: spinal surgery with post-op weakness   PT Frequency 2x / week   PT Duration 6 weeks   PT Treatment/Interventions ADLs/Self Care Home Management;Aquatic Therapy;Cryotherapy;Electrical Stimulation;Iontophoresis 4mg /ml Dexamethasone;Moist Heat;Ultrasound;Gait training;Stair training;Functional mobility training;Therapeutic activities;Therapeutic exercise;Balance training;Neuromuscular re-education;Patient/family education;Manual techniques;Passive range of motion   PT Next Visit Plan Progress  LE strengthening and balance, review HEP and consider progression   PT Home Exercise Plan Modified tandem balance, sit to stand, single leg balance   Consulted and Agree with Plan of Care Patient      Patient will benefit from skilled therapeutic intervention in order to improve the following deficits and impairments:  Abnormal gait, Decreased strength, Difficulty walking, Decreased balance  Visit Diagnosis: Muscle weakness (generalized)  Difficulty in walking, not elsewhere classified  Unsteadiness on feet     Problem List Patient Active Problem List   Diagnosis Date Noted  . Back pain 01/22/2016  . Lumbar herniated disc 01/20/2016   Lynnea Maizes PT, DPT   Huprich,Jason 02/29/2016, 11:09 AM  [facility] MAIN [facility] SERVICES [address] [place], [place], [address] Phone: [phone]   Fax:  [phone]  Name: Jordan Chang MRN: [id number] Date of Birth: [birth year]

## 2016-02-29 NOTE — ED Triage Notes (Signed)
Pt arrives to ER via POV c/o SOB. Pt states SOB X 1-2 days. Denies previous hx of SOB. PT had back surgery at the end of August, unsure if related. Pt SOB worsening with exertion.

## 2016-03-09 ENCOUNTER — Ambulatory Visit: Payer: Commercial Managed Care - HMO | Admitting: Physical Therapy

## 2016-03-10 ENCOUNTER — Ambulatory Visit: Payer: Commercial Managed Care - HMO | Admitting: Physical Therapy

## 2016-03-10 ENCOUNTER — Encounter: Payer: Self-pay | Admitting: Physical Therapy

## 2016-03-10 VITALS — BP 147/63 | HR 80

## 2016-03-10 DIAGNOSIS — R262 Difficulty in walking, not elsewhere classified: Secondary | ICD-10-CM

## 2016-03-10 DIAGNOSIS — M6281 Muscle weakness (generalized): Secondary | ICD-10-CM | POA: Diagnosis not present

## 2016-03-10 DIAGNOSIS — R2681 Unsteadiness on feet: Secondary | ICD-10-CM

## 2016-03-10 NOTE — Therapy (Signed)
[facility] MAIN [facility] SERVICES [address] [place], [place], [address] Phone: [phone]   Fax:  [phone]  Physical Therapy Treatment  Patient Details  Name: Jordan Chang MRN: [id number] Date of Birth: 1941/04/21 Referring Provider: Dorothey Baseman  Encounter Date: 03/10/2016      PT End of Session - 03/10/16 1154    Visit Number 5   Number of Visits 13   Date for PT Re-Evaluation 03/27/16   Authorization Type 5/10 (g codes every 10th visit)   PT Start Time 1105   PT Stop Time 1150   PT Time Calculation (min) 45 min   Equipment Utilized During Treatment Gait belt   Activity Tolerance Patient limited by fatigue;Patient tolerated treatment well   Behavior During Therapy [facility] for tasks assessed/performed      Past Medical History:  Diagnosis Date  . GERD (gastroesophageal reflux disease)    occ  . Hyperlipemia   . Hypertension     Past Surgical History:  Procedure Laterality Date  . BACK SURGERY  05/2011  . EYE SURGERY     cataracts  . LUMBAR LAMINECTOMY/DECOMPRESSION MICRODISCECTOMY Left 01/20/2016   Procedure: Left Lumbar two-three, Lumbar three-four,  Lumbar four-five Microdiskectomy;  Surgeon: Tressie Stalker, MD;  Location: [facility];  Service: Neurosurgery;  Laterality: Left;    Vitals:   03/10/16 1110  BP: (!) 147/63  Pulse: 80  SpO2: 97%        Subjective Assessment - 03/10/16 1107    Subjective Pt was seen at ED after last session due to SOB and elevated HR.  No findings and pt was sent home.  Reports these symptoms subsided within a few days after onset.  Is feeling back his normal state of health today.  Still with LLE swelling which is very slowly improving. Pt says at times he has been walking without his cane and denies any falls since last session.  No other complaints or concerns today.   Patient is accompained by: Family member  Wife   Pertinent History 75 year old male with past medical history  of hypertension, hyperlipidemia, GERD, chronic back pain who had intravertebral discectomy with microdissection of the L4-L5 L2-L3 and L3-L4 on 01/20/16 per medical record. Pt was brought to [facility] via EMS shortly after returning home from surgery due to back pain/difficulty walking. He was discharged from [facility] to [facility] where he stayed for a few weeks with significant improvement in his mobility. Pt was discharged last week from SNF and states that he was sent home with UE exercises but no leg or balance exercises. He is currently on bending, lifting (<15#), and twisting restrictions. ROS negative for red flags.    Patient Stated Goals Improve his balance and strength so that he can get back on his riding lawnmower   Currently in Pain? Yes   Pain Score 4    Pain Location Back   Pain Orientation Left   Pain Descriptors / Indicators Aching;Sore   Pain Type Chronic pain   Pain Onset More than a month ago   Pain Relieving Factors heat, aspirin       TREATMENT  Therapeutic Exercise:  Demonstrated and educated pt in pursed lip breathing and encouraged pt to perform throughout session  Ambulating in hallway with spontaneous verbal cues for forward/back/left/right  Ambulating in hallway with ball toss L and R  Sit<>stand 2x10 without UE support and with feet together  Alternating toe taps from airex up to 8" 2x20  NMR:  Airex feet together static balance with horizontal and vertical head turns x2 minutes  Airex marches x 1 minute  Tandem and sideways walking on airex x4 laps in each direction                  PT Education - 03/10/16 1140    Education provided Yes   Education Details Exercise technique; Pursed lip breathing   Person(s) Educated Patient   Methods Explanation;Demonstration;Verbal cues   Comprehension Returned demonstration;Verbalized understanding             PT Long Term Goals - 02/15/16 1717      PT LONG TERM GOAL #1   Title Pt will be  independent with HEP to improve balance and decrease risk for falls   Time 6   Period Weeks   Status New     PT LONG TERM GOAL #2   Title Pt will demonstrate 3 point increase in DGI in order to demonstrate improved balance and decreased fall risk   Baseline 02/14/16: 14/24   Time 6   Period Weeks   Status New     PT LONG TERM GOAL #3   Title Pt will improve TUG by at least 5.7 seconds (30%) in order to demonstrate significant reducation in fall risk   Baseline 02/14/16: 19.12   Time 6   Period Weeks     PT LONG TERM GOAL #4   Title Pt will improve 4161m gait speed by at least 0.13 m/s in order to demonstrate clinically significant improvement in mobility   Baseline 02/14/16: 0.73 m/s   Time 6   Period Weeks   Status New               Plan - 03/10/16 1155    Clinical Impression Statement Pt requires several rest breaks due to SOB.  HR up to 113 and SpO2 remains at or above 94% with exercise. Pt educated on pursed lip breathing with improvement with SOB.  Pt will continue to benefit from skilled PT services to improve functional endurance, balance, and strength.   Rehab Potential Good   Clinical Impairments Affecting Rehab Potential Positive: motivation; Negative: spinal surgery with post-op weakness   PT Frequency 2x / week   PT Duration 6 weeks   PT Treatment/Interventions ADLs/Self Care Home Management;Aquatic Therapy;Cryotherapy;Electrical Stimulation;Iontophoresis 4mg /ml Dexamethasone;Moist Heat;Ultrasound;Gait training;Stair training;Functional mobility training;Therapeutic activities;Therapeutic exercise;Balance training;Neuromuscular re-education;Patient/family education;Manual techniques;Passive range of motion   PT Next Visit Plan Progress LE strengthening and balance, review HEP and consider progression   PT Home Exercise Plan Modified tandem balance, sit to stand, single leg balance   Consulted and Agree with Plan of Care Patient      Patient will benefit from  skilled therapeutic intervention in order to improve the following deficits and impairments:  Abnormal gait, Decreased strength, Difficulty walking, Decreased balance  Visit Diagnosis: Muscle weakness (generalized)  Difficulty in walking, not elsewhere classified  Unsteadiness on feet     Problem List Patient Active Problem List   Diagnosis Date Noted  . Back pain 01/22/2016  . Lumbar herniated disc 01/20/2016    Encarnacion ChuAshley Avree Szczygiel PT, DPT 03/10/2016, 11:59 AM  [facility] MAIN [facility] [address] [place], [place], [address] Phone: [phone]   Fax:  [phone]  Name: [place] BailiffRobert H Henken MRN: [id number] Date of Birth: Nov 01, 1940

## 2016-03-14 ENCOUNTER — Encounter: Payer: Self-pay | Admitting: Physical Therapy

## 2016-03-14 ENCOUNTER — Ambulatory Visit: Payer: Commercial Managed Care - HMO | Admitting: Physical Therapy

## 2016-03-14 VITALS — BP 127/59 | HR 79

## 2016-03-14 DIAGNOSIS — R262 Difficulty in walking, not elsewhere classified: Secondary | ICD-10-CM

## 2016-03-14 DIAGNOSIS — R2681 Unsteadiness on feet: Secondary | ICD-10-CM

## 2016-03-14 DIAGNOSIS — M6281 Muscle weakness (generalized): Secondary | ICD-10-CM

## 2016-03-14 NOTE — Therapy (Signed)
[facility] MAIN [facility] SERVICES [address] [place], [place], [address] Phone: [phone]   Fax:  [phone]  Physical Therapy Treatment  Patient Details  Name: Jordan Chang MRN: [id number] Date of Birth: [birth year] Referring Provider: Dorothey Baseman  Encounter Date: 03/14/2016      PT End of Session - 03/14/16 1016    Visit Number 6   Number of Visits 13   Date for PT Re-Evaluation 03/27/16   Authorization Type 6/10 (g codes every 10th visit)   PT Start Time 0930   PT Stop Time 1015   PT Time Calculation (min) 45 min   Equipment Utilized During Treatment Gait belt   Activity Tolerance Patient limited by fatigue;Patient tolerated treatment well   Behavior During Therapy [facility] for tasks assessed/performed      Past Medical History:  Diagnosis Date  . GERD (gastroesophageal reflux disease)    occ  . Hyperlipemia   . Hypertension     Past Surgical History:  Procedure Laterality Date  . BACK SURGERY  05/2011  . EYE SURGERY     cataracts  . LUMBAR LAMINECTOMY/DECOMPRESSION MICRODISCECTOMY Left 01/20/2016   Procedure: Left Lumbar two-three, Lumbar three-four,  Lumbar four-five Microdiskectomy;  Surgeon: Tressie Stalker, MD;  Location: [facility];  Service: Neurosurgery;  Laterality: Left;    Vitals:   03/14/16 0932  BP: (!) 127/59  Pulse: 79  SpO2: 97%        Subjective Assessment - 03/14/16 0930    Subjective Pt reports he has been practicing his pursed lip breathing exercise.  Pt has been elevating his feet when sitting as instructed and will continue to do so.  Denies any falls since last session.  No complaints or concerns today.     Patient is accompained by: Family member  Wife   Pertinent History 75 year old male with past medical history of hypertension, hyperlipidemia, GERD, chronic back pain who had intravertebral discectomy with microdissection of the L4-L5 L2-L3 and L3-L4 on 01/20/16 per medical record. Pt  was brought to [facility] via EMS shortly after returning home from surgery due to back pain/difficulty walking. He was discharged from [facility] to [facility] where he stayed for a few weeks with significant improvement in his mobility. Pt was discharged last week from SNF and states that he was sent home with UE exercises but no leg or balance exercises. He is currently on bending, lifting (<15#), and twisting restrictions. ROS negative for red flags.    Patient Stated Goals Improve his balance and strength so that he can get back on his riding lawnmower   Pain Onset More than a month ago       TREATMENT  Therapeutic Exercise:  Instructed pt to perform pursed lip breathing throughout session  Ambulating in hallway with spontaneous verbal cues for forward/back/left/right  Ambulating in hallway with ball toss L and R  Ambulating in hallway, pt tossing ball up and bouncing on the floor  Sit<>stand 2x10 without UE support and with feet together  Leg press 120# x10, 135# 1x10 and 1x15 with max verbal cues for slower controlled eccentric lowering   Neuromuscular Rehab:  Airex feet together static balance with horizontal and vertical head turns x2 minutes  Tandem and sideways walking on airex x2 laps in each direction  Pt requires seated and standing rest breaks after each exercise  PT Education - 03/14/16 0951    Education provided Yes   Education Details Exercise technique; Pursed lip breathing in standing and sitting   Person(s) Educated Patient   Methods Explanation;Demonstration;Verbal cues   Comprehension Verbalized understanding;Returned demonstration             PT Long Term Goals - 02/15/16 1717      PT LONG TERM GOAL #1   Title Pt will be independent with HEP to improve balance and decrease risk for falls   Time 6   Period Weeks   Status New     PT LONG TERM GOAL #2   Title Pt will demonstrate 3 point increase in DGI in order to  demonstrate improved balance and decreased fall risk   Baseline 02/14/16: 14/24   Time 6   Period Weeks   Status New     PT LONG TERM GOAL #3   Title Pt will improve TUG by at least 5.7 seconds (30%) in order to demonstrate significant reducation in fall risk   Baseline 02/14/16: 19.12   Time 6   Period Weeks     PT LONG TERM GOAL #4   Title Pt will improve 6232m gait speed by at least 0.13 m/s in order to demonstrate clinically significant improvement in mobility   Baseline 02/14/16: 0.73 m/s   Time 6   Period Weeks   Status New               Plan - 03/14/16 [id number]    Clinical Impression Statement Pt requires cues for posture and forward gaze with ambulating exercises and sit<>stand.  He was encouraged to trial taking a standing rest break rather than a seated rest break each time; however pt resorts to a seated rest break due to LE fatigue and SOB.  HR up to 112 and SpO2 remains at or above 96%.  He will benefit from skilled PT interventions to improve strength and balance.   Rehab Potential Good   Clinical Impairments Affecting Rehab Potential Positive: motivation; Negative: spinal surgery with post-op weakness   PT Frequency 2x / week   PT Duration 6 weeks   PT Treatment/Interventions ADLs/Self Care Home Management;Aquatic Therapy;Cryotherapy;Electrical Stimulation;Iontophoresis 4mg /ml Dexamethasone;Moist Heat;Ultrasound;Gait training;Stair training;Functional mobility training;Therapeutic activities;Therapeutic exercise;Balance training;Neuromuscular re-education;Patient/family education;Manual techniques;Passive range of motion   PT Next Visit Plan Progress LE strengthening and balance   PT Home Exercise Plan Modified tandem balance, sit to stand, single leg balance   Consulted and Agree with Plan of Care Patient      Patient will benefit from skilled therapeutic intervention in order to improve the following deficits and impairments:  Abnormal gait, Decreased strength,  Difficulty walking, Decreased balance  Visit Diagnosis: Muscle weakness (generalized)  Difficulty in walking, not elsewhere classified  Unsteadiness on feet     Problem List Patient Active Problem List   Diagnosis Date Noted  . Back pain 01/22/2016  . Lumbar herniated disc 01/20/2016    Encarnacion ChuAshley Abashian PT, DPT 03/14/2016, 10:17 AM  [facility] [address] [place], [place], [address] Phone: [phone]   Fax:  [phone]  Name: Jordan Chang MRN: [id number] Date of Birth: 1940-07-03

## 2016-03-16 ENCOUNTER — Encounter: Payer: Self-pay | Admitting: Physical Therapy

## 2016-03-16 ENCOUNTER — Ambulatory Visit: Payer: Commercial Managed Care - HMO | Admitting: Physical Therapy

## 2016-03-16 VITALS — BP 135/71 | HR 87

## 2016-03-16 DIAGNOSIS — M6281 Muscle weakness (generalized): Secondary | ICD-10-CM | POA: Diagnosis not present

## 2016-03-16 DIAGNOSIS — R2681 Unsteadiness on feet: Secondary | ICD-10-CM

## 2016-03-16 DIAGNOSIS — R262 Difficulty in walking, not elsewhere classified: Secondary | ICD-10-CM

## 2016-03-16 NOTE — Therapy (Signed)
[facility] MAIN [facility] [address] [place], [place], [address] Phone: [phone]   Fax:  [phone]  Physical Therapy Treatment  Patient Details  Name: Jordan BailiffRobert H Chang MRN: [id number] Date of Birth: 06/13/1941 Referring Provider: Dorothey BasemanBronstein, David  Encounter Date: 03/16/2016      PT End of Session - 03/16/16 1005    Visit Number 7   Number of Visits 13   Date for PT Re-Evaluation 03/27/16   Authorization Type 7/10 (g codes every 10th visit)   PT Start Time 0920   PT Stop Time 1005   PT Time Calculation (min) 45 min   Equipment Utilized During Treatment Gait belt   Activity Tolerance Patient limited by fatigue;Patient tolerated treatment well   Behavior During Therapy [facility] for tasks assessed/performed      Past Medical History:  Diagnosis Date  . GERD (gastroesophageal reflux disease)    occ  . Hyperlipemia   . Hypertension     Past Surgical History:  Procedure Laterality Date  . BACK SURGERY  05/2011  . EYE SURGERY     cataracts  . LUMBAR LAMINECTOMY/DECOMPRESSION MICRODISCECTOMY Left 01/20/2016   Procedure: Left Lumbar two-three, Lumbar three-four,  Lumbar four-five Microdiskectomy;  Surgeon: Tressie StalkerJeffrey Jenkins, MD;  Location: [facility];  Service: Neurosurgery;  Laterality: Left;    Vitals:   03/16/16 0922  BP: 135/71  Pulse: 87  SpO2: 97%        Subjective Assessment - 03/16/16 0921    Subjective Pt reports he's feeling better today versus previous visits. Reports he's been practicing purse-liup breathing and [facility]    Patient is accompained by: Family member  Wife   Pertinent History 75 year old male with past medical history of hypertension, hyperlipidemia, GERD, chronic back pain who had intravertebral discectomy with microdissection of the L4-L5 L2-L3 and L3-L4 on 01/20/16 per medical record. [facility] shortly after returning home from surgery due to back  pain/difficulty walking. [facility] where he stayed for a few weeks with significant improvement in his mobility. Pt was discharged last week from SNF and states that he was sent home with UE exercises but no leg or balance exercises. He is currently on bending, lifting (<15#), and twisting restrictions. ROS negative for red flags.    Patient Stated Goals Improve his balance and strength so that he can get back on his riding lawnmower   Pain Onset More than a month ago     Treatment:  Therapeutic Exercise:  Instructed pt to perform pursed lip breathing and gave verbal cueing for maintaining an erect posture throughout session.    Sit<>stand 2x12 without UE support and with feet together  Leg press 120# x20, 150# 1x20 and 165# 1x20 with max verbal cues for slower controlled eccentric lowering    Neuromuscular Rehab:  Airex feet together static balance -- horizontal and vertical head turns -- x2 minutes  Tandem and sideways walking on airex -- x3 laps in each direction  Toe taps onto Bosu while standing on the airex - x15 with UE support in  bars  Single leg stance in  -- 20 sec x 2   Pt requires seated and standing rest breaks after each exercise. SpO2: 94-98% throughout the session. HR: 90- 120bpm        PT Education - 03/16/16 0943    Education provided Yes   Education Details Reviewed  proper form with exercises and pursed lip breathing   Person(s) Educated Patient   Methods Explanation;Demonstration   Comprehension Verbalized understanding;Returned demonstration             PT Long Term Goals - 02/15/16 1717      PT LONG TERM GOAL #1   Title Pt will be independent with HEP to improve balance and decrease risk for falls   Time 6   Period Weeks   Status New     PT LONG TERM GOAL #2   Title Pt will demonstrate 3 point increase in DGI in order to demonstrate improved balance and decreased fall risk   Baseline 02/14/16: 14/24    Time 6   Period Weeks   Status New     PT LONG TERM GOAL #3   Title Pt will improve TUG by at least 5.7 seconds (30%) in order to demonstrate significant reducation in fall risk   Baseline 02/14/16: 19.12   Time 6   Period Weeks     PT LONG TERM GOAL #4   Title Pt will improve 9m gait speed by at least 0.13 m/s in order to demonstrate clinically significant improvement in mobility   Baseline 02/14/16: 0.73 m/s   Time 6   Period Weeks   Status New               Plan - 03/16/16 1008    Clinical Impression Statement Patient demonstrates postural sway and patient requires UE support when performing standing exercises indicating decreased dynamic and static balance. Patient requires constant verbal cueing for correct postural alignment and purposeful breathing throuhgout session and will benefit from further skilled therapy to decrease fall risk. Patient required sitting rest breaks throughout sessions secondary to increased fatigue. SpO2 ranged between 94% and 98% with HR ranging from 90-120 bpm indicating normative values   Rehab Potential Good   Clinical Impairments Affecting Rehab Potential Positive: motivation; Negative: spinal surgery with post-op weakness   PT Frequency 2x / week   PT Duration 6 weeks   PT Treatment/Interventions ADLs/Self Care Home Management;Aquatic Therapy;Cryotherapy;Electrical Stimulation;Iontophoresis 4mg /ml Dexamethasone;Moist Heat;Ultrasound;Gait training;Stair training;Functional mobility training;Therapeutic activities;Therapeutic exercise;Balance training;Neuromuscular re-education;Patient/family education;Manual techniques;Passive range of motion   PT Next Visit Plan Progress LE strengthening and balance   PT Home Exercise Plan Modified tandem balance, sit to stand, single leg balance   Consulted and Agree with Plan of Care Patient      Patient will benefit from skilled therapeutic intervention in order to improve the following deficits and  impairments:  Abnormal gait, Decreased strength, Difficulty walking, Decreased balance  Visit Diagnosis: Muscle weakness (generalized)  Difficulty in walking, not elsewhere classified  Unsteadiness on feet     Problem List Patient Active Problem List   Diagnosis Date Noted  . Back pain 01/22/2016  . Lumbar herniated disc 01/20/2016    Myrene Galas, PT, DPT 03/16/2016, 12:16 PM  [facility] MAIN [facility] SERVICES [address] [place], [place], [address] Phone: [phone]   Fax:  [phone]  Name: Jordan Chang MRN: [id number] Date of Birth: 12/28/1940

## 2016-03-21 ENCOUNTER — Ambulatory Visit: Payer: Commercial Managed Care - HMO

## 2016-03-23 ENCOUNTER — Ambulatory Visit: Payer: Commercial Managed Care - HMO

## 2016-04-05 ENCOUNTER — Encounter: Payer: Commercial Managed Care - HMO | Admitting: Physical Therapy

## 2016-08-15 DIAGNOSIS — Z0000 Encounter for general adult medical examination without abnormal findings: Secondary | ICD-10-CM | POA: Diagnosis not present

## 2016-08-31 DIAGNOSIS — I10 Essential (primary) hypertension: Secondary | ICD-10-CM | POA: Diagnosis not present

## 2016-08-31 DIAGNOSIS — Z0000 Encounter for general adult medical examination without abnormal findings: Secondary | ICD-10-CM | POA: Diagnosis not present

## 2016-08-31 DIAGNOSIS — H918X3 Other specified hearing loss, bilateral: Secondary | ICD-10-CM | POA: Diagnosis not present

## 2016-08-31 DIAGNOSIS — Z6841 Body Mass Index (BMI) 40.0 and over, adult: Secondary | ICD-10-CM | POA: Diagnosis not present

## 2016-08-31 DIAGNOSIS — M5126 Other intervertebral disc displacement, lumbar region: Secondary | ICD-10-CM | POA: Diagnosis not present

## 2016-12-28 DIAGNOSIS — Z6841 Body Mass Index (BMI) 40.0 and over, adult: Secondary | ICD-10-CM | POA: Diagnosis not present

## 2016-12-28 DIAGNOSIS — H918X3 Other specified hearing loss, bilateral: Secondary | ICD-10-CM | POA: Diagnosis not present

## 2016-12-28 DIAGNOSIS — I10 Essential (primary) hypertension: Secondary | ICD-10-CM | POA: Diagnosis not present

## 2016-12-28 DIAGNOSIS — Z0000 Encounter for general adult medical examination without abnormal findings: Secondary | ICD-10-CM | POA: Diagnosis not present

## 2016-12-28 DIAGNOSIS — M5126 Other intervertebral disc displacement, lumbar region: Secondary | ICD-10-CM | POA: Diagnosis not present

## 2017-01-03 DIAGNOSIS — M5442 Lumbago with sciatica, left side: Secondary | ICD-10-CM | POA: Diagnosis not present

## 2017-01-03 DIAGNOSIS — Z6841 Body Mass Index (BMI) 40.0 and over, adult: Secondary | ICD-10-CM | POA: Diagnosis not present

## 2017-01-03 DIAGNOSIS — R739 Hyperglycemia, unspecified: Secondary | ICD-10-CM | POA: Diagnosis not present

## 2017-01-03 DIAGNOSIS — Z23 Encounter for immunization: Secondary | ICD-10-CM | POA: Diagnosis not present

## 2017-01-03 DIAGNOSIS — G8929 Other chronic pain: Secondary | ICD-10-CM | POA: Diagnosis not present

## 2017-01-03 DIAGNOSIS — I10 Essential (primary) hypertension: Secondary | ICD-10-CM | POA: Diagnosis not present

## 2017-01-03 DIAGNOSIS — H918X3 Other specified hearing loss, bilateral: Secondary | ICD-10-CM | POA: Diagnosis not present

## 2017-02-27 DIAGNOSIS — I10 Essential (primary) hypertension: Secondary | ICD-10-CM | POA: Diagnosis not present

## 2017-02-27 DIAGNOSIS — Z6839 Body mass index (BMI) 39.0-39.9, adult: Secondary | ICD-10-CM | POA: Diagnosis not present

## 2017-02-27 DIAGNOSIS — M5126 Other intervertebral disc displacement, lumbar region: Secondary | ICD-10-CM | POA: Diagnosis not present

## 2017-03-30 IMAGING — MR MR LUMBAR SPINE WO/W CM
4 of 7 series · 28 of 48 positions shown · IV contrast (20ml Multihance)
Comparison: Lumbar spine radiographs 07/07/2015

CLINICAL DATA: Severe low back and left leg pain for approximately
8 months. History of back surgery in 4334.

Creatinine was obtained on site at [HOSPITAL] at [HOSPITAL].
Results: Creatinine 1.4 mg/dL.
EXAM:
MRI LUMBAR SPINE WITHOUT AND WITH CONTRAST
TECHNIQUE: Multiplanar and multiecho pulse sequences of the lumbar spine were
obtained without and with intravenous contrast.
CONTRAST:  20mL MULTIHANCE GADOBENATE DIMEGLUMINE 529 MG/ML IV SOLN

[Series 4: T2 · sagittal · 4.0mm · 0.55mm/px · 3 of 13 slices shown (1 of 2)]
[im 1/13]
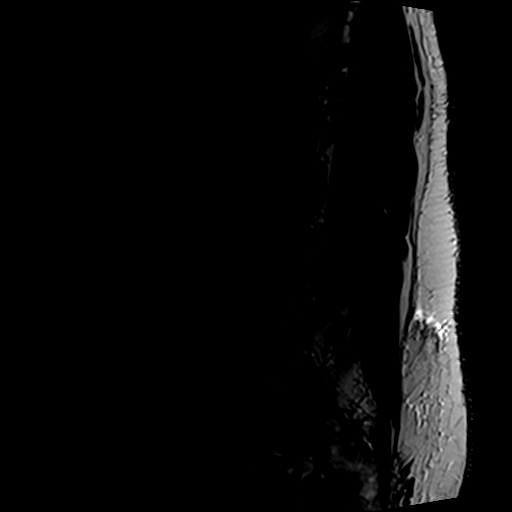
[im 7/13]
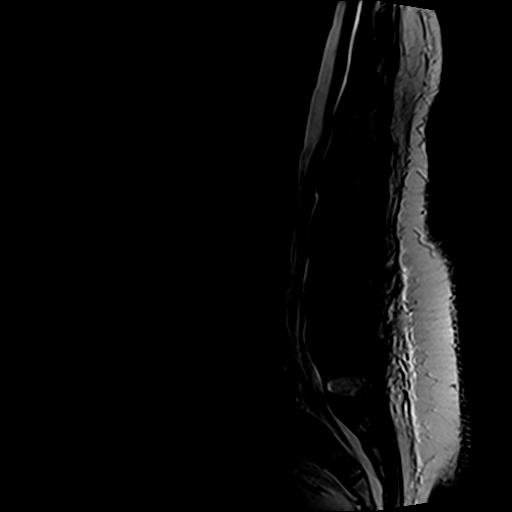
[im 13/13]
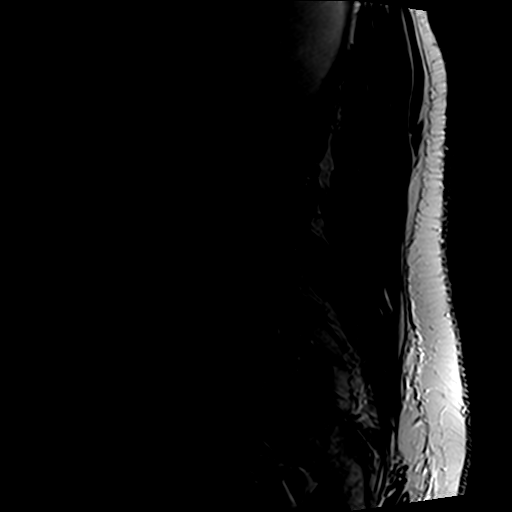

[Series 6: T2 · axial · 4.0mm · 0.70mm/px · z∈[-129,+72]mm · 11 of 37 slices shown (2 of 2)]
[im 1/37]
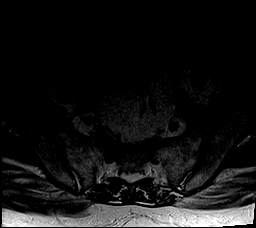
[im 4/37]
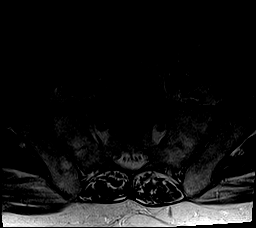
[im 8/37]
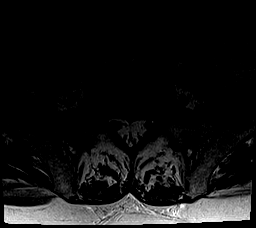
[im 11/37]
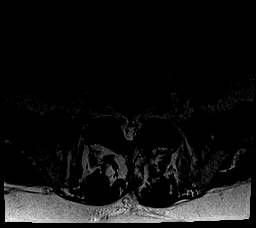
[im 15/37]
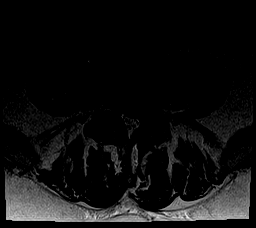
[im 19/37]
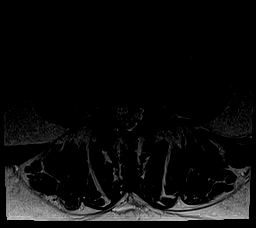
[im 22/37]
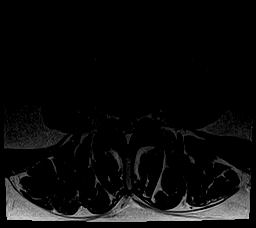
[im 26/37]
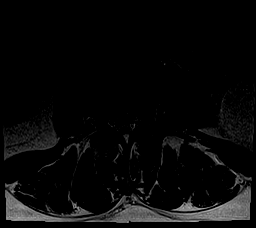
[im 29/37]
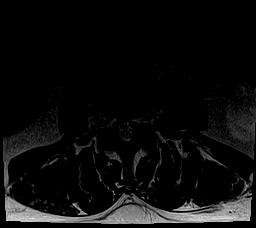
[im 33/37]
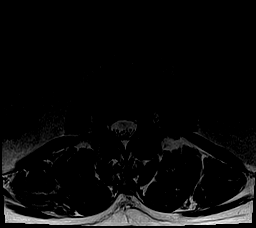
[im 37/37]
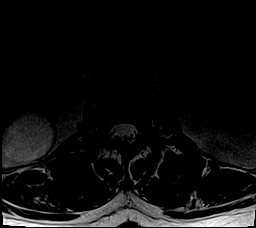

[Series 7: T1 · axial · 4.0mm · 0.35mm/px · z∈[-129,+72]mm · 11 of 37 slices shown (1 of 2)]
[im 1/37]
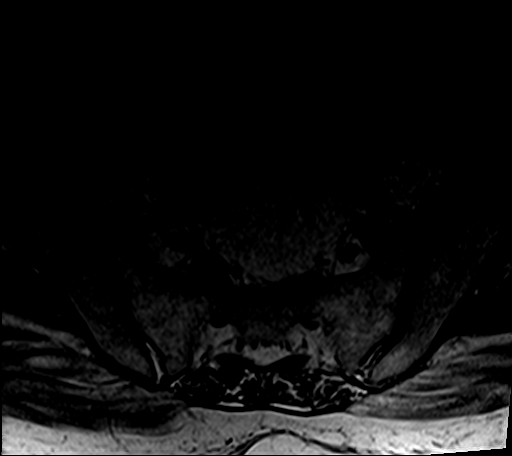
[im 4/37]
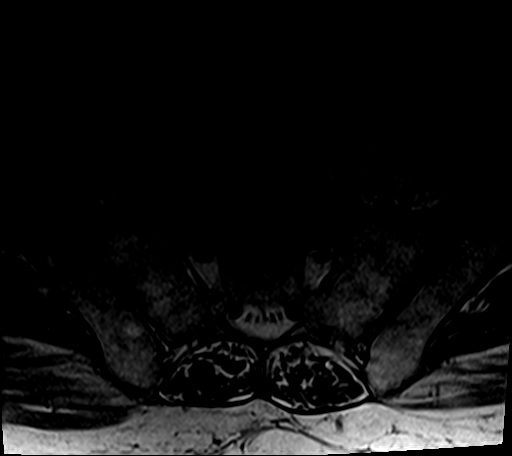
[im 8/37]
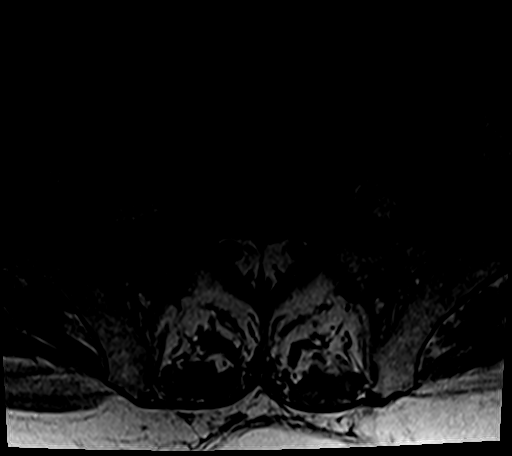
[im 11/37]
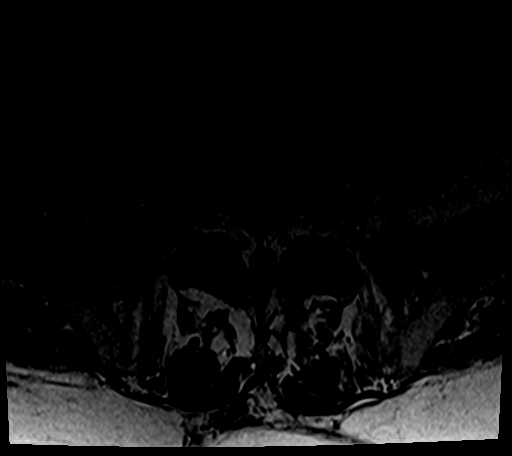
[im 15/37]
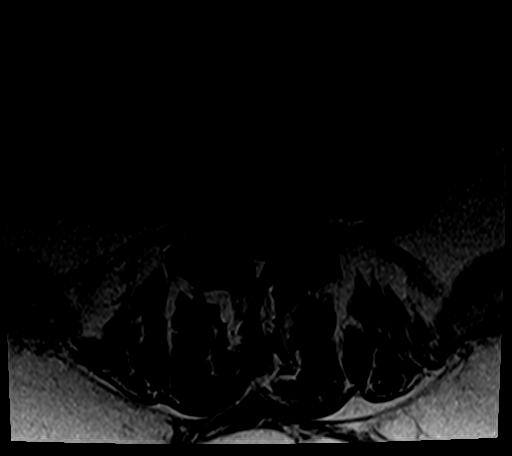
[im 19/37]
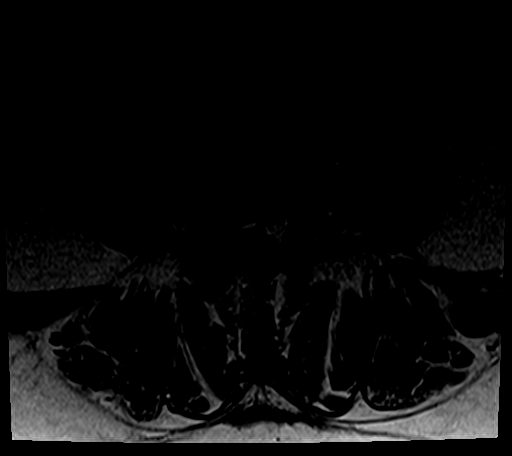
[im 22/37]
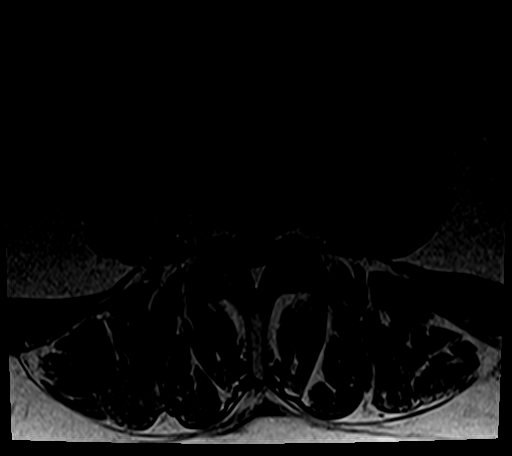
[im 26/37]
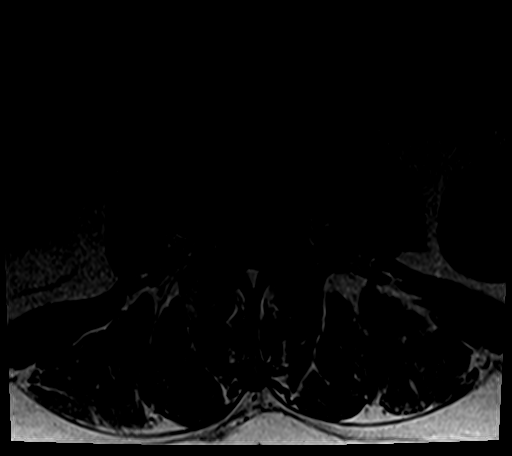
[im 29/37]
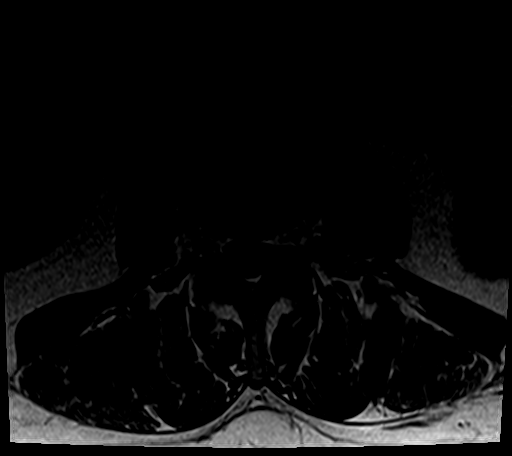
[im 33/37]
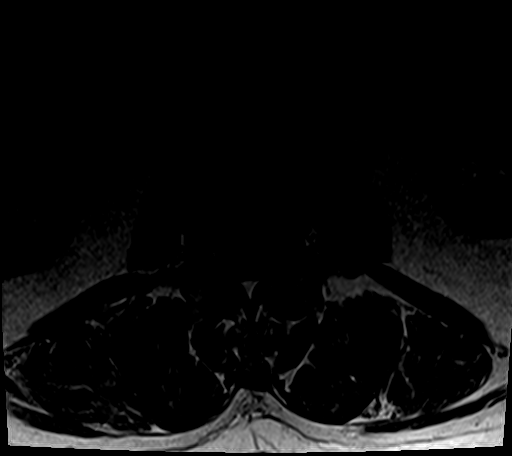
[im 37/37]
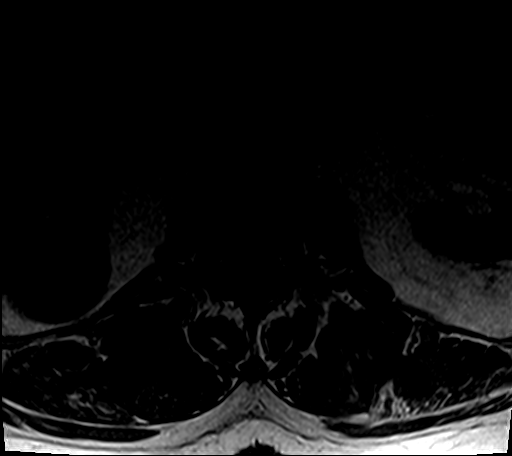

[Series 8: T1 · sagittal · 4.0mm · 0.55mm/px · 3 of 13 slices shown (2 of 2)]
[im 1/13]
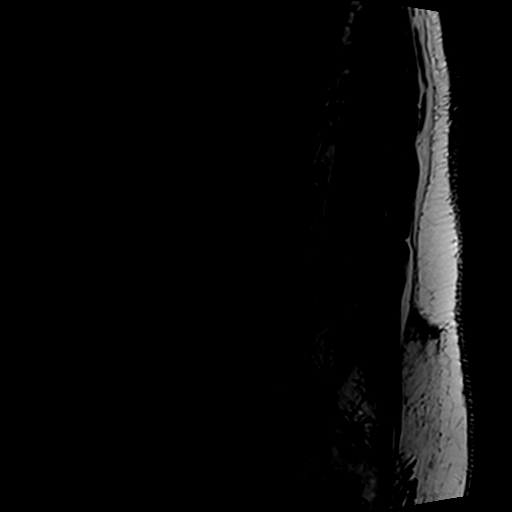
[im 9/13]
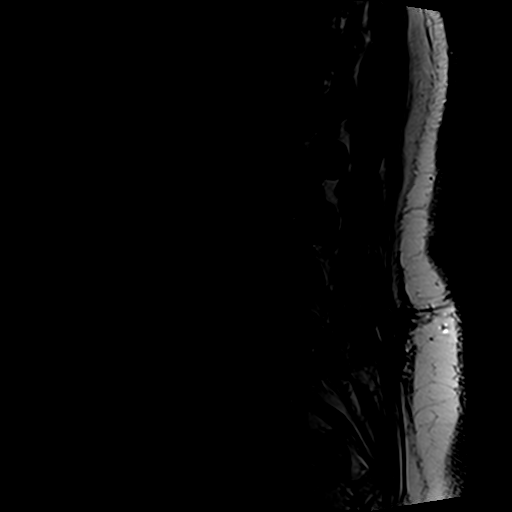
[im 13/13]
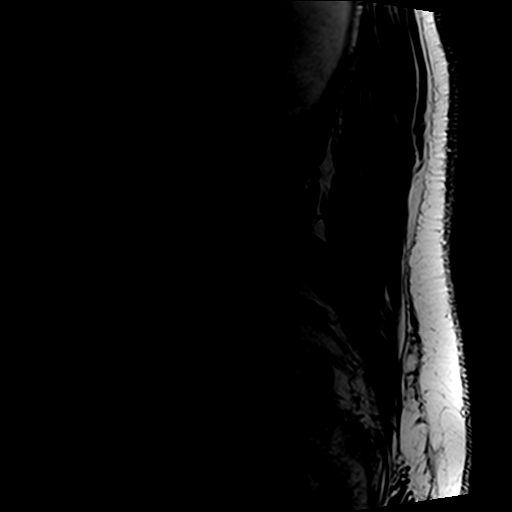

[28 of 48 positions shown; findings below may reference images not displayed]

FINDINGS: Segmentation: 5 lumbar type vertebral bodies correlating with the
radiographs. The last full intervertebral disc space is labeled
L5-S1.

Alignment: Mild degenerative lumbar spondylosis but normal overall
alignment.

Vertebrae:  No significant findings.

Conus medullaris: Extends to the L1 level and appears normal.

Paraspinal and other soft tissues: Bilateral renal cysts. No
worrisome findings.

Disc levels:

L1-2:  No significant findings.

L2-3: Focal central and left paracentral disc protrusion with
moderate mass effect on the thecal sac likely effecting the left L3
nerve root in the lateral recess. No foraminal stenosis. Mild facet
disease.

L3-4: Moderate-sized left paracentral disc protrusion with mass
effect on the left side of thecal sac and on the left L4 nerve root.
No foraminal stenosis. Mild to moderate facet disease.

L4-5: Moderate-sized disc osteophyte complex on the left side with
mass effect on the thecal sac and on the left L5 nerve root in the
lateral recess. No foraminal stenosis. Moderate facet disease.

L5-S1: Bulging degenerated annulus but no significant spinal or
foraminal stenosis.
IMPRESSION: 1. Significant left-sided disc protrusions at L2-3, L3-4 and L4-5 as
discussed above.
2. Degenerate disc disease at L5-S1 but no spinal or foraminal
stenosis.

## 2017-04-18 DIAGNOSIS — I10 Essential (primary) hypertension: Secondary | ICD-10-CM | POA: Diagnosis not present

## 2017-04-18 DIAGNOSIS — H918X3 Other specified hearing loss, bilateral: Secondary | ICD-10-CM | POA: Diagnosis not present

## 2017-04-18 DIAGNOSIS — Z125 Encounter for screening for malignant neoplasm of prostate: Secondary | ICD-10-CM | POA: Diagnosis not present

## 2017-04-18 DIAGNOSIS — M5442 Lumbago with sciatica, left side: Secondary | ICD-10-CM | POA: Diagnosis not present

## 2017-04-18 DIAGNOSIS — G8929 Other chronic pain: Secondary | ICD-10-CM | POA: Diagnosis not present

## 2017-04-18 DIAGNOSIS — R739 Hyperglycemia, unspecified: Secondary | ICD-10-CM | POA: Diagnosis not present

## 2017-04-25 DIAGNOSIS — M5442 Lumbago with sciatica, left side: Secondary | ICD-10-CM | POA: Diagnosis not present

## 2017-04-25 DIAGNOSIS — Z0001 Encounter for general adult medical examination with abnormal findings: Secondary | ICD-10-CM | POA: Diagnosis not present

## 2017-04-25 DIAGNOSIS — Z6839 Body mass index (BMI) 39.0-39.9, adult: Secondary | ICD-10-CM | POA: Diagnosis not present

## 2017-04-25 DIAGNOSIS — G8929 Other chronic pain: Secondary | ICD-10-CM | POA: Diagnosis not present

## 2017-04-25 DIAGNOSIS — M5126 Other intervertebral disc displacement, lumbar region: Secondary | ICD-10-CM | POA: Diagnosis not present

## 2017-04-25 DIAGNOSIS — H918X3 Other specified hearing loss, bilateral: Secondary | ICD-10-CM | POA: Diagnosis not present

## 2017-04-25 DIAGNOSIS — I10 Essential (primary) hypertension: Secondary | ICD-10-CM | POA: Diagnosis not present

## 2017-04-25 DIAGNOSIS — R739 Hyperglycemia, unspecified: Secondary | ICD-10-CM | POA: Diagnosis not present

## 2017-04-25 DIAGNOSIS — Z23 Encounter for immunization: Secondary | ICD-10-CM | POA: Diagnosis not present

## 2017-05-23 IMAGING — US US EXTREM LOW VENOUS*L*
1 series · 14 of 24 positions shown · non-contrast
Comparison: None.

CLINICAL DATA: 74-year-old male with left lower extremity swelling
for 2 weeks. Recent back surgery.



[Series 1: us extrem low venous*left* · 0.08mm/px · 14 of 31 slices shown]
[im 1/31]
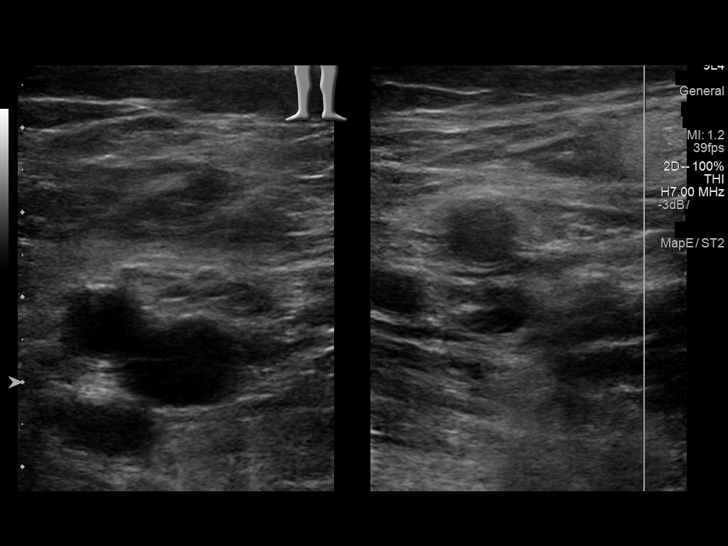
[im 3/31]
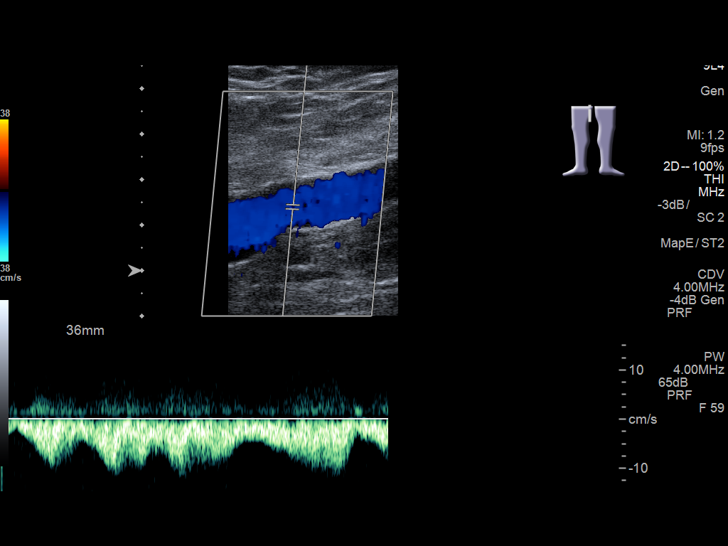
[im 6/31]
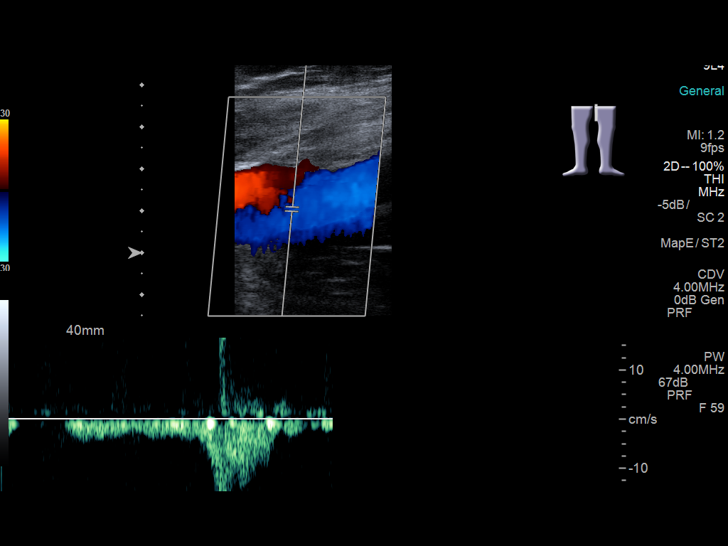
[im 8/31]
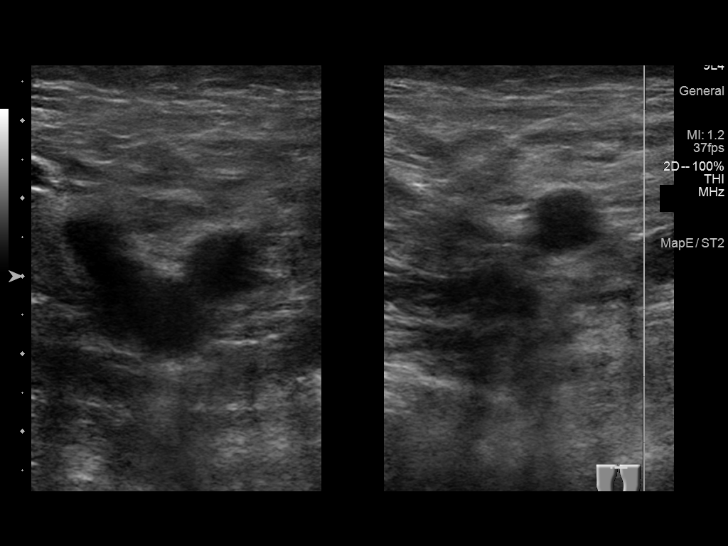
[im 10/31]
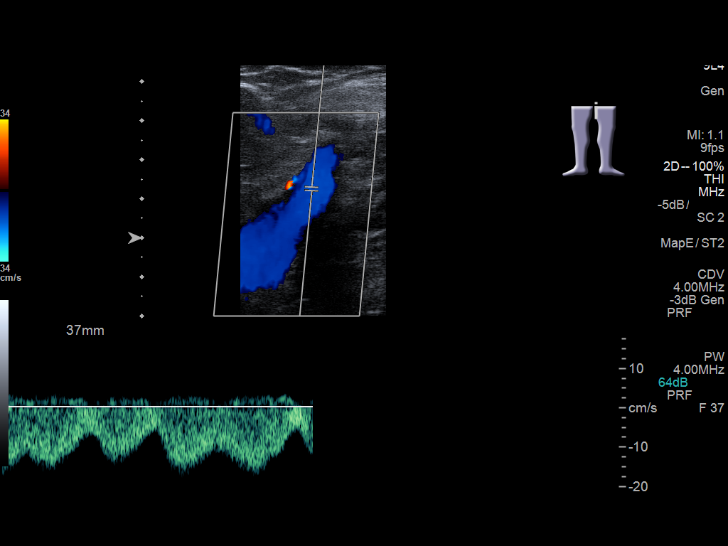
[im 12/31]
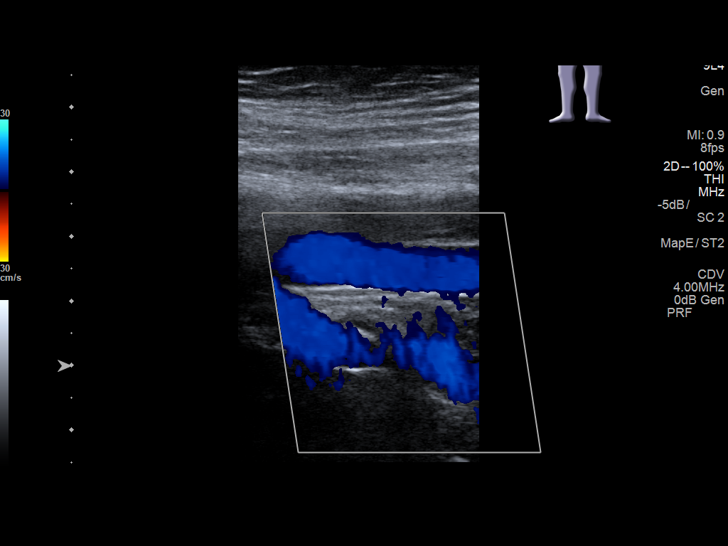
[im 15/31]
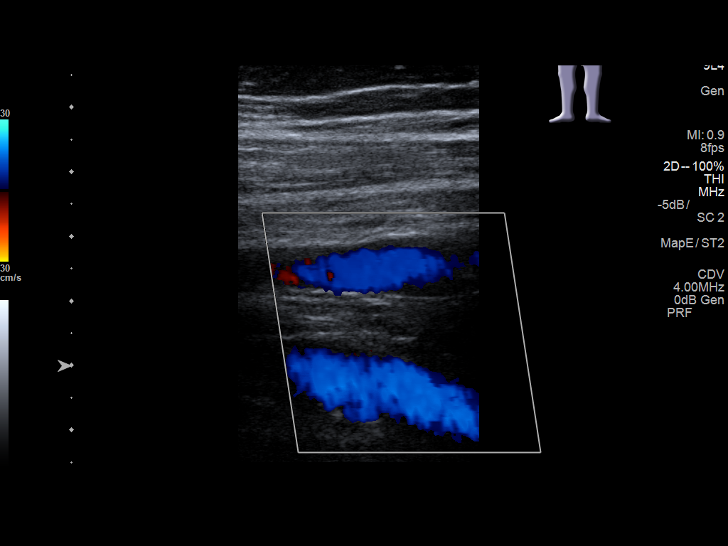
[im 16/31]
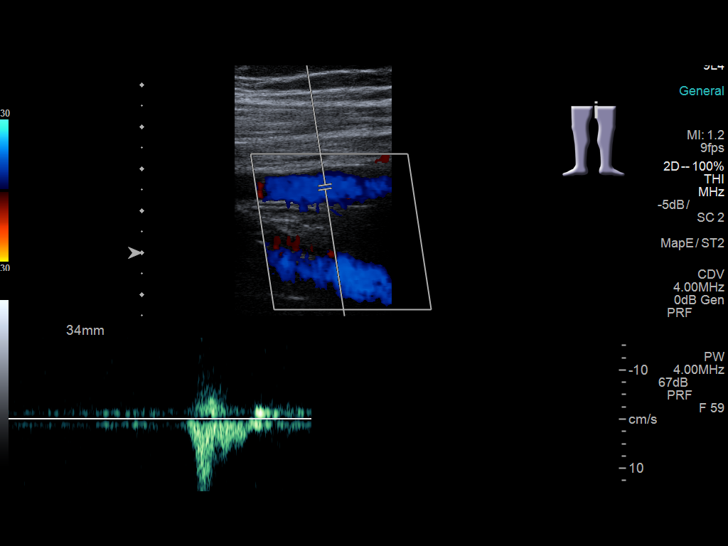
[im 19/31]
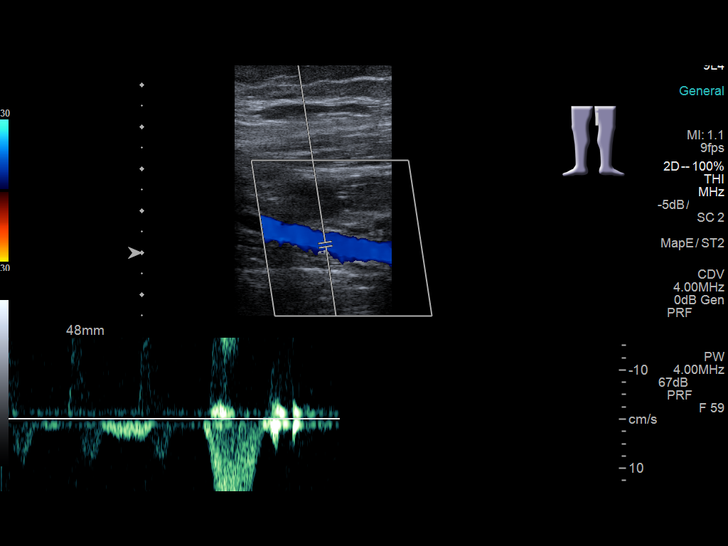
[im 21/31]
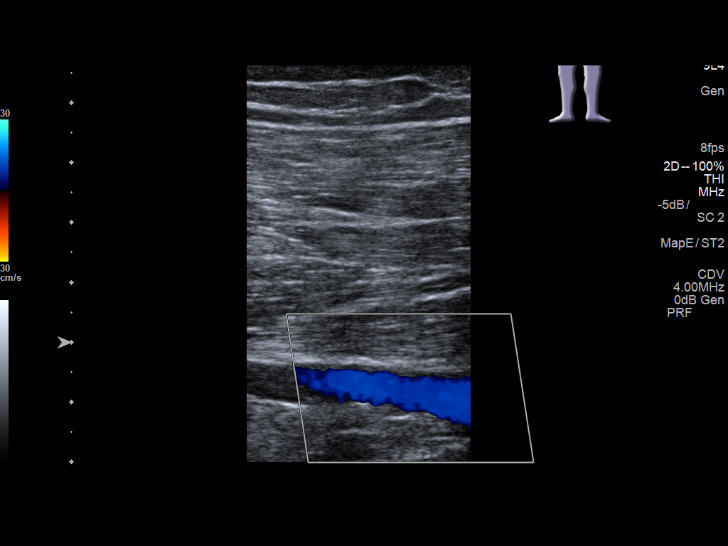
[im 24/31]
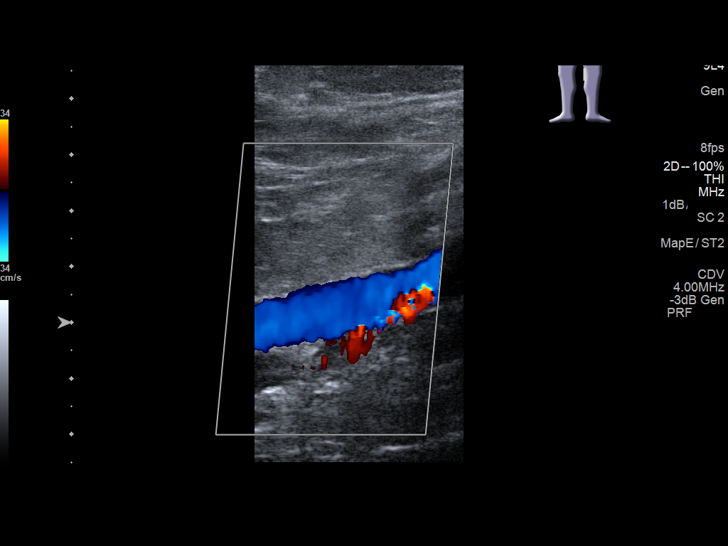
[im 25/31]
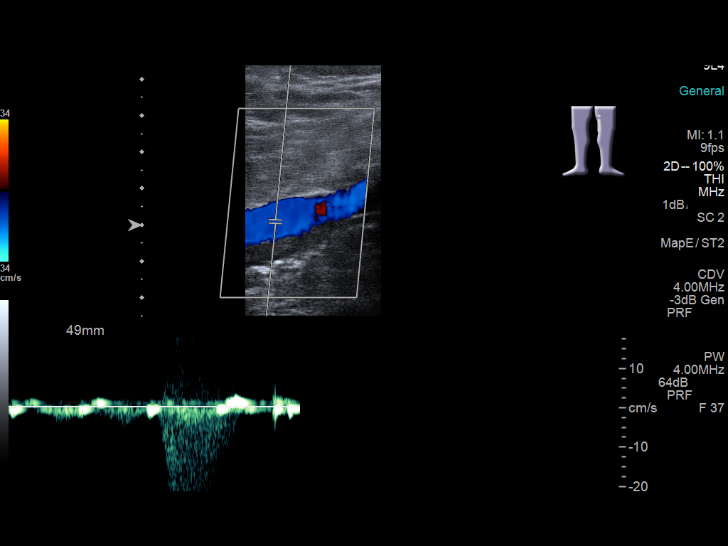
[im 28/31]
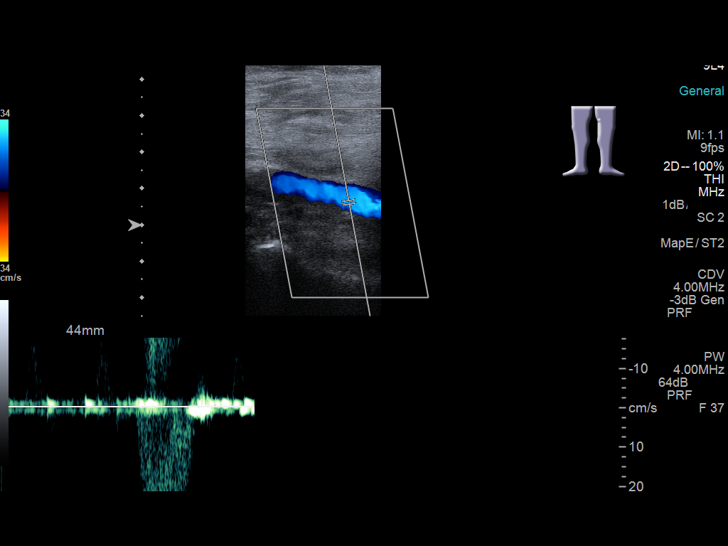
[im 31/31]
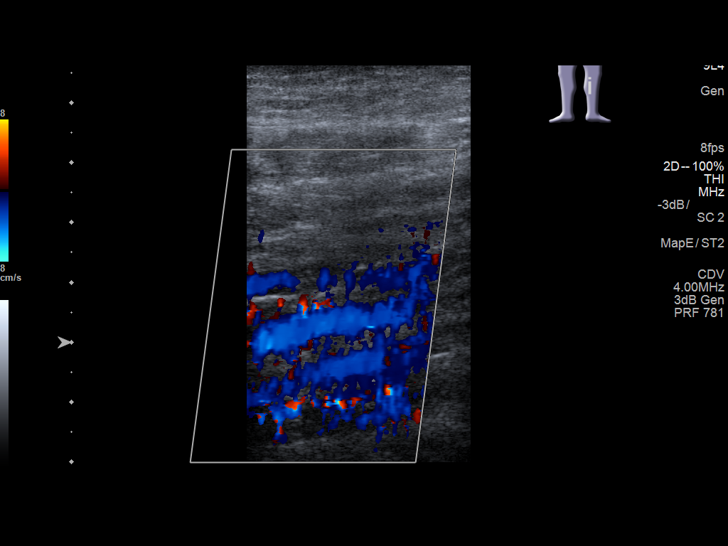

[14 of 24 positions shown; findings below may reference images not displayed]

FINDINGS: Normal flow, compressibility, and augmentation within the left
distal common femoral, proximal profunda femoral, proximal greater
saphenous, entire femoral, popliteal veins, and imaged calf veins.

There is no evidence of DVT within the contralateral right common
femoral vein.
IMPRESSION: No evidence of left lower extremity deep venous thrombosis.

## 2017-08-20 DIAGNOSIS — I10 Essential (primary) hypertension: Secondary | ICD-10-CM | POA: Diagnosis not present

## 2017-08-20 DIAGNOSIS — M5126 Other intervertebral disc displacement, lumbar region: Secondary | ICD-10-CM | POA: Diagnosis not present

## 2017-08-20 DIAGNOSIS — Z6839 Body mass index (BMI) 39.0-39.9, adult: Secondary | ICD-10-CM | POA: Diagnosis not present

## 2017-08-20 DIAGNOSIS — R739 Hyperglycemia, unspecified: Secondary | ICD-10-CM | POA: Diagnosis not present

## 2017-08-20 DIAGNOSIS — H918X3 Other specified hearing loss, bilateral: Secondary | ICD-10-CM | POA: Diagnosis not present

## 2017-08-27 DIAGNOSIS — E119 Type 2 diabetes mellitus without complications: Secondary | ICD-10-CM | POA: Diagnosis not present

## 2017-08-27 DIAGNOSIS — R600 Localized edema: Secondary | ICD-10-CM | POA: Diagnosis not present

## 2017-08-27 DIAGNOSIS — H918X3 Other specified hearing loss, bilateral: Secondary | ICD-10-CM | POA: Diagnosis not present

## 2017-08-27 DIAGNOSIS — I10 Essential (primary) hypertension: Secondary | ICD-10-CM | POA: Diagnosis not present

## 2017-12-28 DIAGNOSIS — I10 Essential (primary) hypertension: Secondary | ICD-10-CM | POA: Diagnosis not present

## 2017-12-28 DIAGNOSIS — E119 Type 2 diabetes mellitus without complications: Secondary | ICD-10-CM | POA: Diagnosis not present

## 2017-12-28 DIAGNOSIS — H918X3 Other specified hearing loss, bilateral: Secondary | ICD-10-CM | POA: Diagnosis not present

## 2018-01-04 DIAGNOSIS — G8929 Other chronic pain: Secondary | ICD-10-CM | POA: Diagnosis not present

## 2018-01-04 DIAGNOSIS — I10 Essential (primary) hypertension: Secondary | ICD-10-CM | POA: Diagnosis not present

## 2018-01-04 DIAGNOSIS — Z0000 Encounter for general adult medical examination without abnormal findings: Secondary | ICD-10-CM | POA: Diagnosis not present

## 2018-01-04 DIAGNOSIS — M5126 Other intervertebral disc displacement, lumbar region: Secondary | ICD-10-CM | POA: Diagnosis not present

## 2018-01-04 DIAGNOSIS — H918X3 Other specified hearing loss, bilateral: Secondary | ICD-10-CM | POA: Diagnosis not present

## 2018-01-04 DIAGNOSIS — E119 Type 2 diabetes mellitus without complications: Secondary | ICD-10-CM | POA: Diagnosis not present

## 2018-01-04 DIAGNOSIS — M5442 Lumbago with sciatica, left side: Secondary | ICD-10-CM | POA: Diagnosis not present

## 2018-05-01 DIAGNOSIS — I10 Essential (primary) hypertension: Secondary | ICD-10-CM | POA: Diagnosis not present

## 2018-05-01 DIAGNOSIS — M5442 Lumbago with sciatica, left side: Secondary | ICD-10-CM | POA: Diagnosis not present

## 2018-05-01 DIAGNOSIS — M5126 Other intervertebral disc displacement, lumbar region: Secondary | ICD-10-CM | POA: Diagnosis not present

## 2018-05-01 DIAGNOSIS — G8929 Other chronic pain: Secondary | ICD-10-CM | POA: Diagnosis not present

## 2018-05-01 DIAGNOSIS — E119 Type 2 diabetes mellitus without complications: Secondary | ICD-10-CM | POA: Diagnosis not present

## 2018-05-01 DIAGNOSIS — H918X3 Other specified hearing loss, bilateral: Secondary | ICD-10-CM | POA: Diagnosis not present

## 2018-05-08 DIAGNOSIS — H918X3 Other specified hearing loss, bilateral: Secondary | ICD-10-CM | POA: Diagnosis not present

## 2018-05-08 DIAGNOSIS — E119 Type 2 diabetes mellitus without complications: Secondary | ICD-10-CM | POA: Diagnosis not present

## 2018-05-08 DIAGNOSIS — Z6839 Body mass index (BMI) 39.0-39.9, adult: Secondary | ICD-10-CM | POA: Diagnosis not present

## 2018-05-08 DIAGNOSIS — E1165 Type 2 diabetes mellitus with hyperglycemia: Secondary | ICD-10-CM | POA: Diagnosis not present

## 2018-05-08 DIAGNOSIS — I10 Essential (primary) hypertension: Secondary | ICD-10-CM | POA: Diagnosis not present

## 2018-05-08 DIAGNOSIS — Z0000 Encounter for general adult medical examination without abnormal findings: Secondary | ICD-10-CM | POA: Diagnosis not present

## 2018-06-26 DEATH — deceased

## 2018-08-26 DIAGNOSIS — H16223 Keratoconjunctivitis sicca, not specified as Sjogren's, bilateral: Secondary | ICD-10-CM | POA: Diagnosis not present

## 2018-09-03 DIAGNOSIS — Z6839 Body mass index (BMI) 39.0-39.9, adult: Secondary | ICD-10-CM | POA: Diagnosis not present

## 2018-09-03 DIAGNOSIS — E1165 Type 2 diabetes mellitus with hyperglycemia: Secondary | ICD-10-CM | POA: Diagnosis not present

## 2018-09-03 DIAGNOSIS — Z0000 Encounter for general adult medical examination without abnormal findings: Secondary | ICD-10-CM | POA: Diagnosis not present

## 2018-09-03 DIAGNOSIS — I10 Essential (primary) hypertension: Secondary | ICD-10-CM | POA: Diagnosis not present

## 2018-09-03 DIAGNOSIS — H918X3 Other specified hearing loss, bilateral: Secondary | ICD-10-CM | POA: Diagnosis not present

## 2018-09-03 DIAGNOSIS — Z125 Encounter for screening for malignant neoplasm of prostate: Secondary | ICD-10-CM | POA: Diagnosis not present

## 2018-09-10 DIAGNOSIS — G8929 Other chronic pain: Secondary | ICD-10-CM | POA: Diagnosis not present

## 2018-09-10 DIAGNOSIS — E1165 Type 2 diabetes mellitus with hyperglycemia: Secondary | ICD-10-CM | POA: Diagnosis not present

## 2018-09-10 DIAGNOSIS — Z0000 Encounter for general adult medical examination without abnormal findings: Secondary | ICD-10-CM | POA: Diagnosis not present

## 2018-09-10 DIAGNOSIS — H9193 Unspecified hearing loss, bilateral: Secondary | ICD-10-CM | POA: Diagnosis not present

## 2018-09-10 DIAGNOSIS — M5442 Lumbago with sciatica, left side: Secondary | ICD-10-CM | POA: Diagnosis not present

## 2018-09-10 DIAGNOSIS — I10 Essential (primary) hypertension: Secondary | ICD-10-CM | POA: Diagnosis not present

## 2018-11-14 DIAGNOSIS — R8290 Unspecified abnormal findings in urine: Secondary | ICD-10-CM | POA: Diagnosis not present

## 2018-11-14 DIAGNOSIS — R319 Hematuria, unspecified: Secondary | ICD-10-CM | POA: Diagnosis not present

## 2019-01-17 DIAGNOSIS — M5442 Lumbago with sciatica, left side: Secondary | ICD-10-CM | POA: Diagnosis not present

## 2019-01-17 DIAGNOSIS — G8929 Other chronic pain: Secondary | ICD-10-CM | POA: Diagnosis not present

## 2019-01-17 DIAGNOSIS — H9193 Unspecified hearing loss, bilateral: Secondary | ICD-10-CM | POA: Diagnosis not present

## 2019-01-17 DIAGNOSIS — I10 Essential (primary) hypertension: Secondary | ICD-10-CM | POA: Diagnosis not present

## 2019-01-17 DIAGNOSIS — E1165 Type 2 diabetes mellitus with hyperglycemia: Secondary | ICD-10-CM | POA: Diagnosis not present

## 2019-02-19 DIAGNOSIS — H9193 Unspecified hearing loss, bilateral: Secondary | ICD-10-CM | POA: Diagnosis not present

## 2019-02-19 DIAGNOSIS — G8929 Other chronic pain: Secondary | ICD-10-CM | POA: Diagnosis not present

## 2019-02-19 DIAGNOSIS — M5442 Lumbago with sciatica, left side: Secondary | ICD-10-CM | POA: Diagnosis not present

## 2019-02-19 DIAGNOSIS — R311 Benign essential microscopic hematuria: Secondary | ICD-10-CM | POA: Diagnosis not present

## 2019-02-19 DIAGNOSIS — E1165 Type 2 diabetes mellitus with hyperglycemia: Secondary | ICD-10-CM | POA: Diagnosis not present

## 2019-02-19 DIAGNOSIS — Z6839 Body mass index (BMI) 39.0-39.9, adult: Secondary | ICD-10-CM | POA: Diagnosis not present

## 2019-02-19 DIAGNOSIS — I10 Essential (primary) hypertension: Secondary | ICD-10-CM | POA: Diagnosis not present

## 2019-06-24 DIAGNOSIS — H9193 Unspecified hearing loss, bilateral: Secondary | ICD-10-CM | POA: Diagnosis not present

## 2019-06-24 DIAGNOSIS — E1165 Type 2 diabetes mellitus with hyperglycemia: Secondary | ICD-10-CM | POA: Diagnosis not present

## 2019-06-24 DIAGNOSIS — G8929 Other chronic pain: Secondary | ICD-10-CM | POA: Diagnosis not present

## 2019-06-24 DIAGNOSIS — M5442 Lumbago with sciatica, left side: Secondary | ICD-10-CM | POA: Diagnosis not present

## 2019-06-24 DIAGNOSIS — I10 Essential (primary) hypertension: Secondary | ICD-10-CM | POA: Diagnosis not present

## 2019-06-24 DIAGNOSIS — R311 Benign essential microscopic hematuria: Secondary | ICD-10-CM | POA: Diagnosis not present

## 2019-10-26 ENCOUNTER — Emergency Department: Payer: PPO

## 2019-10-26 ENCOUNTER — Observation Stay
Admit: 2019-10-26 | Discharge: 2019-10-26 | Disposition: A | Discharge status: Home / Self Care | Payer: PPO | Attending: Internal Medicine | Admitting: Internal Medicine

## 2019-10-26 ENCOUNTER — Observation Stay: Payer: PPO

## 2019-10-26 ENCOUNTER — Inpatient Hospital Stay
Admission: EM | Admit: 2019-10-26 | Discharge: 2019-10-28 | DRG: 065 | Disposition: A | Discharge status: Rehabilitation Facility | Payer: PPO | Attending: Internal Medicine | Admitting: Internal Medicine

## 2019-10-26 ENCOUNTER — Inpatient Hospital Stay: Payer: PPO

## 2019-10-26 ENCOUNTER — Other Ambulatory Visit: Payer: Self-pay

## 2019-10-26 ENCOUNTER — Encounter: Payer: Self-pay | Admitting: Radiology

## 2019-10-26 DIAGNOSIS — R531 Weakness: Secondary | ICD-10-CM | POA: Diagnosis not present

## 2019-10-26 DIAGNOSIS — R52 Pain, unspecified: Secondary | ICD-10-CM

## 2019-10-26 DIAGNOSIS — M549 Dorsalgia, unspecified: Secondary | ICD-10-CM | POA: Diagnosis not present

## 2019-10-26 DIAGNOSIS — I6502 Occlusion and stenosis of left vertebral artery: Secondary | ICD-10-CM | POA: Diagnosis not present

## 2019-10-26 DIAGNOSIS — I10 Essential (primary) hypertension: Secondary | ICD-10-CM | POA: Diagnosis not present

## 2019-10-26 DIAGNOSIS — Z6839 Body mass index (BMI) 39.0-39.9, adult: Secondary | ICD-10-CM

## 2019-10-26 DIAGNOSIS — Z79891 Long term (current) use of opiate analgesic: Secondary | ICD-10-CM

## 2019-10-26 DIAGNOSIS — E1140 Type 2 diabetes mellitus with diabetic neuropathy, unspecified: Secondary | ICD-10-CM | POA: Diagnosis not present

## 2019-10-26 DIAGNOSIS — K219 Gastro-esophageal reflux disease without esophagitis: Secondary | ICD-10-CM | POA: Diagnosis not present

## 2019-10-26 DIAGNOSIS — H9190 Unspecified hearing loss, unspecified ear: Secondary | ICD-10-CM | POA: Diagnosis present

## 2019-10-26 DIAGNOSIS — I639 Cerebral infarction, unspecified: Secondary | ICD-10-CM

## 2019-10-26 DIAGNOSIS — R29707 NIHSS score 7: Secondary | ICD-10-CM | POA: Diagnosis not present

## 2019-10-26 DIAGNOSIS — Z791 Long term (current) use of non-steroidal anti-inflammatories (NSAID): Secondary | ICD-10-CM | POA: Diagnosis not present

## 2019-10-26 DIAGNOSIS — R4781 Slurred speech: Secondary | ICD-10-CM | POA: Diagnosis present

## 2019-10-26 DIAGNOSIS — G8929 Other chronic pain: Secondary | ICD-10-CM | POA: Diagnosis present

## 2019-10-26 DIAGNOSIS — R600 Localized edema: Secondary | ICD-10-CM | POA: Diagnosis not present

## 2019-10-26 DIAGNOSIS — Z7982 Long term (current) use of aspirin: Secondary | ICD-10-CM | POA: Diagnosis not present

## 2019-10-26 DIAGNOSIS — R29705 NIHSS score 5: Secondary | ICD-10-CM | POA: Diagnosis present

## 2019-10-26 DIAGNOSIS — E782 Mixed hyperlipidemia: Secondary | ICD-10-CM | POA: Diagnosis not present

## 2019-10-26 DIAGNOSIS — R29810 Facial weakness: Secondary | ICD-10-CM | POA: Diagnosis present

## 2019-10-26 DIAGNOSIS — G8191 Hemiplegia, unspecified affecting right dominant side: Secondary | ICD-10-CM | POA: Diagnosis present

## 2019-10-26 DIAGNOSIS — I63443 Cerebral infarction due to embolism of bilateral cerebellar arteries: Secondary | ICD-10-CM | POA: Diagnosis not present

## 2019-10-26 DIAGNOSIS — E669 Obesity, unspecified: Secondary | ICD-10-CM | POA: Diagnosis present

## 2019-10-26 DIAGNOSIS — E785 Hyperlipidemia, unspecified: Secondary | ICD-10-CM | POA: Diagnosis not present

## 2019-10-26 DIAGNOSIS — I6389 Other cerebral infarction: Secondary | ICD-10-CM | POA: Diagnosis not present

## 2019-10-26 DIAGNOSIS — Z79899 Other long term (current) drug therapy: Secondary | ICD-10-CM | POA: Diagnosis not present

## 2019-10-26 DIAGNOSIS — Z20822 Contact with and (suspected) exposure to covid-19: Secondary | ICD-10-CM | POA: Diagnosis not present

## 2019-10-26 DIAGNOSIS — M7989 Other specified soft tissue disorders: Secondary | ICD-10-CM

## 2019-10-26 DIAGNOSIS — E1169 Type 2 diabetes mellitus with other specified complication: Secondary | ICD-10-CM | POA: Diagnosis not present

## 2019-10-26 DIAGNOSIS — Z888 Allergy status to other drugs, medicaments and biological substances status: Secondary | ICD-10-CM

## 2019-10-26 DIAGNOSIS — R29818 Other symptoms and signs involving the nervous system: Secondary | ICD-10-CM | POA: Diagnosis not present

## 2019-10-26 LAB — URINALYSIS, ROUTINE W REFLEX MICROSCOPIC
Bacteria, UA: NONE SEEN
Bilirubin Urine: NEGATIVE
Glucose, UA: NEGATIVE mg/dL
Hgb urine dipstick: NEGATIVE
Ketones, ur: NEGATIVE mg/dL
Leukocytes,Ua: NEGATIVE
Nitrite: NEGATIVE
Protein, ur: NEGATIVE mg/dL
Specific Gravity, Urine: 1.02 (ref 1.005–1.030)
Squamous Epithelial / HPF: NONE SEEN (ref 0–5)
pH: 7 (ref 5.0–8.0)

## 2019-10-26 LAB — URINE DRUG SCREEN, QUALITATIVE (ARMC ONLY)
Amphetamines, Ur Screen: NOT DETECTED
Barbiturates, Ur Screen: NOT DETECTED
Benzodiazepine, Ur Scrn: NOT DETECTED
Cannabinoid 50 Ng, Ur ~~LOC~~: NOT DETECTED
Cocaine Metabolite,Ur ~~LOC~~: NOT DETECTED
MDMA (Ecstasy)Ur Screen: NOT DETECTED
Methadone Scn, Ur: NOT DETECTED
Opiate, Ur Screen: NOT DETECTED
Phencyclidine (PCP) Ur S: NOT DETECTED
Tricyclic, Ur Screen: NOT DETECTED

## 2019-10-26 LAB — HEMOGLOBIN A1C
Hgb A1c MFr Bld: 6.6 % — ABNORMAL HIGH (ref 4.8–5.6)
Mean Plasma Glucose: 142.72 mg/dL

## 2019-10-26 LAB — LIPID PANEL
Cholesterol: 205 mg/dL — ABNORMAL HIGH (ref 0–200)
HDL: 56 mg/dL (ref >40)
LDL Cholesterol: 133 mg/dL — ABNORMAL HIGH (ref 0–99)
Total CHOL/HDL Ratio: 3.7 RATIO
Triglycerides: 80 mg/dL (ref <150)
VLDL: 16 mg/dL (ref 0–40)

## 2019-10-26 LAB — ETHANOL: Alcohol, Ethyl (B): <10 mg/dL (ref <10)

## 2019-10-26 LAB — CBC
HCT: 43.2 % (ref 39.0–52.0)
Hemoglobin: 14.8 g/dL (ref 13.0–17.0)
MCH: 31.7 pg (ref 26.0–34.0)
MCHC: 34.3 g/dL (ref 30.0–36.0)
MCV: 92.5 fL (ref 80.0–100.0)
Platelets: 143 10*3/uL — ABNORMAL LOW (ref 150–400)
RBC: 4.67 MIL/uL (ref 4.22–5.81)
RDW: 13.3 % (ref 11.5–15.5)
WBC: 5.6 10*3/uL (ref 4.0–10.5)
nRBC: 0 % (ref 0.0–0.2)

## 2019-10-26 LAB — GLUCOSE, CAPILLARY
Glucose-Capillary: 124 mg/dL — ABNORMAL HIGH (ref 70–99)
Glucose-Capillary: 119 mg/dL — ABNORMAL HIGH (ref 70–99)
Glucose-Capillary: 109 mg/dL — ABNORMAL HIGH (ref 70–99)
Glucose-Capillary: 116 mg/dL — ABNORMAL HIGH (ref 70–99)

## 2019-10-26 LAB — RESPIRATORY PANEL BY RT PCR (FLU A&B, COVID)
Influenza A by PCR: NEGATIVE
Influenza B by PCR: NEGATIVE
SARS Coronavirus 2 by RT PCR: NEGATIVE

## 2019-10-26 LAB — DIFFERENTIAL
Abs Immature Granulocytes: 0.01 10*3/uL (ref 0.00–0.07)
Basophils Absolute: 0 10*3/uL (ref 0.0–0.1)
Basophils Relative: 1 %
Eosinophils Absolute: 0.1 10*3/uL (ref 0.0–0.5)
Eosinophils Relative: 1 %
Immature Granulocytes: 0 %
Lymphocytes Relative: 22 %
Lymphs Abs: 1.3 10*3/uL (ref 0.7–4.0)
Monocytes Absolute: 0.8 10*3/uL (ref 0.1–1.0)
Monocytes Relative: 14 %
Neutro Abs: 3.5 10*3/uL (ref 1.7–7.7)
Neutrophils Relative %: 62 %

## 2019-10-26 LAB — COMPREHENSIVE METABOLIC PANEL
ALT: 37 U/L (ref 0–44)
AST: 51 U/L — ABNORMAL HIGH (ref 15–41)
Albumin: 4 g/dL (ref 3.5–5.0)
Alkaline Phosphatase: 90 U/L (ref 38–126)
Anion gap: 10 (ref 5–15)
BUN: 27 mg/dL — ABNORMAL HIGH (ref 8–23)
CO2: 23 mmol/L (ref 22–32)
Calcium: 9.5 mg/dL (ref 8.9–10.3)
Chloride: 104 mmol/L (ref 98–111)
Creatinine, Ser: 1.2 mg/dL (ref 0.61–1.24)
GFR calc Af Amer: >60 mL/min (ref >60)
GFR calc non Af Amer: 57 mL/min — ABNORMAL LOW (ref >60)
Glucose, Bld: 136 mg/dL — ABNORMAL HIGH (ref 70–99)
Potassium: 4.2 mmol/L (ref 3.5–5.1)
Sodium: 137 mmol/L (ref 135–145)
Total Bilirubin: 0.9 mg/dL (ref 0.3–1.2)
Total Protein: 7.6 g/dL (ref 6.5–8.1)

## 2019-10-26 LAB — APTT: aPTT: 35 seconds (ref 24–36)

## 2019-10-26 LAB — PROTIME-INR
INR: 1.2 (ref 0.8–1.2)
Prothrombin Time: 14.6 seconds (ref 11.4–15.2)

## 2019-10-26 MED ORDER — INSULIN ASPART 100 UNIT/ML ~~LOC~~ SOLN: 0.0000 [IU] | Freq: Every day | SUBCUTANEOUS | Status: DC

## 2019-10-26 MED ORDER — PERFLUTREN LIPID MICROSPHERE
1.0000 mL | INTRAVENOUS | Status: AC | PRN
  Administered 2019-10-26: 5 mL via INTRAVENOUS
  Filled 2019-10-26: qty 10

## 2019-10-26 MED ORDER — STROKE: EARLY STAGES OF RECOVERY BOOK: Freq: Once | Status: DC

## 2019-10-26 MED ORDER — DOCUSATE SODIUM 100 MG PO CAPS
100.0000 mg | ORAL_CAPSULE | Freq: Two times a day (BID) | ORAL | Status: DC
  Administered 2019-10-26 – 2019-10-28 (×4): 100 mg via ORAL
  Filled 2019-10-26 (×4): qty 1

## 2019-10-26 MED ORDER — ACETAMINOPHEN 650 MG RE SUPP: 650.0000 mg | RECTAL | Status: DC | PRN

## 2019-10-26 MED ORDER — ATORVASTATIN CALCIUM 20 MG PO TABS
40.0000 mg | ORAL_TABLET | ORAL | Status: DC
  Administered 2019-10-26 – 2019-10-28 (×3): 40 mg via ORAL
  Filled 2019-10-26 (×3): qty 2

## 2019-10-26 MED ORDER — GABAPENTIN 300 MG PO CAPS
600.0000 mg | ORAL_CAPSULE | Freq: Three times a day (TID) | ORAL | Status: DC
  Administered 2019-10-26 – 2019-10-28 (×5): 600 mg via ORAL
  Filled 2019-10-26 (×5): qty 2

## 2019-10-26 MED ORDER — INSULIN ASPART 100 UNIT/ML ~~LOC~~ SOLN
0.0000 [IU] | Freq: Three times a day (TID) | SUBCUTANEOUS | Status: DC
  Administered 2019-10-26 – 2019-10-27 (×3): 2 [IU] via SUBCUTANEOUS
  Filled 2019-10-26 (×3): qty 1

## 2019-10-26 MED ORDER — CLOPIDOGREL BISULFATE 75 MG PO TABS
75.0000 mg | ORAL_TABLET | Freq: Every day | ORAL | Status: DC
  Administered 2019-10-26 – 2019-10-28 (×3): 75 mg via ORAL
  Filled 2019-10-26 (×3): qty 1

## 2019-10-26 MED ORDER — LORATADINE 10 MG PO TABS
10.0000 mg | ORAL_TABLET | Freq: Every day | ORAL | Status: DC
  Administered 2019-10-26 – 2019-10-27 (×2): 10 mg via ORAL
  Filled 2019-10-26 (×2): qty 1

## 2019-10-26 MED ORDER — CYCLOBENZAPRINE HCL 10 MG PO TABS
10.0000 mg | ORAL_TABLET | Freq: Three times a day (TID) | ORAL | Status: DC | PRN
  Administered 2019-10-28: 10 mg via ORAL
  Filled 2019-10-26: qty 1

## 2019-10-26 MED ORDER — SODIUM CHLORIDE 0.9 % IV SOLN: INTRAVENOUS | Status: DC

## 2019-10-26 MED ORDER — ENOXAPARIN SODIUM 40 MG/0.4ML ~~LOC~~ SOLN
40.0000 mg | SUBCUTANEOUS | Status: DC
  Administered 2019-10-26 – 2019-10-28 (×3): 40 mg via SUBCUTANEOUS
  Filled 2019-10-26 (×3): qty 0.4

## 2019-10-26 MED ORDER — ASPIRIN EC 81 MG PO TBEC
81.0000 mg | DELAYED_RELEASE_TABLET | Freq: Every day | ORAL | Status: DC
  Administered 2019-10-26 – 2019-10-28 (×3): 81 mg via ORAL
  Filled 2019-10-26 (×3): qty 1

## 2019-10-26 MED ORDER — ACETAMINOPHEN 160 MG/5ML PO SOLN
650.0000 mg | ORAL | Status: DC | PRN
  Filled 2019-10-26: qty 20.3

## 2019-10-26 MED ORDER — SENNOSIDES-DOCUSATE SODIUM 8.6-50 MG PO TABS: 1.0000 | ORAL_TABLET | Freq: Every evening | ORAL | Status: DC | PRN

## 2019-10-26 MED ORDER — ASPIRIN 300 MG RE SUPP: 300.0000 mg | Freq: Once | RECTAL | Status: DC

## 2019-10-26 MED ORDER — ACETAMINOPHEN 325 MG PO TABS
650.0000 mg | ORAL_TABLET | ORAL | Status: DC | PRN
  Administered 2019-10-28: 03:00:00 650 mg via ORAL
  Filled 2019-10-26: qty 2

## 2019-10-26 MED ORDER — IOHEXOL 350 MG/ML SOLN
100.0000 mL | Freq: Once | INTRAVENOUS | Status: AC | PRN
  Administered 2019-10-26: 100 mL via INTRAVENOUS

## 2019-10-26 NOTE — ED Notes (Signed)
See printed out vital sign sheet for blood pressures, blood pressures are not crossing over into computer with validating data.

## 2019-10-26 NOTE — ED Notes (Signed)
Transport to floor room 115.AS 

## 2019-10-26 NOTE — Evaluation (Signed)
Physical Therapy Evaluation Patient Details Name: Jordan Chang MRN: [id number] DOB: May 17, 1941 Today's Date: 10/26/2019   History of Present Illness  Pt is an 79 y.o. male presenting to hospital 5/2 with slurred speech, R sided weakness, and facial droop; also slight HA and double vision.  Pt admitted with acute CVA (MRI of brain showing few small acute to subacute infarcts involving multiple vascular territories).  PMH includes htn, DM, neuropathy, chronic back pain, h/o back surgery.  Clinical Impression  Prior to hospital admission, pt was modified independent ambulating with cane; lives with his wife in 1 level home with 3 steps to enter (R railing).  R UE and R LE weakness noted.  Currently pt is CGA to min assist semi-supine to sitting edge of bed; max progressing to mod assist standing from bed up to RW; and min to mod assist x1 to take steps bed to recliner with RW.  Pt's R knee mildly flexing during R LE stance phase (pt reports feeling that R knee could "buckle").  Pt very motivated to participate in therapy and do as much as he could during session to regain strength and independence.  Pt would benefit from skilled PT to address noted impairments and functional limitations (see below for any additional details).  Upon hospital discharge, pt would benefit from CIR.    Follow Up Recommendations CIR    Equipment Recommendations  (TBD at next facility)    Recommendations for Other Services OT consult     Precautions / Restrictions Precautions Precautions: Fall Precaution Comments: Aspiration Restrictions Weight Bearing Restrictions: No      Mobility  Bed Mobility Overal bed mobility: Needs Assistance Bed Mobility: Supine to Sit     Supine to sit: Min guard;Min assist;HOB elevated   General bed mobility comments: increased effort and time for pt to perform on own; vc's and assist to shift weight towards L side in order to shift R hips forward  Transfers Overall transfer  level: Needs assistance Equipment used: Rolling walker (2 wheeled) Transfers: Sit to/from Stand Sit to Stand: Max assist;Mod assist(bed height elevated about 1 inch)         General transfer comment: 1st trial standing max assist x1 and 2nd trial standing mod assist x1; vc's and tactile cues for UE/LE placement  Ambulation/Gait Ambulation/Gait assistance: Min assist;Mod assist Gait Distance (Feet): 3 Feet(bed to recliner) Assistive device: Rolling walker (2 wheeled)   Gait velocity: decreased   General Gait Details: R knee mildly flexing during R LE stance phase (pt reports feeling that R knee could "buckle"); vc's to shift weight to L in order to advance R LE; vc's and tactile cues for UE placement on RW; increased effort/time to take steps  Stairs            Wheelchair Mobility    Modified Rankin (Stroke Patients Only)       Balance Overall balance assessment: Needs assistance Sitting-balance support: No upper extremity supported;Feet supported Sitting balance-Leahy Scale: Good Sitting balance - Comments: steady sitting reaching within BOS   Standing balance support: Single extremity supported Standing balance-Leahy Scale: Poor Standing balance comment: pt requiring at least L UE support for static standing balance                             Pertinent Vitals/Pain Pain Assessment: Faces Faces Pain Scale: No hurt Pain Intervention(s): Limited activity within patient's tolerance;Monitored during session;Repositioned  Vitals (HR and O2 on  room air) stable and WFL throughout treatment session.    Home Living Family/patient expects to be discharged to:: Private residence Living Arrangements: Spouse/significant other Available Help at Discharge: Family;Available 24 hours/day Type of Home: House Home Access: Stairs to enter Entrance Stairs-Rails: Right Entrance Stairs-Number of Steps: 3 (from car-port) Home Layout: One level Home Equipment: Cane -  single point      Prior Function Level of Independence: Independent with assistive device(s)         Comments: Ambulates with SPC.     Hand Dominance   Dominant Hand: Right    Extremity/Trunk Assessment   Upper Extremity Assessment Upper Extremity Assessment: Defer to OT evaluation RUE Deficits / Details: Per OT eval: "R grip 3+/5. R shoulder AROM ~90*. Intact 5 finger opposition c increased time" RUE Sensation: (Pt reports paresthesias have resolved since last night) RUE Coordination: decreased fine motor    Lower Extremity Assessment Lower Extremity Assessment: RLE deficits/detail(L LE WFL) RLE Deficits / Details: hip flexion 3-/5; knee flexion 3+/5; knee extension 3-/5; DF at least 3/5 AROM(Intact proprioception, tone, and light touch) RLE Coordination: decreased gross motor    Cervical / Trunk Assessment Cervical / Trunk Assessment: Normal  Communication   Communication: HOH  Cognition Arousal/Alertness: Awake/alert Behavior During Therapy: WFL for tasks assessed/performed(appearing frustrated at times d/t situation/diagnosis) Overall Cognitive Status: Within Functional Limits for tasks assessed                                        General Comments General comments (skin integrity, edema, etc.): L LE increased swelling compared to R LE (pt reports this is baseline for him)    Exercises    Assessment/Plan    PT Assessment Patient needs continued PT services  PT Problem List Decreased strength;Decreased balance;Decreased mobility;Decreased coordination;Decreased knowledge of use of DME;Decreased knowledge of precautions;Pain       PT Treatment Interventions DME instruction;Gait training;Stair training;Functional mobility training;Therapeutic activities;Therapeutic exercise;Balance training;Patient/family education    PT Goals (Current goals can be found in the Care Plan section)  Acute Rehab PT Goals Patient Stated Goal: To get  stronger PT Goal Formulation: With patient Time For Goal Achievement: 11/09/19 Potential to Achieve Goals: Good    Frequency 7X/week   Barriers to discharge        Co-evaluation               AM-PAC PT "6 Clicks" Mobility  Outcome Measure Help needed turning from your back to your side while in a flat bed without using bedrails?: A Little Help needed moving from lying on your back to sitting on the side of a flat bed without using bedrails?: A Little Help needed moving to and from a bed to a chair (including a wheelchair)?: A Lot Help needed standing up from a chair using your arms (e.g., wheelchair or bedside chair)?: A Lot Help needed to walk in hospital room?: Total Help needed climbing 3-5 steps with a railing? : Total 6 Click Score: 12    End of Session Equipment Utilized During Treatment: Gait belt Activity Tolerance: Patient tolerated treatment well Patient left: in chair;with call bell/phone within reach;with chair alarm set Nurse Communication: Mobility status;Precautions PT Visit Diagnosis: Other abnormalities of gait and mobility (R26.89);Muscle weakness (generalized) (M62.81);Hemiplegia and hemiparesis Hemiplegia - Right/Left: Right Hemiplegia - dominant/non-dominant: Dominant Hemiplegia - caused by: Cerebral infarction    Time: 4650-3546 PT Time Calculation (  min) (ACUTE ONLY): 34 min   Charges:   PT Evaluation $PT Eval Low Complexity: 1 Low PT Treatments $Therapeutic Activity: 8-22 mins        Hendricks Limes, PT 10/26/19, 12:32 PM

## 2019-10-26 NOTE — Progress Notes (Signed)
Pt in MRI at start of shift. Unable to complete assessment.

## 2019-10-26 NOTE — H&P (Signed)
History and Physical    NIHAR KLUS CVE:[id number] DOB: February 03, 1941 DOA: 10/26/2019  PCP: Barbette Reichmann, MD   Patient coming from: Home  I have personally briefly reviewed patient's old medical records in [facility]  Chief Complaint: Slurred speech weakness on the right side  HPI: PIUS BYROM is a 79 y.o. male with medical history significant for hypertension, diabetes, neuropathy, chronic back pain who was brought to the emergency room with  onset slurred speech with weakness on the right leg on standing and weakness right arm as noticed by his wife who also noted that he had a droop on the right side of his face.  Symptoms started at 8:30 PM the day prior to arrival .patient said the symptoms were preceded by a slight headache and double vision.  According to wife at bedside, symptoms had improved a lot by arrival in the emergency room  ED Course: On arrival in the emergency room vitals were within normal limits BP 159/67.  .  Code stroke was called with initial CT head showing no acute intracranial findings.  Teleneurology was called but did not consult as patient was out of TPA window with symptoms started at 6:30 PM, more than 4 hours prior to arrival.  He went on to have CTA head and neck that showed no emergent LVO or high-grade stenosis of the intracranial arteries and no acute infarct by CT perfusion analysis.  Blood work mostly unremarkable with negative EtOH level negative drug screen negative UA.   Review of Systems: As per HPI otherwise 10 point review of systems negative.    Past Medical History:  Diagnosis Date  . GERD (gastroesophageal reflux disease)    occ  . Hyperlipemia   . Hypertension     Past Surgical History:  Procedure Laterality Date  . BACK SURGERY  05/2011  . EYE SURGERY     cataracts  . LUMBAR LAMINECTOMY/DECOMPRESSION MICRODISCECTOMY Left 01/20/2016   Procedure: Left Lumbar two-three, Lumbar three-four,  Lumbar four-five Microdiskectomy;   Surgeon: Tressie Stalker, MD;  Location: [facility];  Service: Neurosurgery;  Laterality: Left;     reports that he has never smoked. He has never used smokeless tobacco. He reports that he does not use drugs. No history on file for alcohol.  Allergies  Allergen Reactions  . Other Other (See Comments)    STEROIDS, not recorded, caused tachycardia Pt "does not know name"    No family history on file.   Prior to Admission medications   Medication Sig Start Date End Date Taking? Authorizing Provider  aspirin EC 81 MG tablet Take 81 mg by mouth every morning.     [provider]  atorvastatin (LIPITOR) 10 MG tablet Take 10 mg by mouth every other day. 11/24/15 11/23/16  [provider]  Cholecalciferol (D3-1000 PO) Take 1,000 mcg by mouth daily.     [provider]  Cholecalciferol (VITAMIN D-1000 MAX ST) 1000 units tablet Take 2,000 Units by mouth daily.    [provider]  cyclobenzaprine (FLEXERIL) 10 MG tablet Take 1 tablet (10 mg total) by mouth 3 (three) times daily as needed for muscle spasms. 01/21/16   Tressie Stalker, MD  docusate sodium (COLACE) 100 MG capsule Take 1 capsule (100 mg total) by mouth 2 (two) times daily. 01/21/16   Tressie Stalker, MD  gabapentin (NEURONTIN) 300 MG capsule Take 600 mg by mouth 3 (three) times daily.     [provider]  ibuprofen (GOODSENSE IBUPROFEN) 200  MG tablet Take 600 mg by mouth 3 (three) times daily.    [provider]  losartan (COZAAR) 50 MG tablet Take 50 mg by mouth daily.    [provider]  oxyCODONE-acetaminophen (PERCOCET/ROXICET) 5-325 MG tablet Take 1-2 tablets by mouth every 4 (four) hours as needed for moderate pain. 01/23/16   Houston Siren, MD  PEPCID 20 MG tablet Take 20 mg by mouth daily as needed for heartburn or indigestion.  10/12/15   [provider]  traMADol (ULTRAM) 50 MG tablet Take 1 tablet (50 mg total) by mouth every 6 (six) hours as  needed. Patient not taking: Reported on 02/14/2016 01/23/16   Houston Siren, MD  triamterene-hydrochlorothiazide (MAXZIDE-25) 37.5-25 MG tablet Take 1 tablet by mouth daily.    [provider]  vitamin B-12 (CYANOCOBALAMIN) 1000 MCG tablet Take 1,000 mcg by mouth daily.    [provider]    Physical Exam: Vitals:   10/26/19 0215 10/26/19 0245 10/26/19 0330 10/26/19 0345  BP: (!) 159/69     Pulse: 98  98 97  Resp: (!) 24 (!) 21 (!) 23 (!) 21  Temp: 98 F (36.7 C)     TempSrc: Axillary     SpO2: 94%  95% 94%  Weight: 124.7 kg     Height:  (1.803 m)        Vitals:   10/26/19 0215 10/26/19 0245 10/26/19 0330 10/26/19 0345  BP: (!) 159/69     Pulse: 98  98 97  Resp: (!) 24 (!) 21 (!) 23 (!) 21  Temp: 98 F (36.7 C)     TempSrc: Axillary     SpO2: 94%  95% 94%  Weight: 124.7 kg     Height:  (1.803 m)       Constitutional: Alert and awake, oriented x3, not in any acute distress. Eyes: PERLA, EOMI, irises appear normal, anicteric sclera,  ENMT: external ears and nose appear normal, normal hearing             Lips appears normal, oropharynx mucosa, tongue, posterior pharynx appear normal  Neck: neck appears normal, no masses, normal ROM, no thyromegaly, no JVD  CVS: S1-S2 clear, no murmur rubs or gallops,  , no carotid bruits, pedal pulses palpable, No LE edema Respiratory:  clear to auscultation bilaterally, no wheezing, rales or rhonchi. Respiratory effort normal. No accessory muscle use.  Abdomen: soft nontender, nondistended, normal bowel sounds, no hepatosplenomegaly, no hernias Musculoskeletal: : no cyanosis, clubbing , no contractures or atrophy Neuro: Right facial droop.  Slurred speech.  4 out of 5 strength right upper and lower extremity Psych: judgement and insight appear normal, stable mood and affect,  Skin: no rashes or lesions or ulcers, no induration or nodules   Labs on Admission: I have personally reviewed following labs and  imaging studies  CBC: Recent Labs  Lab 10/26/19 0214  WBC 5.6  NEUTROABS 3.5  HGB 14.8  HCT 43.2  MCV 92.5  PLT 143*   Basic Metabolic Panel: Recent Labs  Lab 10/26/19 0214  NA 137  K 4.2  CL 104  CO2 23  GLUCOSE 136*  BUN 27*  CREATININE 1.20  CALCIUM 9.5   GFR: Estimated Creatinine Clearance: 66 mL/min (by C-G formula based on SCr of 1.2 mg/dL). Liver Function Tests: Recent Labs  Lab 10/26/19 0214  AST 51*  ALT 37  ALKPHOS 90  BILITOT 0.9  PROT 7.6  ALBUMIN 4.0   No results for  input(s): LIPASE, AMYLASE in the last 168 hours. No results for input(s): AMMONIA in the last 168 hours. Coagulation Profile: Recent Labs  Lab 10/26/19 0214  INR 1.2   Cardiac Enzymes: No results for input(s): CKTOTAL, CKMB, CKMBINDEX, TROPONINI in the last 168 hours. BNP (last 3 results) No results for input(s): PROBNP in the last 8760 hours. HbA1C: No results for input(s): HGBA1C in the last 72 hours. CBG: No results for input(s): GLUCAP in the last 168 hours. Lipid Profile: No results for input(s): CHOL, HDL, LDLCALC, TRIG, CHOLHDL, LDLDIRECT in the last 72 hours. Thyroid Function Tests: No results for input(s): TSH, T4TOTAL, FREET4, T3FREE, THYROIDAB in the last 72 hours. Anemia Panel: No results for input(s): VITAMINB12, FOLATE, FERRITIN, TIBC, IRON, RETICCTPCT in the last 72 hours. Urine analysis:    Component Value Date/Time   COLORURINE YELLOW (A) 10/26/2019 0340   APPEARANCEUR CLEAR (A) 10/26/2019 0340   LABSPEC 1.020 10/26/2019 0340   PHURINE 7.0 10/26/2019 0340   GLUCOSEU NEGATIVE 10/26/2019 0340   HGBUR NEGATIVE 10/26/2019 0340   BILIRUBINUR NEGATIVE 10/26/2019 0340   KETONESUR NEGATIVE 10/26/2019 0340   PROTEINUR NEGATIVE 10/26/2019 0340   NITRITE NEGATIVE 10/26/2019 0340   LEUKOCYTESUR NEGATIVE 10/26/2019 0340    Radiological Exams on Admission: CT Angio Head W or Wo Contrast  Result Date: 10/26/2019 CLINICAL DATA:  Slurred speech and right-sided  weakness EXAM: CT ANGIOGRAPHY HEAD AND NECK CT PERFUSION BRAIN TECHNIQUE: Multidetector CT imaging of the head and neck was performed using the standard protocol during bolus administration of intravenous contrast. Multiplanar CT image reconstructions and MIPs were obtained to evaluate the vascular anatomy. Carotid stenosis measurements (when applicable) are obtained utilizing NASCET criteria, using the distal internal carotid diameter as the denominator. Multiphase CT imaging of the brain was performed following IV bolus contrast injection. Subsequent parametric perfusion maps were calculated using RAPID software. CONTRAST:  180mL OMNIPAQUE IOHEXOL 350 MG/ML SOLN COMPARISON:  None. FINDINGS: CT HEAD FINDINGS Brain: There is no mass, hemorrhage or extra-axial collection. The size and configuration of the ventricles and extra-axial CSF spaces are normal. No acute cortical infarct. There is hypoattenuation of the periventricular white matter, most commonly indicating chronic ischemic microangiopathy. Skull: The visualized skull base, calvarium and extracranial soft tissues are normal. Sinuses/Orbits: No fluid levels or advanced mucosal thickening of the visualized paranasal sinuses. No mastoid or middle ear effusion. The orbits are normal. CTA NECK FINDINGS SKELETON: Grade 1 anterolisthesis at C4-5. OTHER NECK: Normal pharynx, larynx and major salivary glands. No cervical lymphadenopathy. Unremarkable thyroid gland. UPPER CHEST: No pneumothorax or pleural effusion. No nodules or masses. AORTIC ARCH: There is mild calcific atherosclerosis of the aortic arch. There is no aneurysm, dissection or hemodynamically significant stenosis of the visualized portion of the aorta. Conventional 3 vessel aortic branching pattern. The visualized proximal subclavian arteries are widely patent. RIGHT CAROTID SYSTEM: No dissection, occlusion or aneurysm. Mild atherosclerotic calcification at the carotid bifurcation without  hemodynamically significant stenosis. LEFT CAROTID SYSTEM: No dissection, occlusion or aneurysm. Mild atherosclerotic calcification at the carotid bifurcation without hemodynamically significant stenosis. VERTEBRAL ARTERIES: Right dominant configuration. The left vertebral artery is non-opacified at multiple locations of the V1 segment and proximal V2 segment. The distal V4 segment is also poorly opacified, but this might be partially due to poor contrast bolus. There is multifocal mild atherosclerosis of the right vertebral artery. CTA HEAD FINDINGS POSTERIOR CIRCULATION: --Vertebral arteries: Non opacification of the distal V4 segment of the left vertebral artery. Moderate atherosclerotic narrowing of the right  V4 segment. --Posterior inferior cerebellar arteries (PICA): Patent origins from the vertebral arteries. --Anterior inferior cerebellar arteries (AICA): Patent origins from the basilar artery. --Basilar artery: Normal. --Superior cerebellar arteries: Normal. --Posterior cerebral arteries: Normal. Both originate from the basilar artery. Posterior communicating arteries (p-comm) are diminutive or absent. ANTERIOR CIRCULATION: --Intracranial internal carotid arteries: Normal. --Anterior cerebral arteries (ACA): Normal. Both A1 segments are present. Patent anterior communicating artery (a-comm). --Middle cerebral arteries (MCA): Normal. VENOUS SINUSES: As permitted by contrast timing, patent. ANATOMIC VARIANTS: None Review of the MIP images confirms the above findings. CT Brain Perfusion Findings: ASPECTS: 10 CBF (<30%) Volume: 29mL Perfusion (Tmax>6.0s) volume: 54mL Mismatch Volume: 20mL Infarction Location:None Note: The calculated area of ischemia is likely artifactual and appears to correspond to the CSF space in the posterior fossa. IMPRESSION: 1. No emergent large vessel occlusion or high-grade stenosis of the intracranial arteries. 2. Occlusion of the left vertebral artery at multiple locations of the V1  segment and proximal V2 segment, with poor opacification of the distal V4 segment. This is likely secondary to atherosclerotic disease and exaggerated by a relatively poor contrast bolus. 3. No acute infarct by CT perfusion analysis. Small region of calculated penumbra in the left posterior fossa is considered artifactual. 4. Aortic Atherosclerosis (ICD10-I70.0). Electronically Signed   By: Deatra Robinson M.D.   On: 10/26/2019 03:23   CT ANGIO NECK W OR WO CONTRAST  Result Date: 10/26/2019 CLINICAL DATA:  Slurred speech and right-sided weakness EXAM: CT ANGIOGRAPHY HEAD AND NECK CT PERFUSION BRAIN TECHNIQUE: Multidetector CT imaging of the head and neck was performed using the standard protocol during bolus administration of intravenous contrast. Multiplanar CT image reconstructions and MIPs were obtained to evaluate the vascular anatomy. Carotid stenosis measurements (when applicable) are obtained utilizing NASCET criteria, using the distal internal carotid diameter as the denominator. Multiphase CT imaging of the brain was performed following IV bolus contrast injection. Subsequent parametric perfusion maps were calculated using RAPID software. CONTRAST:  OMNIPAQUE IOHEXOL 350 MG/ML SOLN COMPARISON:  None. FINDINGS: CT HEAD FINDINGS Brain: There is no mass, hemorrhage or extra-axial collection. The size and configuration of the ventricles and extra-axial CSF spaces are normal. No acute cortical infarct. There is hypoattenuation of the periventricular white matter, most commonly indicating chronic ischemic microangiopathy. Skull: The visualized skull base, calvarium and extracranial soft tissues are normal. Sinuses/Orbits: No fluid levels or advanced mucosal thickening of the visualized paranasal sinuses. No mastoid or middle ear effusion. The orbits are normal. CTA NECK FINDINGS SKELETON: Grade 1 anterolisthesis at C4-5. OTHER NECK: Normal pharynx, larynx and major salivary glands. No cervical  lymphadenopathy. Unremarkable thyroid gland. UPPER CHEST: No pneumothorax or pleural effusion. No nodules or masses. AORTIC ARCH: There is mild calcific atherosclerosis of the aortic arch. There is no aneurysm, dissection or hemodynamically significant stenosis of the visualized portion of the aorta. Conventional 3 vessel aortic branching pattern. The visualized proximal subclavian arteries are widely patent. RIGHT CAROTID SYSTEM: No dissection, occlusion or aneurysm. Mild atherosclerotic calcification at the carotid bifurcation without hemodynamically significant stenosis. LEFT CAROTID SYSTEM: No dissection, occlusion or aneurysm. Mild atherosclerotic calcification at the carotid bifurcation without hemodynamically significant stenosis. VERTEBRAL ARTERIES: Right dominant configuration. The left vertebral artery is non-opacified at multiple locations of the V1 segment and proximal V2 segment. The distal V4 segment is also poorly opacified, but this might be partially due to poor contrast bolus. There is multifocal mild atherosclerosis of the right vertebral artery. CTA HEAD FINDINGS POSTERIOR CIRCULATION: --Vertebral arteries: Non opacification  of the distal V4 segment of the left vertebral artery. Moderate atherosclerotic narrowing of the right V4 segment. --Posterior inferior cerebellar arteries (PICA): Patent origins from the vertebral arteries. --Anterior inferior cerebellar arteries (AICA): Patent origins from the basilar artery. --Basilar artery: Normal. --Superior cerebellar arteries: Normal. --Posterior cerebral arteries: Normal. Both originate from the basilar artery. Posterior communicating arteries (p-comm) are diminutive or absent. ANTERIOR CIRCULATION: --Intracranial internal carotid arteries: Normal. --Anterior cerebral arteries (ACA): Normal. Both A1 segments are present. Patent anterior communicating artery (a-comm). --Middle cerebral arteries (MCA): Normal. VENOUS SINUSES: As permitted by contrast  timing, patent. ANATOMIC VARIANTS: None Review of the MIP images confirms the above findings. CT Brain Perfusion Findings: ASPECTS: 10 CBF (<30%) Volume: 0mL Perfusion (Tmax>6.0s) volume: 6mL Mismatch Volume: 6mL Infarction Location:None Note: The calculated area of ischemia is likely artifactual and appears to correspond to the CSF space in the posterior fossa. IMPRESSION: 1. No emergent large vessel occlusion or high-grade stenosis of the intracranial arteries. 2. Occlusion of the left vertebral artery at multiple locations of the V1 segment and proximal V2 segment, with poor opacification of the distal V4 segment. This is likely secondary to atherosclerotic disease and exaggerated by a relatively poor contrast bolus. 3. No acute infarct by CT perfusion analysis. Small region of calculated penumbra in the left posterior fossa is considered artifactual. 4. Aortic Atherosclerosis (ICD10-I70.0). Electronically Signed   By: Deatra RobinsonKevin  Herman M.D.   On: 10/26/2019 03:23   CT CEREBRAL PERFUSION W CONTRAST  Result Date: 10/26/2019 CLINICAL DATA:  Slurred speech and right-sided weakness EXAM: CT ANGIOGRAPHY HEAD AND NECK CT PERFUSION BRAIN TECHNIQUE: Multidetector CT imaging of the head and neck was performed using the standard protocol during bolus administration of intravenous contrast. Multiplanar CT image reconstructions and MIPs were obtained to evaluate the vascular anatomy. Carotid stenosis measurements (when applicable) are obtained utilizing NASCET criteria, using the distal internal carotid diameter as the denominator. Multiphase CT imaging of the brain was performed following IV bolus contrast injection. Subsequent parametric perfusion maps were calculated using RAPID software. CONTRAST:  100mL OMNIPAQUE IOHEXOL 350 MG/ML SOLN COMPARISON:  None. FINDINGS: CT HEAD FINDINGS Brain: There is no mass, hemorrhage or extra-axial collection. The size and configuration of the ventricles and extra-axial CSF spaces are  normal. No acute cortical infarct. There is hypoattenuation of the periventricular white matter, most commonly indicating chronic ischemic microangiopathy. Skull: The visualized skull base, calvarium and extracranial soft tissues are normal. Sinuses/Orbits: No fluid levels or advanced mucosal thickening of the visualized paranasal sinuses. No mastoid or middle ear effusion. The orbits are normal. CTA NECK FINDINGS SKELETON: Grade 1 anterolisthesis at C4-5. OTHER NECK: Normal pharynx, larynx and major salivary glands. No cervical lymphadenopathy. Unremarkable thyroid gland. UPPER CHEST: No pneumothorax or pleural effusion. No nodules or masses. AORTIC ARCH: There is mild calcific atherosclerosis of the aortic arch. There is no aneurysm, dissection or hemodynamically significant stenosis of the visualized portion of the aorta. Conventional 3 vessel aortic branching pattern. The visualized proximal subclavian arteries are widely patent. RIGHT CAROTID SYSTEM: No dissection, occlusion or aneurysm. Mild atherosclerotic calcification at the carotid bifurcation without hemodynamically significant stenosis. LEFT CAROTID SYSTEM: No dissection, occlusion or aneurysm. Mild atherosclerotic calcification at the carotid bifurcation without hemodynamically significant stenosis. VERTEBRAL ARTERIES: Right dominant configuration. The left vertebral artery is non-opacified at multiple locations of the V1 segment and proximal V2 segment. The distal V4 segment is also poorly opacified, but this might be partially due to poor contrast bolus. There is multifocal mild atherosclerosis  of the right vertebral artery. CTA HEAD FINDINGS POSTERIOR CIRCULATION: --Vertebral arteries: Non opacification of the distal V4 segment of the left vertebral artery. Moderate atherosclerotic narrowing of the right V4 segment. --Posterior inferior cerebellar arteries (PICA): Patent origins from the vertebral arteries. --Anterior inferior cerebellar arteries  (AICA): Patent origins from the basilar artery. --Basilar artery: Normal. --Superior cerebellar arteries: Normal. --Posterior cerebral arteries: Normal. Both originate from the basilar artery. Posterior communicating arteries (p-comm) are diminutive or absent. ANTERIOR CIRCULATION: --Intracranial internal carotid arteries: Normal. --Anterior cerebral arteries (ACA): Normal. Both A1 segments are present. Patent anterior communicating artery (a-comm). --Middle cerebral arteries (MCA): Normal. VENOUS SINUSES: As permitted by contrast timing, patent. ANATOMIC VARIANTS: None Review of the MIP images confirms the above findings. CT Brain Perfusion Findings: ASPECTS: 10 CBF (<30%) Volume: 65mL Perfusion (Tmax>6.0s) volume: 75mL Mismatch Volume: 63mL Infarction Location:None Note: The calculated area of ischemia is likely artifactual and appears to correspond to the CSF space in the posterior fossa. IMPRESSION: 1. No emergent large vessel occlusion or high-grade stenosis of the intracranial arteries. 2. Occlusion of the left vertebral artery at multiple locations of the V1 segment and proximal V2 segment, with poor opacification of the distal V4 segment. This is likely secondary to atherosclerotic disease and exaggerated by a relatively poor contrast bolus. 3. No acute infarct by CT perfusion analysis. Small region of calculated penumbra in the left posterior fossa is considered artifactual. 4. Aortic Atherosclerosis (ICD10-I70.0). Electronically Signed   By: Deatra Robinson M.D.   On: 10/26/2019 03:23   CT HEAD CODE STROKE WO CONTRAST`  Result Date: 10/26/2019 CLINICAL DATA:  Code stroke.  Slurred speech and right side weakness EXAM: CT HEAD WITHOUT CONTRAST TECHNIQUE: Contiguous axial images were obtained from the base of the skull through the vertex without intravenous contrast. COMPARISON:  None. FINDINGS: Brain: There is no mass, hemorrhage or extra-axial collection. The size and configuration of the ventricles and  extra-axial CSF spaces are normal. There is hypoattenuation of the periventricular white matter, most commonly indicating chronic ischemic microangiopathy. Old right cerebellar infarct. Vascular: Atherosclerotic calcification of the vertebral and internal carotid arteries at the skull base. No abnormal hyperdensity of the major intracranial arteries or dural venous sinuses. Skull: The visualized skull base, calvarium and extracranial soft tissues are normal. Sinuses/Orbits: No fluid levels or advanced mucosal thickening of the visualized paranasal sinuses. No mastoid or middle ear effusion. The orbits are normal. ASPECTS [facility] Stroke Program Early CT Score) - Ganglionic level infarction (caudate, lentiform nuclei, internal capsule, insula, M1-M3 cortex): 7 - Supraganglionic infarction (M4-M6 cortex): 3 Total score (0-10 with 10 being normal): 10 IMPRESSION: 1. No acute intracranial abnormality. 2. ASPECTS is 10. 3. 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 * These results were called by telephone at the time of interpretation on 10/26/2019 at 2:14 am to provider [facility] , who verbally acknowledged these results. Electronically Signed   By: Deatra Robinson M.D.   On: 10/26/2019 02:14    EKG: Independently reviewed.   Assessment/Plan Principal Problem:   Acute CVA (cerebrovascular accident) [facility]) -Patient presented with reports of slurred speech and right-sided weakness that was already resolving by arrival.  He arrived outside TPA window -CT with no acute intracranial findings, CTA head and neck with no LVO and no acute infarct by CT perfusion analysis -Stroke work-up to include echocardiogram and continuous cardiac monitoring and MRI -continue home  aspirin and add Plavix for 30 day overlap pending further neurology recommendations: -Lipitor -Allow permissive hypertension for  24 to 48 hours -PT OT and speech consult -N.p.o. until cleared with swallow study    Chronic  back pain -Continue home meds once cleared to swallow    Type 2 diabetes mellitus with neuropathy (HCC) -Insulin sliding scale coverage    Essential hypertension -Hold home antihypertensives to allow for permissive hypertension   Stroke (cerebrum) (HCC)    DVT prophylaxis: Lovenox  Code Status: full code  Family Communication:  none  Disposition Plan: Back to previous home environment Consults called: neurology  Status:obs    Andris Baumann MD [facility]     10/26/2019, 4:37 AM

## 2019-10-26 NOTE — Progress Notes (Signed)
Pt NIHSS up 2 points from am assessment (which was 5 and is now 7) due to drift noted in right arm on this assessment and need to repeat commands for patient. MD who reported to bedside, was notified of change. MD also notified of increased edema in left leg which is chronic for patient for many years.

## 2019-10-26 NOTE — ED Provider Notes (Signed)
[facility] Emergency Department Provider Note   ____________________________________________   I have reviewed the triage vital signs and the nursing notes.   HISTORY  Chief Complaint Code Stroke   History limited by: Not Limited   HPI Jordan Chang is a 79 y.o. male who presents to the emergency department today because of concerns for right sided weakness, facial asymmetry.  Patient's symptoms started this afternoon while he was watching her CAT scan.  Wife states that his first symptom he complained about was some blurry vision.  When he then went to try to get up he could barely put any weight on his right leg.  Wife is concerned that he might fall.  She did notice some slurred speech.  She states she has noticed that he has had some slurred speech on and off for the past few weeks.  The patient has not had any weakness until today.  At the time my exam he states that his symptoms have improved.   Records reviewed. Per medical record review patient has a history of HLD, HTN.   Past Medical History:  Diagnosis Date  . GERD (gastroesophageal reflux disease)    occ  . Hyperlipemia   . Hypertension     Patient Active Problem List   Diagnosis Date Noted  . Acute CVA (cerebrovascular accident) ([place]) 10/26/2019  . Type 2 diabetes mellitus with other specified complication ([place]) 33/29/5188  . Essential hypertension 10/26/2019  . Stroke (cerebrum) ([place]) 10/26/2019  . Chronic back pain 01/22/2016  . Lumbar herniated disc 01/20/2016    Past Surgical History:  Procedure Laterality Date  . BACK SURGERY  05/2011  . EYE SURGERY     cataracts  . LUMBAR LAMINECTOMY/DECOMPRESSION MICRODISCECTOMY Left 01/20/2016   Procedure: Left Lumbar two-three, Lumbar three-four,  Lumbar four-five Microdiskectomy;  Surgeon: Newman Pies, MD;  Location: [facility];  Service: Neurosurgery;  Laterality: Left;    Prior to Admission medications   Medication Sig Start  Date End Date Taking? Authorizing Provider  aspirin EC 81 MG tablet Take 81 mg by mouth every morning.     [provider]  atorvastatin (LIPITOR) 10 MG tablet Take 10 mg by mouth every other day. 11/24/15 11/23/16  [provider]  Cholecalciferol (D3-1000 PO) Take 1,000 mcg by mouth daily.     [provider]  Cholecalciferol (VITAMIN D-1000 MAX ST) 1000 units tablet Take 2,000 Units by mouth daily.    [provider]  cyclobenzaprine (FLEXERIL) 10 MG tablet Take 1 tablet (10 mg total) by mouth 3 (three) times daily as needed for muscle spasms. 01/21/16   Newman Pies, MD  docusate sodium (COLACE) 100 MG capsule Take 1 capsule (100 mg total) by mouth 2 (two) times daily. 01/21/16   Newman Pies, MD  gabapentin (NEURONTIN) 300 MG capsule Take 600 mg by mouth 3 (three) times daily.     [provider]  ibuprofen (GOODSENSE IBUPROFEN) 200 MG tablet Take 600 mg by mouth 3 (three) times daily.    [provider]  losartan (COZAAR) 50 MG tablet Take 50 mg by mouth daily.    [provider]  oxyCODONE-acetaminophen (PERCOCET/ROXICET) 5-325 MG tablet Take 1-2 tablets by mouth every 4 (four) hours as needed for moderate pain. 01/23/16   Henreitta Leber, MD  PEPCID 20 MG tablet Take 20 mg by mouth daily as needed for heartburn or indigestion.  10/12/15   [provider]  traMADol (ULTRAM) 50 MG tablet Take 1  tablet (50 mg total) by mouth every 6 (six) hours as needed. Patient not taking: Reported on 02/14/2016 01/23/16   Houston Siren, MD  triamterene-hydrochlorothiazide (MAXZIDE-25) 37.5-25 MG tablet Take 1 tablet by mouth daily.    [provider]  vitamin B-12 (CYANOCOBALAMIN) 1000 MCG tablet Take 1,000 mcg by mouth daily.    [provider]    Allergies Other  No family history on file.  Social History Social History   Tobacco Use  . Smoking status: Never Smoker  . Smokeless tobacco: Never Used   Substance Use Topics  . Alcohol use: Not on file  . Drug use: No    Review of Systems Constitutional: No fever/chills Eyes: No visual changes. ENT: No sore throat. Cardiovascular: Denies chest pain. Respiratory: Denies shortness of breath. Gastrointestinal: No abdominal pain.  No nausea, no vomiting.  No diarrhea.   Genitourinary: Negative for dysuria. Musculoskeletal: Negative for back pain. Skin: Negative for rash. Neurological: Positive for right sided weakness, slurred speech. ____________________________________________   PHYSICAL EXAM:  VITAL SIGNS: ED Triage Vitals [10/26/19 0215]  Enc Vitals Group     BP (!) 159/69     Pulse Rate 98     Resp (!) 24     Temp 98 F (36.7 C)     Temp Source Axillary     SpO2 94 %     Weight 275 lb (124.7 kg)     Height 5\' 11"  (1.803 m)     Head Circumference      Peak Flow      Pain Score 0   Constitutional: Alert and oriented.  Eyes: Conjunctivae are normal.  ENT      Head: Normocephalic and atraumatic.      Nose: No congestion/rhinnorhea.      Mouth/Throat: Mucous membranes are moist.      Neck: No stridor. Hematological/Lymphatic/Immunilogical: No cervical lymphadenopathy. Cardiovascular: Normal rate, regular rhythm.  No murmurs, rubs, or gallops. Respiratory: Normal respiratory effort without tachypnea nor retractions. Breath sounds are clear and equal bilaterally. No wheezes/rales/rhonchi. Gastrointestinal: Soft and non tender. No rebound. No guarding.  Genitourinary: Deferred Musculoskeletal: Normal range of motion in all extremities.  Neurologic:  Question slightly slurred speech. EOMI. PERRL. Strength 4/5 in upper and lower right extremity. Sensation intact. No facial asymetry appreciated. Skin:  Skin is warm, dry and intact. No rash noted. Psychiatric: Mood and affect are normal. Speech and behavior are normal. Patient exhibits appropriate insight and judgment.  ____________________________________________     LABS (pertinent positives/negatives)  UA clear, 0-5 rbc and wbc CBC wbc 5.6, hgb 14.8, plt 143 CMP wnl except glu 136, BUN 27, AST 51  ____________________________________________   EKG  I, , attending physician, personally viewed and interpreted this EKG  EKG Time: 0210 Rate: 96 Rhythm: sinus rhythm Axis: normal Intervals: qtc 503 QRS: nonspecific IVCD ST changes: no st elevation Impression: abnormal ekg  ____________________________________________    RADIOLOGY  CT head No acute infarct  CT angio head/neck/perfusion No large vessel occlusion. No evidence of acute infarct  ____________________________________________   PROCEDURES  Procedures  ____________________________________________   INITIAL IMPRESSION / ASSESSMENT AND PLAN / ED COURSE  Pertinent labs & imaging results that were available during my care of the patient were reviewed by me and considered in my medical decision making (see chart for details).   She presented to the emergency department today because of signs and symptoms concerning for acute stroke.  Teleneurologist were initially contacted however they stated he was  not a code stroke because he was outside of the timeframe for TPA.  CT angio head and neck as well as perfusion were obtained which not show any large vessel occlusion.  Patient symptoms had improved by the time my exam.  Will plan on admission for further stroke/TIA work-up. Discussed findings and plan with patient and family.  ____________________________________________   FINAL CLINICAL IMPRESSION(S) / ED DIAGNOSES  Final diagnoses:  Right sided weakness     Note: This dictation was prepared with Dragon dictation. Any transcriptional errors that result from this process are unintentional     Phineas Semen, MD 10/26/19 [phone]

## 2019-10-26 NOTE — Progress Notes (Signed)
PROGRESS NOTE    Jordan Chang  ZHY:[id number] DOB: Aug 20, 1940 DOA: 10/26/2019 PCP: Barbette Reichmann, MD   Chief Complaint  Patient presents with  . Code Stroke    Brief Narrative:  79 year old obese male with history of hypertension, diabetes mellitus, chronic back pain and neuropathy presented with acute onset of slurred speech with right arm and leg weakness with some facial drooping.  Onset of symptoms was on the evening prior to admission. Patient was out of the therapeutic window for TPA.  Blood pressure was mildly elevated.  head CT negative for acute findings.  CT angiogram of the head and neck also did not show any significant stenosis. Patient admitted for acute stroke.   Assessment & Plan:   Principal Problem:   Acute CVA (cerebrovascular accident) (HCC) MRI brain showing triple small acute to subacute infarct involving bilateral brain parenchyma (left pons, inferior right cerebellum, left frontal lobe and right parietal lobe).  Also shows chronic microvascular ischemic changes and small chronic right cerebellar infarct. Suspect embolic stroke based on findings.  2D echo pending.  LDL of 130.  A1c of 6.6.  Continue on Lipitor 80 mg daily.  Patient was already on baby aspirin and Plavix was added. Right-sided weakness and speech impairment improved on exam this morning.  Passed bedside swallow. PT and OT evaluation, SLP evaluation. Allow permissive blood pressure.  Counseled on lifestyle modification including diet and exercise to lose weight, medication adherence, tight blood pressure and blood glucose control, lipid-lowering and other secondary stroke prevention.  Follow-up with further neurology recommendations.  Active Problems:    Type 2 diabetes mellitus with other specified complication (HCC) A1c of 6.6.  Monitor on sliding scale coverage.  Metformin at home.    Essential hypertension Allow permissive blood pressure for now.  Needs tight blood pressure control  as outpatient.  Obesity (BMI 39.5 kg/m) Counseled on weight loss and exercise.    DVT prophylaxis: Subcu Lovenox Code Status: Full code Family Communication: Wife at bedside Disposition:   Status is: Inpatient  The patient will require care spanning > 2 midnights and should be moved to inpatient because: Ongoing diagnostic testing needed not appropriate for outpatient work up.  Follow-up on 2D echo, further neurology recommendations for inpatient work-up of stroke, PT, OT and SLP evaluation.  Unsafe discharge due to acute stroke.  Dispo: The patient is from: Home              Anticipated d/c is to: To be determined              Anticipated d/c date is: 1 day              Patient currently is not medically stable to d/c.       Consultants:   Neurology  Procedures: CT head, CT angio head and neck, MRI brain, 2D echo    Antimicrobials: None  Subjective: Seen and examined.  Reports he is slurred speech is better this morning.  Also reports improved strength in his right arm and leg.  Objective: Vitals:   10/26/19 0559 10/26/19 0619 10/26/19 0741 10/26/19 1042  BP:  (!) 142/75 (!) 156/82 (!) 175/76  Pulse:  94 96   Resp:  20 18 (!) 21  Temp:  98.1 F (36.7 C) 98.5 F (36.9 C)   TempSrc:  Oral Oral   SpO2:  95% 97%   Weight: 128.5 kg     Height:  (1.803 m)       Intake/Output  Summary (Last 24 hours) at 10/26/2019 1115 Last data filed at 10/26/2019 0800 Gross per 24 hour  Intake --  Output 325 ml  Net -325 ml   Filed Weights   10/26/19 0215 10/26/19 0559  Weight: 124.7 kg 128.5 kg    Examination:  General: Elderly male not in distress, hard of hearing HEENT: Moist mucosa, supple neck Chest: Clear bilaterally CVs: Normal S1-S2, no murmurs GI: Soft, nondistended, nontender Musculoskeletal: Warm, trace pitting edema over the left leg (reports to be chronic) CNS: Alert and oriented x3, normal tone in the extremities, no facial droop, 4/5 power over the  right upper and lower leg, normal sensation, plantars downgoing.  Gait not assessed   Data Reviewed: I have personally reviewed following labs and imaging studies  CBC: Recent Labs  Lab 10/26/19 0214  WBC 5.6  NEUTROABS 3.5  HGB 14.8  HCT 43.2  MCV 92.5  PLT 143*    Basic Metabolic Panel: Recent Labs  Lab 10/26/19 0214  NA 137  K 4.2  CL 104  CO2 23  GLUCOSE 136*  BUN 27*  CREATININE 1.20  CALCIUM 9.5    GFR: Estimated Creatinine Clearance: 67.1 mL/min (by C-G formula based on SCr of 1.2 mg/dL).  Liver Function Tests: Recent Labs  Lab 10/26/19 0214  AST 51*  ALT 37  ALKPHOS 90  BILITOT 0.9  PROT 7.6  ALBUMIN 4.0    CBG: Recent Labs  Lab 10/26/19 0738  GLUCAP 124*     Recent Results (from the past 240 hour(s))  Respiratory Panel by RT PCR (Flu A&B, Covid) - Nasopharyngeal Swab     Status: None   Collection Time: 10/26/19  4:50 AM   Specimen: Nasopharyngeal Swab  Result Value Ref Range Status   SARS Coronavirus 2 by RT PCR NEGATIVE NEGATIVE Final    Comment: (NOTE) SARS-CoV-2 target nucleic acids are NOT DETECTED. The SARS-CoV-2 RNA is generally detectable in upper respiratoy specimens during the acute phase of infection. The lowest concentration of SARS-CoV-2 viral copies this assay can detect is 131 copies/mL. A negative result does not preclude SARS-Cov-2 infection and should not be used as the sole basis for treatment or other patient management decisions. A negative result may occur with  improper specimen collection/handling, submission of specimen other than nasopharyngeal swab, presence of viral mutation(s) within the areas targeted by this assay, and inadequate number of viral copies (<131 copies/mL). A negative result must be combined with clinical observations, patient history, and epidemiological information. The expected result is Negative. Fact Sheet for Patients:  https://www.moore.com/https://www.fda.gov/media/142436/download Fact Sheet for  Healthcare Providers:  https://www.young.biz/https://www.fda.gov/media/142435/download This test is not yet ap proved or cleared by the [place] FDA and  has been authorized for detection and/or diagnosis of SARS-CoV-2 by FDA under an Emergency Use Authorization (EUA). This EUA will remain  in effect (meaning this test can be used) for the duration of the COVID-19 declaration under Section 564(b)(1) of the Act, 21 U.S.C. section 360bbb-3(b)(1), unless the authorization is terminated or revoked sooner.    Influenza A by PCR NEGATIVE NEGATIVE Final   Influenza B by PCR NEGATIVE NEGATIVE Final    Comment: (NOTE) The Xpert Xpress SARS-CoV-2/FLU/RSV assay is intended as an aid in  the diagnosis of influenza from Nasopharyngeal swab specimens and  should not be used as a sole basis for treatment. Nasal washings and  aspirates are unacceptable for Xpert Xpress SARS-CoV-2/FLU/RSV  testing. Fact Sheet for Patients: https://www.moore.com/https://www.fda.gov/media/142436/download Fact Sheet for Healthcare Providers: https://www.young.biz/https://www.fda.gov/media/142435/download This test  is not yet approved or cleared by the [place] and  has been authorized for detection and/or diagnosis of SARS-CoV-2 by  FDA under an Emergency Use Authorization (EUA). This EUA will remain  in effect (meaning this test can be used) for the duration of the  Covid-19 declaration under Section 564(b)(1) of the Act, 21  U.S.C. section 360bbb-3(b)(1), unless the authorization is  terminated or revoked. Performed at [facility], [address] [place], [place] [address]          Radiology Studies: CT Angio Head W or Wo Contrast  Result Date: 10/26/2019 CLINICAL DATA:  Slurred speech and right-sided weakness EXAM: CT ANGIOGRAPHY HEAD AND NECK CT PERFUSION BRAIN TECHNIQUE: Multidetector CT imaging of the head and neck was performed using the standard protocol during bolus administration of intravenous contrast. Multiplanar CT image reconstructions  and MIPs were obtained to evaluate the vascular anatomy. Carotid stenosis measurements (when applicable) are obtained utilizing NASCET criteria, using the distal internal carotid diameter as the denominator. Multiphase CT imaging of the brain was performed following IV bolus contrast injection. Subsequent parametric perfusion maps were calculated using RAPID software. CONTRAST:  OMNIPAQUE IOHEXOL 350 MG/ML SOLN COMPARISON:  None. FINDINGS: CT HEAD FINDINGS Brain: There is no mass, hemorrhage or extra-axial collection. The size and configuration of the ventricles and extra-axial CSF spaces are normal. No acute cortical infarct. There is hypoattenuation of the periventricular white matter, most commonly indicating chronic ischemic microangiopathy. Skull: The visualized skull base, calvarium and extracranial soft tissues are normal. Sinuses/Orbits: No fluid levels or advanced mucosal thickening of the visualized paranasal sinuses. No mastoid or middle ear effusion. The orbits are normal. CTA NECK FINDINGS SKELETON: Grade 1 anterolisthesis at C4-5. OTHER NECK: Normal pharynx, larynx and major salivary glands. No cervical lymphadenopathy. Unremarkable thyroid gland. UPPER CHEST: No pneumothorax or pleural effusion. No nodules or masses. AORTIC ARCH: There is mild calcific atherosclerosis of the aortic arch. There is no aneurysm, dissection or hemodynamically significant stenosis of the visualized portion of the aorta. Conventional 3 vessel aortic branching pattern. The visualized proximal subclavian arteries are widely patent. RIGHT CAROTID SYSTEM: No dissection, occlusion or aneurysm. Mild atherosclerotic calcification at the carotid bifurcation without hemodynamically significant stenosis. LEFT CAROTID SYSTEM: No dissection, occlusion or aneurysm. Mild atherosclerotic calcification at the carotid bifurcation without hemodynamically significant stenosis. VERTEBRAL ARTERIES: Right dominant configuration. The left  vertebral artery is non-opacified at multiple locations of the V1 segment and proximal V2 segment. The distal V4 segment is also poorly opacified, but this might be partially due to poor contrast bolus. There is multifocal mild atherosclerosis of the right vertebral artery. CTA HEAD FINDINGS POSTERIOR CIRCULATION: --Vertebral arteries: Non opacification of the distal V4 segment of the left vertebral artery. Moderate atherosclerotic narrowing of the right V4 segment. --Posterior inferior cerebellar arteries (PICA): Patent origins from the vertebral arteries. --Anterior inferior cerebellar arteries (AICA): Patent origins from the basilar artery. --Basilar artery: Normal. --Superior cerebellar arteries: Normal. --Posterior cerebral arteries: Normal. Both originate from the basilar artery. Posterior communicating arteries (p-comm) are diminutive or absent. ANTERIOR CIRCULATION: --Intracranial internal carotid arteries: Normal. --Anterior cerebral arteries (ACA): Normal. Both A1 segments are present. Patent anterior communicating artery (a-comm). --Middle cerebral arteries (MCA): Normal. VENOUS SINUSES: As permitted by contrast timing, patent. ANATOMIC VARIANTS: None Review of the MIP images confirms the above findings. CT Brain Perfusion Findings: ASPECTS: 10 CBF (<30%) Volume: 67mL Perfusion (Tmax>6.0s) volume: 25mL Mismatch Volume: 6mL Infarction Location:None Note: The calculated area of ischemia is  likely artifactual and appears to correspond to the CSF space in the posterior fossa. IMPRESSION: 1. No emergent large vessel occlusion or high-grade stenosis of the intracranial arteries. 2. Occlusion of the left vertebral artery at multiple locations of the V1 segment and proximal V2 segment, with poor opacification of the distal V4 segment. This is likely secondary to atherosclerotic disease and exaggerated by a relatively poor contrast bolus. 3. No acute infarct by CT perfusion analysis. Small region of calculated  penumbra in the left posterior fossa is considered artifactual. 4. Aortic Atherosclerosis (ICD10-I70.0). Electronically Signed   By: Deatra Robinson M.D.   On: 10/26/2019 03:23   CT ANGIO NECK W OR WO CONTRAST  Result Date: 10/26/2019 CLINICAL DATA:  Slurred speech and right-sided weakness EXAM: CT ANGIOGRAPHY HEAD AND NECK CT PERFUSION BRAIN TECHNIQUE: Multidetector CT imaging of the head and neck was performed using the standard protocol during bolus administration of intravenous contrast. Multiplanar CT image reconstructions and MIPs were obtained to evaluate the vascular anatomy. Carotid stenosis measurements (when applicable) are obtained utilizing NASCET criteria, using the distal internal carotid diameter as the denominator. Multiphase CT imaging of the brain was performed following IV bolus contrast injection. Subsequent parametric perfusion maps were calculated using RAPID software. CONTRAST:  OMNIPAQUE IOHEXOL 350 MG/ML SOLN COMPARISON:  None. FINDINGS: CT HEAD FINDINGS Brain: There is no mass, hemorrhage or extra-axial collection. The size and configuration of the ventricles and extra-axial CSF spaces are normal. No acute cortical infarct. There is hypoattenuation of the periventricular white matter, most commonly indicating chronic ischemic microangiopathy. Skull: The visualized skull base, calvarium and extracranial soft tissues are normal. Sinuses/Orbits: No fluid levels or advanced mucosal thickening of the visualized paranasal sinuses. No mastoid or middle ear effusion. The orbits are normal. CTA NECK FINDINGS SKELETON: Grade 1 anterolisthesis at C4-5. OTHER NECK: Normal pharynx, larynx and major salivary glands. No cervical lymphadenopathy. Unremarkable thyroid gland. UPPER CHEST: No pneumothorax or pleural effusion. No nodules or masses. AORTIC ARCH: There is mild calcific atherosclerosis of the aortic arch. There is no aneurysm, dissection or hemodynamically significant stenosis of the  visualized portion of the aorta. Conventional 3 vessel aortic branching pattern. The visualized proximal subclavian arteries are widely patent. RIGHT CAROTID SYSTEM: No dissection, occlusion or aneurysm. Mild atherosclerotic calcification at the carotid bifurcation without hemodynamically significant stenosis. LEFT CAROTID SYSTEM: No dissection, occlusion or aneurysm. Mild atherosclerotic calcification at the carotid bifurcation without hemodynamically significant stenosis. VERTEBRAL ARTERIES: Right dominant configuration. The left vertebral artery is non-opacified at multiple locations of the V1 segment and proximal V2 segment. The distal V4 segment is also poorly opacified, but this might be partially due to poor contrast bolus. There is multifocal mild atherosclerosis of the right vertebral artery. CTA HEAD FINDINGS POSTERIOR CIRCULATION: --Vertebral arteries: Non opacification of the distal V4 segment of the left vertebral artery. Moderate atherosclerotic narrowing of the right V4 segment. --Posterior inferior cerebellar arteries (PICA): Patent origins from the vertebral arteries. --Anterior inferior cerebellar arteries (AICA): Patent origins from the basilar artery. --Basilar artery: Normal. --Superior cerebellar arteries: Normal. --Posterior cerebral arteries: Normal. Both originate from the basilar artery. Posterior communicating arteries (p-comm) are diminutive or absent. ANTERIOR CIRCULATION: --Intracranial internal carotid arteries: Normal. --Anterior cerebral arteries (ACA): Normal. Both A1 segments are present. Patent anterior communicating artery (a-comm). --Middle cerebral arteries (MCA): Normal. VENOUS SINUSES: As permitted by contrast timing, patent. ANATOMIC VARIANTS: None Review of the MIP images confirms the above findings. CT Brain Perfusion Findings: ASPECTS: 10 CBF (<30%) Volume: 20mL  Perfusion (Tmax>6.0s) volume: 13mL Mismatch Volume: 48mL Infarction Location:None Note: The calculated area of  ischemia is likely artifactual and appears to correspond to the CSF space in the posterior fossa. IMPRESSION: 1. No emergent large vessel occlusion or high-grade stenosis of the intracranial arteries. 2. Occlusion of the left vertebral artery at multiple locations of the V1 segment and proximal V2 segment, with poor opacification of the distal V4 segment. This is likely secondary to atherosclerotic disease and exaggerated by a relatively poor contrast bolus. 3. No acute infarct by CT perfusion analysis. Small region of calculated penumbra in the left posterior fossa is considered artifactual. 4. Aortic Atherosclerosis (ICD10-I70.0). Electronically Signed   By: Ulyses Jarred M.D.   On: 10/26/2019 03:23   MR BRAIN WO CONTRAST  Result Date: 10/26/2019 CLINICAL DATA:  Slurred speech right leg weakness, code stroke follow-up EXAM: MRI HEAD WITHOUT CONTRAST TECHNIQUE: Multiplanar, multiecho pulse sequences of the brain and surrounding structures were obtained without intravenous contrast. COMPARISON:  Correlation made with prior CT imaging FINDINGS: Brain: There is an approximately 11 mm focus of mildly reduced diffusion involving the left aspect of pons. Additional smaller foci of mildly reduced diffusion the inferior right cerebellum (image 8), left frontal lobe (image 39), and right parietal lobe (image 34). There is no evidence of intracranial hemorrhage. There is no intracranial mass, mass effect, or edema. There is no hydrocephalus or extra-axial fluid collection. Patchy foci of T2 hyperintensity in the supratentorial white matter are nonspecific but may reflect mild to moderate chronic microvascular ischemic changes. Small chronic right cerebellar infarcts. Prominence of ventricles and sulci reflects minor generalized parenchymal volume loss. Vascular: Diminished flow void of diminutive left vertebral artery. Major vessel flow voids at the skull base are otherwise preserved. Skull and upper cervical spine:  Normal marrow signal is preserved. Sinuses/Orbits: Minor mucosal thickening. Bilateral lens replacements. Other: Sella is unremarkable.  Mastoid air cells are clear. IMPRESSION: Few small acute to subacute infarcts involving multiple vascular territories. Chronic microvascular ischemic changes. Small chronic right cerebellar infarcts. Electronically Signed   By: Macy Mis M.D.   On: 10/26/2019 07:50   CT CEREBRAL PERFUSION W CONTRAST  Result Date: 10/26/2019 CLINICAL DATA:  Slurred speech and right-sided weakness EXAM: CT ANGIOGRAPHY HEAD AND NECK CT PERFUSION BRAIN TECHNIQUE: Multidetector CT imaging of the head and neck was performed using the standard protocol during bolus administration of intravenous contrast. Multiplanar CT image reconstructions and MIPs were obtained to evaluate the vascular anatomy. Carotid stenosis measurements (when applicable) are obtained utilizing NASCET criteria, using the distal internal carotid diameter as the denominator. Multiphase CT imaging of the brain was performed following IV bolus contrast injection. Subsequent parametric perfusion maps were calculated using RAPID software. CONTRAST:  162mL OMNIPAQUE IOHEXOL 350 MG/ML SOLN COMPARISON:  None. FINDINGS: CT HEAD FINDINGS Brain: There is no mass, hemorrhage or extra-axial collection. The size and configuration of the ventricles and extra-axial CSF spaces are normal. No acute cortical infarct. There is hypoattenuation of the periventricular white matter, most commonly indicating chronic ischemic microangiopathy. Skull: The visualized skull base, calvarium and extracranial soft tissues are normal. Sinuses/Orbits: No fluid levels or advanced mucosal thickening of the visualized paranasal sinuses. No mastoid or middle ear effusion. The orbits are normal. CTA NECK FINDINGS SKELETON: Grade 1 anterolisthesis at C4-5. OTHER NECK: Normal pharynx, larynx and major salivary glands. No cervical lymphadenopathy. Unremarkable thyroid  gland. UPPER CHEST: No pneumothorax or pleural effusion. No nodules or masses. AORTIC ARCH: There is mild calcific atherosclerosis of the  aortic arch. There is no aneurysm, dissection or hemodynamically significant stenosis of the visualized portion of the aorta. Conventional 3 vessel aortic branching pattern. The visualized proximal subclavian arteries are widely patent. RIGHT CAROTID SYSTEM: No dissection, occlusion or aneurysm. Mild atherosclerotic calcification at the carotid bifurcation without hemodynamically significant stenosis. LEFT CAROTID SYSTEM: No dissection, occlusion or aneurysm. Mild atherosclerotic calcification at the carotid bifurcation without hemodynamically significant stenosis. VERTEBRAL ARTERIES: Right dominant configuration. The left vertebral artery is non-opacified at multiple locations of the V1 segment and proximal V2 segment. The distal V4 segment is also poorly opacified, but this might be partially due to poor contrast bolus. There is multifocal mild atherosclerosis of the right vertebral artery. CTA HEAD FINDINGS POSTERIOR CIRCULATION: --Vertebral arteries: Non opacification of the distal V4 segment of the left vertebral artery. Moderate atherosclerotic narrowing of the right V4 segment. --Posterior inferior cerebellar arteries (PICA): Patent origins from the vertebral arteries. --Anterior inferior cerebellar arteries (AICA): Patent origins from the basilar artery. --Basilar artery: Normal. --Superior cerebellar arteries: Normal. --Posterior cerebral arteries: Normal. Both originate from the basilar artery. Posterior communicating arteries (p-comm) are diminutive or absent. ANTERIOR CIRCULATION: --Intracranial internal carotid arteries: Normal. --Anterior cerebral arteries (ACA): Normal. Both A1 segments are present. Patent anterior communicating artery (a-comm). --Middle cerebral arteries (MCA): Normal. VENOUS SINUSES: As permitted by contrast timing, patent. ANATOMIC VARIANTS: None  Review of the MIP images confirms the above findings. CT Brain Perfusion Findings: ASPECTS: 10 CBF (<30%) Volume: 49mL Perfusion (Tmax>6.0s) volume: 70mL Mismatch Volume: 70mL Infarction Location:None Note: The calculated area of ischemia is likely artifactual and appears to correspond to the CSF space in the posterior fossa. IMPRESSION: 1. No emergent large vessel occlusion or high-grade stenosis of the intracranial arteries. 2. Occlusion of the left vertebral artery at multiple locations of the V1 segment and proximal V2 segment, with poor opacification of the distal V4 segment. This is likely secondary to atherosclerotic disease and exaggerated by a relatively poor contrast bolus. 3. No acute infarct by CT perfusion analysis. Small region of calculated penumbra in the left posterior fossa is considered artifactual. 4. Aortic Atherosclerosis (ICD10-I70.0). Electronically Signed   By: Deatra Robinson M.D.   On: 10/26/2019 03:23   CT HEAD CODE STROKE WO CONTRAST`  Result Date: 10/26/2019 CLINICAL DATA:  Code stroke.  Slurred speech and right side weakness EXAM: CT HEAD WITHOUT CONTRAST TECHNIQUE: Contiguous axial images were obtained from the base of the skull through the vertex without intravenous contrast. COMPARISON:  None. FINDINGS: Brain: There is no mass, hemorrhage or extra-axial collection. The size and configuration of the ventricles and extra-axial CSF spaces are normal. There is hypoattenuation of the periventricular white matter, most commonly indicating chronic ischemic microangiopathy. Old right cerebellar infarct. Vascular: Atherosclerotic calcification of the vertebral and internal carotid arteries at the skull base. No abnormal hyperdensity of the major intracranial arteries or dural venous sinuses. Skull: The visualized skull base, calvarium and extracranial soft tissues are normal. Sinuses/Orbits: No fluid levels or advanced mucosal thickening of the visualized paranasal sinuses. No mastoid or  middle ear effusion. The orbits are normal. ASPECTS [facility] Stroke Program Early CT Score) - Ganglionic level infarction (caudate, lentiform nuclei, internal capsule, insula, M1-M3 cortex): 7 - Supraganglionic infarction (M4-M6 cortex): 3 Total score (0-10 with 10 being normal): 10 IMPRESSION: 1. No acute intracranial abnormality. 2. ASPECTS is 10. 3. 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 * These results were called by telephone at the time of interpretation on 10/26/2019 at 2:14 am to  provider Phineas Semen , who verbally acknowledged these results. Electronically Signed   By: Deatra Robinson M.D.   On: 10/26/2019 02:14        Scheduled Meds: .  stroke: mapping our early stages of recovery book   Does not apply Once  . aspirin EC  81 mg Oral Daily  . aspirin  300 mg Rectal Once  . atorvastatin  40 mg Oral QODAY  . clopidogrel  75 mg Oral Daily  . enoxaparin (LOVENOX) injection  40 mg Subcutaneous Q24H  . insulin aspart  0-15 Units Subcutaneous TID WC  . insulin aspart  0-5 Units Subcutaneous QHS   Continuous Infusions: . sodium chloride       LOS: 0 days    Time spent: 35    Shalae Belmonte, MD [facility]   To contact the attending provider between 7A-7P or the covering provider during after hours 7P-7A, please log into the web site [email or web address] and access using universal [place] password for that web site. If you do not have the password, please call the hospital operator.  10/26/2019, 11:15 AM

## 2019-10-26 NOTE — ED Notes (Signed)
Pt readjusted in bed for comfort.  

## 2019-10-26 NOTE — Progress Notes (Signed)
*  PRELIMINARY RESULTS* Echocardiogram 2D Echocardiogram has been performed. Definity IV Contrast used on this study. Garrel Ridgel Rocio Wolak 10/26/2019, 10:05 AM

## 2019-10-26 NOTE — Progress Notes (Signed)
Per PT,  pt should walk with PT only and can transfer with 2 person assist to chair or BSC.

## 2019-10-26 NOTE — ED Notes (Signed)
Attempt to call report to floor, unable to take pt at this time.

## 2019-10-26 NOTE — ED Notes (Signed)
Pt to ct 

## 2019-10-26 NOTE — ED Triage Notes (Addendum)
Pt arrives via ems, last known normal at 1800. Code stroke called by md at bedside. Pt with slurred speech, less grip on right hand and weakness noted to right leg, pt is able to move right leg and arm. . Pt is very hard of hearing. Pt to ct. Scan.

## 2019-10-26 NOTE — Evaluation (Signed)
Occupational Therapy Evaluation Patient Details Name: Jordan Chang MRN: [id number] DOB: 01-26-1941 Today's Date: 10/26/2019    History of Present Illness Jordan Chang is a 79 y.o. male who presents to the emergency department today because of concerns for right sided weakness, facial asymmetry. MRI: Few small acute to subacute infarcts involving multiple vascular   Clinical Impression   Mr Kahrs was seen for OT evaluation this date. Prior to hospital admission, pt was MOD I for mobility using SPC and independent c ADLs including driving. Pt presents to acute OT demonstrating impaired ADL performance and functional mobility 2/2 decreased coordination in R dominant hand, functional strength/ROM/balance deficits, and decreased LB access. Pt currently requires MOD A don/doff R sock seated EOB, CGA don/doff L sock. SETUP unilateral grooming tasks seated EOB. MIN A + elevated surface + RW sit<>stand x3 trials and side steps to R side at EOB. Pt was highly motivated to participate in therapy and return demonstrated RUE THEREX HEP (fist pumps, thumb opposition, over head press). Pt would benefit from skilled OT to address noted impairments and functional limitations (see below for any additional details) in order to maximize safety and independence while minimizing falls risk and caregiver burden. Upon hospital discharge, recommend pt discharge to CIR to maximize safety and return to PLOF.     Follow Up Recommendations  CIR    Equipment Recommendations  TBD at next venue of care    Recommendations for Other Services       Precautions / Restrictions Precautions Precautions: Fall Restrictions Weight Bearing Restrictions: No      Mobility Bed Mobility Overal bed mobility: Needs Assistance Bed Mobility: Supine to Sit;Sit to Supine     Supine to sit: Min assist Sit to supine: Min assist      Transfers Overall transfer level: Needs assistance Equipment used: Rolling walker (2  wheeled) Transfers: Sit to/from Stand Sit to Stand: Min assist;From elevated surface         General transfer comment: sit<>stand x3 at EOB c MIN A and 5 small side steps at EOB to weaker R side c MIN A + RW    Balance Overall balance assessment: Needs assistance Sitting-balance support: Single extremity supported;Feet supported Sitting balance-Leahy Scale: Good     Standing balance support: Bilateral upper extremity supported Standing balance-Leahy Scale: Fair                             ADL either performed or assessed with clinical judgement   ADL Overall ADL's : Needs assistance/impaired                                       General ADL Comments: MOD A don/doff R sock seated EOB, CGA don/doff L sock. SETUP unilateral grooming tasks seated EOB.      Vision         Perception     Praxis      Pertinent Vitals/Pain Pain Assessment: No/denies pain     Hand Dominance Right   Extremity/Trunk Assessment Upper Extremity Assessment Upper Extremity Assessment: RUE deficits/detail(L grip 4+/5, AROM WFL) RUE Deficits / Details: R grip 3+/5. R shoulder AROM ~90*. Intact 5 finger opposition c increased time RUE Sensation: (Pt reports paresthesias have resolved since last night) RUE Coordination: decreased fine motor   Lower Extremity Assessment Lower Extremity Assessment: RLE deficits/detail RLE Deficits /  Details: Clears foot for side steps at EOB, performs SLR at bed level. Decreased strength/ROM       Communication Communication Communication: HOH   Cognition Arousal/Alertness: Awake/alert Behavior During Therapy: WFL for tasks assessed/performed Overall Cognitive Status: Within Functional Limits for tasks assessed                                     General Comments       Exercises Exercises: Other exercises Other Exercises Other Exercises: Pt and caregiver educated re: falls prevention, DME recs, d/c recs,  energy conservation, RUE HEP, neuro re-ed Other Exercises: Bed mobility, sup<>sit, sit<>stand x3, side steps, RUE therex (fist pumps, overhead press, thumb opposition)   Shoulder Instructions      Home Living Family/patient expects to be discharged to:: Private residence Living Arrangements: Spouse/significant other Available Help at Discharge: Family;Available 24 hours/day Type of Home: House Home Access: Stairs to enter [employer] of Steps: 3 Entrance Stairs-Rails: Can reach both;Left;Right Home Layout: One level     Bathroom Shower/Tub: Producer, television/film/video: Standard     Home Equipment: Cane - single point          Prior Functioning/Environment Level of Independence: Independent with assistive device(s)        Comments: Pt used SPC for mobility, Independent c IADLs including driving        OT Problem List: Decreased strength;Decreased range of motion;Decreased activity tolerance;Impaired balance (sitting and/or standing);Decreased coordination;Decreased safety awareness;Decreased knowledge of use of DME or AE      OT Treatment/Interventions: Self-care/ADL training;Therapeutic exercise;Neuromuscular education;Energy conservation;DME and/or AE instruction;Therapeutic activities;Patient/family education;Balance training    OT Goals(Current goals can be found in the care plan section) Acute Rehab OT Goals Patient Stated Goal: To get stronger OT Goal Formulation: With patient/family Time For Goal Achievement: 11/09/19 Potential to Achieve Goals: Good ADL Goals Pt Will Perform Grooming: with set-up;sitting Pt Will Perform Lower Body Dressing: with set-up;sit to/from stand Pt Will Transfer to Toilet: with min guard assist;stand pivot transfer;bedside commode(c LRAD PRN) Pt Will Perform Toileting - Clothing Manipulation and hygiene: with supervision;sit to/from stand Pt/caregiver will Perform Home Exercise Program: Increased ROM;Increased  strength;Right Upper extremity;Independently  OT Frequency: Min 2X/week   Barriers to D/C: Inaccessible home environment          Co-evaluation              AM-PAC OT "6 Clicks" Daily Activity     Outcome Measure Help from another person eating meals?: A Little Help from another person taking care of personal grooming?: A Little Help from another person toileting, which includes using toliet, bedpan, or urinal?: A Little Help from another person bathing (including washing, rinsing, drying)?: A Lot Help from another person to put on and taking off regular upper body clothing?: A Little Help from another person to put on and taking off regular lower body clothing?: A Lot 6 Click Score: 16   End of Session Equipment Utilized During Treatment: Rolling walker  Activity Tolerance: Patient tolerated treatment well Patient left: in bed;with call bell/phone within reach;with bed alarm set;with family/visitor present  OT Visit Diagnosis: Unsteadiness on feet (R26.81);Other abnormalities of gait and mobility (R26.89);Hemiplegia and hemiparesis Hemiplegia - Right/Left: Right Hemiplegia - dominant/non-dominant: Dominant Hemiplegia - caused by: Cerebral infarction                Time: 6759-1638 OT Time Calculation (min):  31 min Charges:  OT General Charges $OT Visit: 1 Visit OT Evaluation $OT Eval Moderate Complexity: 1 Mod OT Treatments $Self Care/Home Management : 23-37 mins  Dessie Coma, M.S. OTR/L  10/26/19, 10:37 AM

## 2019-10-26 NOTE — ED Notes (Signed)
Dr Duncan in to see pt 

## 2019-10-26 NOTE — Progress Notes (Signed)
   10/25/19 0220  Clinical Encounter Type  Visited With Patient and family together  Visit Type Initial  Referral From Nurse  Consult/Referral To Chaplain   This was a Code Stroke PG. Upon arrival Ch met with PT and wife. Wife stated they did not need any services from the [facility]

## 2019-10-26 NOTE — Consult Note (Signed)
Reason for Consult: slurred speech and R sided weakness Requesting Physician: Dr. Para March   CC: Slurred speech and  R side weakness   HPI: Jordan Chang is an 79 y.o. male with hx of  hypertension, diabetes, neuropathy, chronic back pain who was brought to the emergency room with  onset slurred speech with weakness on the right leg on standing and weakness right arm as noticed by his wife along with R facial droop.  Symptoms started at 8:30 PM the day prior to arrival. Improved prior to arrival. [facility] no acute abnormalities. CTA no LVO.  MRI with small subacute infarcts in multiple territories.  Took 162mg  of ASA daily.   Past Medical History:  Diagnosis Date  . GERD (gastroesophageal reflux disease)    occ  . Hyperlipemia   . Hypertension     Past Surgical History:  Procedure Laterality Date  . BACK SURGERY  05/2011  . EYE SURGERY     cataracts  . LUMBAR LAMINECTOMY/DECOMPRESSION MICRODISCECTOMY Left 01/20/2016   Procedure: Left Lumbar two-three, Lumbar three-four,  Lumbar four-five Microdiskectomy;  Surgeon: 01/22/2016, MD;  Location: [facility];  Service: Neurosurgery;  Laterality: Left;    No family history on file.  Social History:  reports that he has never smoked. He has never used smokeless tobacco. He reports previous alcohol use. He reports that he does not use drugs.  Allergies  Allergen Reactions  . Other Other (See Comments)    STEROIDS, not recorded, caused tachycardia Pt "does not know name"    Medications: I have reviewed the patient's current medications.  ROS: History obtained from the patient  General ROS: negative for - chills, fatigue, fever, night sweats, weight gain or weight loss Psychological ROS: negative for - behavioral disorder, hallucinations, memory difficulties, mood swings or suicidal ideation Ophthalmic ROS: negative for - blurry vision, double vision, eye pain or loss of vision ENT ROS: negative for - epistaxis, nasal discharge, oral  lesions, sore throat, tinnitus or vertigo Allergy and Immunology ROS: negative for - hives or itchy/watery eyes Hematological and Lymphatic ROS: negative for - bleeding problems, bruising or swollen lymph nodes Endocrine ROS: negative for - galactorrhea, hair pattern changes, polydipsia/polyuria or temperature intolerance Respiratory ROS: negative for - cough, hemoptysis, shortness of breath or wheezing Cardiovascular ROS: negative for - chest pain, dyspnea on exertion, edema or irregular heartbeat Gastrointestinal ROS: negative for - abdominal pain, diarrhea, hematemesis, nausea/vomiting or stool incontinence Genito-Urinary ROS: negative for - dysuria, hematuria, incontinence or urinary frequency/urgency Musculoskeletal ROS: negative for - joint swelling or muscular weakness Neurological ROS: as noted in HPI Dermatological ROS: negative for rash and skin lesion changes  Physical Examination: Blood pressure (!) 156/82, pulse 96, temperature 98.5 F (36.9 C), temperature source Oral, resp. rate 18, height 5\' 11"  (1.803 m), weight 128.5 kg, SpO2 97 %.    Neurological Examination   Mental Status: Alert, oriented, thought content appropriate.  Speech fluent. Hard of hearing.  Cranial Nerves: II: Discs flat bilaterally; Visual fields grossly normal, pupils equal, round, reactive to light and accommodation III,IV, VI: ptosis not present, extra-ocular motions intact bilaterally V,VII: R facial droop  VIII: decreased hearing  IX,X: gag reflex present XI: bilateral shoulder shrug XII: midline tongue extension Motor: Right : Upper extremity   4/5    Left:     Upper extremity   5/5  Lower extremity   4/5     Lower extremity   5/5 Tone and bulk:normal tone throughout; no atrophy noted Sensory:  Pinprick and light touch intact throughout, bilaterally Deep Tendon Reflexes: 1+ and symmetric throughout Plantars: Right: downgoing   Left: downgoing Cerebellar: normal  finger-to-nose    Laboratory Studies:   Basic Metabolic Panel: Recent Labs  Lab 10/26/19 0214  NA 137  K 4.2  CL 104  CO2 23  GLUCOSE 136*  BUN 27*  CREATININE 1.20  CALCIUM 9.5    Liver Function Tests: Recent Labs  Lab 10/26/19 0214  AST 51*  ALT 37  ALKPHOS 90  BILITOT 0.9  PROT 7.6  ALBUMIN 4.0   No results for input(s): LIPASE, AMYLASE in the last 168 hours. No results for input(s): AMMONIA in the last 168 hours.  CBC: Recent Labs  Lab 10/26/19 0214  WBC 5.6  NEUTROABS 3.5  HGB 14.8  HCT 43.2  MCV 92.5  PLT 143*    Cardiac Enzymes: No results for input(s): CKTOTAL, CKMB, CKMBINDEX, TROPONINI in the last 168 hours.  BNP: Invalid input(s): POCBNP  CBG: Recent Labs  Lab 10/26/19 0738  GLUCAP 124*    Microbiology: Results for orders placed or performed during the hospital encounter of 10/26/19  Respiratory Panel by RT PCR (Flu A&B, Covid) - Nasopharyngeal Swab     Status: None   Collection Time: 10/26/19  4:50 AM   Specimen: Nasopharyngeal Swab  Result Value Ref Range Status   SARS Coronavirus 2 by RT PCR NEGATIVE NEGATIVE Final    Comment: (NOTE) SARS-CoV-2 target nucleic acids are NOT DETECTED. The SARS-CoV-2 RNA is generally detectable in upper respiratoy specimens during the acute phase of infection. The lowest concentration of SARS-CoV-2 viral copies this assay can detect is 131 copies/mL. A negative result does not preclude SARS-Cov-2 infection and should not be used as the sole basis for treatment or other patient management decisions. A negative result may occur with  improper specimen collection/handling, submission of specimen other than nasopharyngeal swab, presence of viral mutation(s) within the areas targeted by this assay, and inadequate number of viral copies (<131 copies/mL). A negative result must be combined with clinical observations, patient history, and epidemiological information. The expected result is  Negative. Fact Sheet for Patients:  https://www.moore.com/https://www.fda.gov/media/142436/download Fact Sheet for Healthcare Providers:  https://www.young.biz/https://www.fda.gov/media/142435/download This test is not yet ap proved or cleared by the [place] FDA and  has been authorized for detection and/or diagnosis of SARS-CoV-2 by FDA under an Emergency Use Authorization (EUA). This EUA will remain  in effect (meaning this test can be used) for the duration of the COVID-19 declaration under Section 564(b)(1) of the Act, 21 U.S.C. section 360bbb-3(b)(1), unless the authorization is terminated or revoked sooner.    Influenza A by PCR NEGATIVE NEGATIVE Final   Influenza B by PCR NEGATIVE NEGATIVE Final    Comment: (NOTE) The Xpert Xpress SARS-CoV-2/FLU/RSV assay is intended as an aid in  the diagnosis of influenza from Nasopharyngeal swab specimens and  should not be used as a sole basis for treatment. Nasal washings and  aspirates are unacceptable for Xpert Xpress SARS-CoV-2/FLU/RSV  testing. Fact Sheet for Patients: https://www.moore.com/https://www.fda.gov/media/142436/download Fact Sheet for Healthcare Providers: https://www.young.biz/https://www.fda.gov/media/142435/download This test is not yet approved or cleared by the [place] FDA and  has been authorized for detection and/or diagnosis of SARS-CoV-2 by  FDA under an Emergency Use Authorization (EUA). This EUA will remain  in effect (meaning this test can be used) for the duration of the  Covid-19 declaration under Section 564(b)(1) of the Act, 21  U.S.C. section 360bbb-3(b)(1), unless the authorization is  terminated or  revoked. Performed at [facility], [address] [place], [place] [address]     Coagulation Studies: Recent Labs    10/26/19 0214  LABPROT 14.6  INR 1.2    Urinalysis:  Recent Labs  Lab 10/26/19 0340  COLORURINE YELLOW*  LABSPEC 1.020  PHURINE 7.0  GLUCOSEU NEGATIVE  HGBUR NEGATIVE  BILIRUBINUR NEGATIVE  KETONESUR NEGATIVE  PROTEINUR NEGATIVE   NITRITE NEGATIVE  LEUKOCYTESUR NEGATIVE    Lipid Panel:     Component Value Date/Time   CHOL 205 (H) 10/26/2019 0214   TRIG 80 10/26/2019 0214   HDL 56 10/26/2019 0214   CHOLHDL 3.7 10/26/2019 0214   VLDL 16 10/26/2019 0214   LDLCALC 133 (H) 10/26/2019 0214    HgbA1C:  Lab Results  Component Value Date   HGBA1C 6.6 (H) 10/26/2019    Urine Drug Screen:      Component Value Date/Time   LABOPIA NONE DETECTED 10/26/2019 0340   COCAINSCRNUR NONE DETECTED 10/26/2019 0340   LABBENZ NONE DETECTED 10/26/2019 0340   AMPHETMU NONE DETECTED 10/26/2019 0340   THCU NONE DETECTED 10/26/2019 0340   LABBARB NONE DETECTED 10/26/2019 0340    Alcohol Level:  Recent Labs  Lab 10/26/19 0214  ETH <10    Other results: EKG: normal EKG, normal sinus rhythm, unchanged from previous tracings.  Imaging: CT Angio Head W or Wo Contrast  Result Date: 10/26/2019 CLINICAL DATA:  Slurred speech and right-sided weakness EXAM: CT ANGIOGRAPHY HEAD AND NECK CT PERFUSION BRAIN TECHNIQUE: Multidetector CT imaging of the head and neck was performed using the standard protocol during bolus administration of intravenous contrast. Multiplanar CT image reconstructions and MIPs were obtained to evaluate the vascular anatomy. Carotid stenosis measurements (when applicable) are obtained utilizing NASCET criteria, using the distal internal carotid diameter as the denominator. Multiphase CT imaging of the brain was performed following IV bolus contrast injection. Subsequent parametric perfusion maps were calculated using RAPID software. CONTRAST:  OMNIPAQUE IOHEXOL 350 MG/ML SOLN COMPARISON:  None. FINDINGS: CT HEAD FINDINGS Brain: There is no mass, hemorrhage or extra-axial collection. The size and configuration of the ventricles and extra-axial CSF spaces are normal. No acute cortical infarct. There is hypoattenuation of the periventricular white matter, most commonly indicating chronic ischemic microangiopathy.  Skull: The visualized skull base, calvarium and extracranial soft tissues are normal. Sinuses/Orbits: No fluid levels or advanced mucosal thickening of the visualized paranasal sinuses. No mastoid or middle ear effusion. The orbits are normal. CTA NECK FINDINGS SKELETON: Grade 1 anterolisthesis at C4-5. OTHER NECK: Normal pharynx, larynx and major salivary glands. No cervical lymphadenopathy. Unremarkable thyroid gland. UPPER CHEST: No pneumothorax or pleural effusion. No nodules or masses. AORTIC ARCH: There is mild calcific atherosclerosis of the aortic arch. There is no aneurysm, dissection or hemodynamically significant stenosis of the visualized portion of the aorta. Conventional 3 vessel aortic branching pattern. The visualized proximal subclavian arteries are widely patent. RIGHT CAROTID SYSTEM: No dissection, occlusion or aneurysm. Mild atherosclerotic calcification at the carotid bifurcation without hemodynamically significant stenosis. LEFT CAROTID SYSTEM: No dissection, occlusion or aneurysm. Mild atherosclerotic calcification at the carotid bifurcation without hemodynamically significant stenosis. VERTEBRAL ARTERIES: Right dominant configuration. The left vertebral artery is non-opacified at multiple locations of the V1 segment and proximal V2 segment. The distal V4 segment is also poorly opacified, but this might be partially due to poor contrast bolus. There is multifocal mild atherosclerosis of the right vertebral artery. CTA HEAD FINDINGS POSTERIOR CIRCULATION: --Vertebral arteries: Non opacification of the distal V4  segment of the left vertebral artery. Moderate atherosclerotic narrowing of the right V4 segment. --Posterior inferior cerebellar arteries (PICA): Patent origins from the vertebral arteries. --Anterior inferior cerebellar arteries (AICA): Patent origins from the basilar artery. --Basilar artery: Normal. --Superior cerebellar arteries: Normal. --Posterior cerebral arteries: Normal. Both  originate from the basilar artery. Posterior communicating arteries (p-comm) are diminutive or absent. ANTERIOR CIRCULATION: --Intracranial internal carotid arteries: Normal. --Anterior cerebral arteries (ACA): Normal. Both A1 segments are present. Patent anterior communicating artery (a-comm). --Middle cerebral arteries (MCA): Normal. VENOUS SINUSES: As permitted by contrast timing, patent. ANATOMIC VARIANTS: None Review of the MIP images confirms the above findings. CT Brain Perfusion Findings: ASPECTS: 10 CBF (<30%) Volume: 0mL Perfusion (Tmax>6.0s) volume: 6mL Mismatch Volume: 6mL Infarction Location:None Note: The calculated area of ischemia is likely artifactual and appears to correspond to the CSF space in the posterior fossa. IMPRESSION: 1. No emergent large vessel occlusion or high-grade stenosis of the intracranial arteries. 2. Occlusion of the left vertebral artery at multiple locations of the V1 segment and proximal V2 segment, with poor opacification of the distal V4 segment. This is likely secondary to atherosclerotic disease and exaggerated by a relatively poor contrast bolus. 3. No acute infarct by CT perfusion analysis. Small region of calculated penumbra in the left posterior fossa is considered artifactual. 4. Aortic Atherosclerosis (ICD10-I70.0). Electronically Signed   By: Deatra Robinson M.D.   On: 10/26/2019 03:23   CT ANGIO NECK W OR WO CONTRAST  Result Date: 10/26/2019 CLINICAL DATA:  Slurred speech and right-sided weakness EXAM: CT ANGIOGRAPHY HEAD AND NECK CT PERFUSION BRAIN TECHNIQUE: Multidetector CT imaging of the head and neck was performed using the standard protocol during bolus administration of intravenous contrast. Multiplanar CT image reconstructions and MIPs were obtained to evaluate the vascular anatomy. Carotid stenosis measurements (when applicable) are obtained utilizing NASCET criteria, using the distal internal carotid diameter as the denominator. Multiphase CT imaging of  the brain was performed following IV bolus contrast injection. Subsequent parametric perfusion maps were calculated using RAPID software. CONTRAST:  OMNIPAQUE IOHEXOL 350 MG/ML SOLN COMPARISON:  None. FINDINGS: CT HEAD FINDINGS Brain: There is no mass, hemorrhage or extra-axial collection. The size and configuration of the ventricles and extra-axial CSF spaces are normal. No acute cortical infarct. There is hypoattenuation of the periventricular white matter, most commonly indicating chronic ischemic microangiopathy. Skull: The visualized skull base, calvarium and extracranial soft tissues are normal. Sinuses/Orbits: No fluid levels or advanced mucosal thickening of the visualized paranasal sinuses. No mastoid or middle ear effusion. The orbits are normal. CTA NECK FINDINGS SKELETON: Grade 1 anterolisthesis at C4-5. OTHER NECK: Normal pharynx, larynx and major salivary glands. No cervical lymphadenopathy. Unremarkable thyroid gland. UPPER CHEST: No pneumothorax or pleural effusion. No nodules or masses. AORTIC ARCH: There is mild calcific atherosclerosis of the aortic arch. There is no aneurysm, dissection or hemodynamically significant stenosis of the visualized portion of the aorta. Conventional 3 vessel aortic branching pattern. The visualized proximal subclavian arteries are widely patent. RIGHT CAROTID SYSTEM: No dissection, occlusion or aneurysm. Mild atherosclerotic calcification at the carotid bifurcation without hemodynamically significant stenosis. LEFT CAROTID SYSTEM: No dissection, occlusion or aneurysm. Mild atherosclerotic calcification at the carotid bifurcation without hemodynamically significant stenosis. VERTEBRAL ARTERIES: Right dominant configuration. The left vertebral artery is non-opacified at multiple locations of the V1 segment and proximal V2 segment. The distal V4 segment is also poorly opacified, but this might be partially due to poor contrast bolus. There is multifocal mild  atherosclerosis of  the right vertebral artery. CTA HEAD FINDINGS POSTERIOR CIRCULATION: --Vertebral arteries: Non opacification of the distal V4 segment of the left vertebral artery. Moderate atherosclerotic narrowing of the right V4 segment. --Posterior inferior cerebellar arteries (PICA): Patent origins from the vertebral arteries. --Anterior inferior cerebellar arteries (AICA): Patent origins from the basilar artery. --Basilar artery: Normal. --Superior cerebellar arteries: Normal. --Posterior cerebral arteries: Normal. Both originate from the basilar artery. Posterior communicating arteries (p-comm) are diminutive or absent. ANTERIOR CIRCULATION: --Intracranial internal carotid arteries: Normal. --Anterior cerebral arteries (ACA): Normal. Both A1 segments are present. Patent anterior communicating artery (a-comm). --Middle cerebral arteries (MCA): Normal. VENOUS SINUSES: As permitted by contrast timing, patent. ANATOMIC VARIANTS: None Review of the MIP images confirms the above findings. CT Brain Perfusion Findings: ASPECTS: 10 CBF (<30%) Volume: 13mL Perfusion (Tmax>6.0s) volume: 38mL Mismatch Volume: 12mL Infarction Location:None Note: The calculated area of ischemia is likely artifactual and appears to correspond to the CSF space in the posterior fossa. IMPRESSION: 1. No emergent large vessel occlusion or high-grade stenosis of the intracranial arteries. 2. Occlusion of the left vertebral artery at multiple locations of the V1 segment and proximal V2 segment, with poor opacification of the distal V4 segment. This is likely secondary to atherosclerotic disease and exaggerated by a relatively poor contrast bolus. 3. No acute infarct by CT perfusion analysis. Small region of calculated penumbra in the left posterior fossa is considered artifactual. 4. Aortic Atherosclerosis (ICD10-I70.0). Electronically Signed   By: Ulyses Jarred M.D.   On: 10/26/2019 03:23   MR BRAIN WO CONTRAST  Result Date:  10/26/2019 CLINICAL DATA:  Slurred speech right leg weakness, code stroke follow-up EXAM: MRI HEAD WITHOUT CONTRAST TECHNIQUE: Multiplanar, multiecho pulse sequences of the brain and surrounding structures were obtained without intravenous contrast. COMPARISON:  Correlation made with prior CT imaging FINDINGS: Brain: There is an approximately 11 mm focus of mildly reduced diffusion involving the left aspect of pons. Additional smaller foci of mildly reduced diffusion the inferior right cerebellum (image 8), left frontal lobe (image 39), and right parietal lobe (image 34). There is no evidence of intracranial hemorrhage. There is no intracranial mass, mass effect, or edema. There is no hydrocephalus or extra-axial fluid collection. Patchy foci of T2 hyperintensity in the supratentorial white matter are nonspecific but may reflect mild to moderate chronic microvascular ischemic changes. Small chronic right cerebellar infarcts. Prominence of ventricles and sulci reflects minor generalized parenchymal volume loss. Vascular: Diminished flow void of diminutive left vertebral artery. Major vessel flow voids at the skull base are otherwise preserved. Skull and upper cervical spine: Normal marrow signal is preserved. Sinuses/Orbits: Minor mucosal thickening. Bilateral lens replacements. Other: Sella is unremarkable.  Mastoid air cells are clear. IMPRESSION: Few small acute to subacute infarcts involving multiple vascular territories. Chronic microvascular ischemic changes. Small chronic right cerebellar infarcts. Electronically Signed   By: Macy Mis M.D.   On: 10/26/2019 07:50   CT CEREBRAL PERFUSION W CONTRAST  Result Date: 10/26/2019 CLINICAL DATA:  Slurred speech and right-sided weakness EXAM: CT ANGIOGRAPHY HEAD AND NECK CT PERFUSION BRAIN TECHNIQUE: Multidetector CT imaging of the head and neck was performed using the standard protocol during bolus administration of intravenous contrast. Multiplanar CT image  reconstructions and MIPs were obtained to evaluate the vascular anatomy. Carotid stenosis measurements (when applicable) are obtained utilizing NASCET criteria, using the distal internal carotid diameter as the denominator. Multiphase CT imaging of the brain was performed following IV bolus contrast injection. Subsequent parametric perfusion maps were calculated using RAPID software.  CONTRAST:  OMNIPAQUE IOHEXOL 350 MG/ML SOLN COMPARISON:  None. FINDINGS: CT HEAD FINDINGS Brain: There is no mass, hemorrhage or extra-axial collection. The size and configuration of the ventricles and extra-axial CSF spaces are normal. No acute cortical infarct. There is hypoattenuation of the periventricular white matter, most commonly indicating chronic ischemic microangiopathy. Skull: The visualized skull base, calvarium and extracranial soft tissues are normal. Sinuses/Orbits: No fluid levels or advanced mucosal thickening of the visualized paranasal sinuses. No mastoid or middle ear effusion. The orbits are normal. CTA NECK FINDINGS SKELETON: Grade 1 anterolisthesis at C4-5. OTHER NECK: Normal pharynx, larynx and major salivary glands. No cervical lymphadenopathy. Unremarkable thyroid gland. UPPER CHEST: No pneumothorax or pleural effusion. No nodules or masses. AORTIC ARCH: There is mild calcific atherosclerosis of the aortic arch. There is no aneurysm, dissection or hemodynamically significant stenosis of the visualized portion of the aorta. Conventional 3 vessel aortic branching pattern. The visualized proximal subclavian arteries are widely patent. RIGHT CAROTID SYSTEM: No dissection, occlusion or aneurysm. Mild atherosclerotic calcification at the carotid bifurcation without hemodynamically significant stenosis. LEFT CAROTID SYSTEM: No dissection, occlusion or aneurysm. Mild atherosclerotic calcification at the carotid bifurcation without hemodynamically significant stenosis. VERTEBRAL ARTERIES: Right dominant  configuration. The left vertebral artery is non-opacified at multiple locations of the V1 segment and proximal V2 segment. The distal V4 segment is also poorly opacified, but this might be partially due to poor contrast bolus. There is multifocal mild atherosclerosis of the right vertebral artery. CTA HEAD FINDINGS POSTERIOR CIRCULATION: --Vertebral arteries: Non opacification of the distal V4 segment of the left vertebral artery. Moderate atherosclerotic narrowing of the right V4 segment. --Posterior inferior cerebellar arteries (PICA): Patent origins from the vertebral arteries. --Anterior inferior cerebellar arteries (AICA): Patent origins from the basilar artery. --Basilar artery: Normal. --Superior cerebellar arteries: Normal. --Posterior cerebral arteries: Normal. Both originate from the basilar artery. Posterior communicating arteries (p-comm) are diminutive or absent. ANTERIOR CIRCULATION: --Intracranial internal carotid arteries: Normal. --Anterior cerebral arteries (ACA): Normal. Both A1 segments are present. Patent anterior communicating artery (a-comm). --Middle cerebral arteries (MCA): Normal. VENOUS SINUSES: As permitted by contrast timing, patent. ANATOMIC VARIANTS: None Review of the MIP images confirms the above findings. CT Brain Perfusion Findings: ASPECTS: 10 CBF (<30%) Volume: 53mL Perfusion (Tmax>6.0s) volume: 35mL Mismatch Volume: 72mL Infarction Location:None Note: The calculated area of ischemia is likely artifactual and appears to correspond to the CSF space in the posterior fossa. IMPRESSION: 1. No emergent large vessel occlusion or high-grade stenosis of the intracranial arteries. 2. Occlusion of the left vertebral artery at multiple locations of the V1 segment and proximal V2 segment, with poor opacification of the distal V4 segment. This is likely secondary to atherosclerotic disease and exaggerated by a relatively poor contrast bolus. 3. No acute infarct by CT perfusion analysis. Small  region of calculated penumbra in the left posterior fossa is considered artifactual. 4. Aortic Atherosclerosis (ICD10-I70.0). Electronically Signed   By: Deatra Robinson M.D.   On: 10/26/2019 03:23   CT HEAD CODE STROKE WO CONTRAST`  Result Date: 10/26/2019 CLINICAL DATA:  Code stroke.  Slurred speech and right side weakness EXAM: CT HEAD WITHOUT CONTRAST TECHNIQUE: Contiguous axial images were obtained from the base of the skull through the vertex without intravenous contrast. COMPARISON:  None. FINDINGS: Brain: There is no mass, hemorrhage or extra-axial collection. The size and configuration of the ventricles and extra-axial CSF spaces are normal. There is hypoattenuation of the periventricular white matter, most commonly indicating chronic ischemic microangiopathy. Old right  cerebellar infarct. Vascular: Atherosclerotic calcification of the vertebral and internal carotid arteries at the skull base. No abnormal hyperdensity of the major intracranial arteries or dural venous sinuses. Skull: The visualized skull base, calvarium and extracranial soft tissues are normal. Sinuses/Orbits: No fluid levels or advanced mucosal thickening of the visualized paranasal sinuses. No mastoid or middle ear effusion. The orbits are normal. ASPECTS [facility] Stroke Program Early CT Score) - Ganglionic level infarction (caudate, lentiform nuclei, internal capsule, insula, M1-M3 cortex): 7 - Supraganglionic infarction (M4-M6 cortex): 3 Total score (0-10 with 10 being normal): 10 IMPRESSION: 1. No acute intracranial abnormality. 2. ASPECTS is 10. 3. 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 * These results were called by telephone at the time of interpretation on 10/26/2019 at 2:14 am to provider [facility] , who verbally acknowledged these results. Electronically Signed   By: Deatra Robinson M.D.   On: 10/26/2019 02:14     Assessment/Plan:  79 y.o. male with hx of  hypertension, diabetes, neuropathy,  chronic back pain who was brought to the emergency room with  onset slurred speech with weakness on the right leg on standing and weakness right arm as noticed by his wife along with R facial droop.  Symptoms started at 8:30 PM the day prior to arrival. Improved prior to arrival. [facility] no acute abnormalities. CTA no LVO.  MRI with small subacute infarcts in multiple territories.  Took  of ASA daily.   - MRI as above - on ASA and Plavix now which I agree with.   - on lipitor - appreciate PT assistance - likely will need rehab.  - suspect stroke in setting of small vessel disease in setting of HTN, DM - awaiting 2decho - on d/c would consider holter to make sure no arhythmia to contribute to bilateral strokes if no abnormalities on TTTE 10/26/2019, 9:39 AM

## 2019-10-27 DIAGNOSIS — I10 Essential (primary) hypertension: Secondary | ICD-10-CM

## 2019-10-27 LAB — ECHOCARDIOGRAM COMPLETE
Height: 71 in
Weight: 4532.66 oz

## 2019-10-27 LAB — GLUCOSE, CAPILLARY
Glucose-Capillary: 121 mg/dL — ABNORMAL HIGH (ref 70–99)
Glucose-Capillary: 134 mg/dL — ABNORMAL HIGH (ref 70–99)
Glucose-Capillary: 107 mg/dL — ABNORMAL HIGH (ref 70–99)
Glucose-Capillary: 120 mg/dL — ABNORMAL HIGH (ref 70–99)

## 2019-10-27 MED ORDER — PANTOPRAZOLE SODIUM 40 MG PO TBEC: 40.0000 mg | DELAYED_RELEASE_TABLET | Freq: Every day | ORAL | Status: DC

## 2019-10-27 MED ORDER — NYSTATIN 100000 UNIT/ML MT SUSP: 5.0000 mL | Freq: Four times a day (QID) | OROMUCOSAL | Status: DC

## 2019-10-27 NOTE — Progress Notes (Signed)
Physical Therapy Treatment Patient Details Name: Jordan Chang MRN: [id number] DOB: [birth year] Today's Date: 10/27/2019    History of Present Illness Pt is an 79 y.o. male presenting to hospital 5/2 with slurred speech, R sided weakness, and facial droop; also slight HA and double vision.  Pt admitted with acute CVA (MRI of brain showing few small acute to subacute infarcts involving multiple vascular territories).  PMH includes htn, DM, neuropathy, chronic back pain, h/o back surgery.    PT Comments    Pt sleeping in bed upon PT arrival; family agreeable to therapist waking pt.  Pt pleasant upon waking and appearing eager to participate in therapy.  Pt trying to do as much as he could on his own during session.  Mod assist semi-supine to sitting edge of bed; mod assist x1 plus CGA of 2nd to stand from bed up to RW and CGA to min assist x2 to stand from recliner up to RW; min to mod assist x2 to ambulate 38 feet with RW (pt initally with narrow BOS requiring cueing to increase BOS; mild R knee flexion noted during R LE stance phase; decreased R LE step length noted with increased distance ambulating; pt with R lean during ambulation requiring min to mod assist to maintain upright posture (vc's to shift weight towards L side); and assist to steady towards end of ambulation).  Pt tolerated session well; pt continues to appear appropriate for CIR (inpatient rehab admissions coordinator present during 2nd part of session for ambulation).  Will continue to progress pt with strengthening and progressive functional mobility during hospital stay.   Follow Up Recommendations  CIR     Equipment Recommendations  (TBD at next facility)    Recommendations for Other Services OT consult     Precautions / Restrictions Precautions Precautions: Fall Precaution Comments: Aspiration Restrictions Weight Bearing Restrictions: No    Mobility  Bed Mobility Overal bed mobility: Needs Assistance Bed  Mobility: Supine to Sit     Supine to sit: Mod assist;HOB elevated     General bed mobility comments: pt attempted to perform on own (towards L side of bed) but unable so therapist provided mod assist for trunk semi-supine to sitting edge of bed; vc's for technique  Transfers Overall transfer level: Needs assistance Equipment used: Rolling walker (2 wheeled) Transfers: Sit to/from Stand Sit to Stand: Min guard;Min assist;Mod assist;+2 physical assistance         General transfer comment: pt attempted to stand from bed on his own but unable even with vc's; mod assist x1 plus CGA of 2nd to stand from bed; CGA to min assist x2 to stand from recliner; vc's and assist to place R hand properly on walker  Ambulation/Gait Ambulation/Gait assistance: Min assist;Mod assist;+2 physical assistance Gait Distance (Feet): 38 Feet Assistive device: Rolling walker (2 wheeled)   Gait velocity: decreased   General Gait Details: pt initally with narrow BOS requiring cueing to increase BOS; mild R knee flexion noted during R LE stance phase; decreased R LE step length noted with increased distance ambulating; pt with R lean during ambulation requiring min to mod assist to maintain upright posture (vc's to shift weight towards L side); assist to steady towards end of ambulation   Stairs             Wheelchair Mobility    Modified Rankin (Stroke Patients Only)       Balance Overall balance assessment: Needs assistance Sitting-balance support: No upper extremity supported;Feet supported Sitting balance-Leahy  Scale: Good Sitting balance - Comments: steady sitting reaching within BOS   Standing balance support: Single extremity supported Standing balance-Leahy Scale: Poor Standing balance comment: pt requiring at least L UE support for static standing balance                            Cognition Arousal/Alertness: Awake/alert Behavior During Therapy: WFL for tasks  assessed/performed Overall Cognitive Status: Within Functional Limits for tasks assessed                                 General Comments: Pt joking around during session.      Exercises      General Comments   Nursing cleared pt for participation in physical therapy.  Pt agreeable to PT session.  Pt's wife and daughter present during session.      Pertinent Vitals/Pain Pain Assessment: No/denies pain Faces Pain Scale: No hurt Pain Intervention(s): Limited activity within patient's tolerance;Monitored during session;Repositioned  Vitals (HR and O2 on room air) stable and WFL throughout treatment session.    Home Living     Available Help at Discharge: Family;Available 24 hours/day Type of Home: House              Prior Function            PT Goals (current goals can now be found in the care plan section) Acute Rehab PT Goals Patient Stated Goal: To get stronger PT Goal Formulation: With patient Time For Goal Achievement: 11/09/19 Potential to Achieve Goals: Good Progress towards PT goals: Progressing toward goals    Frequency    7X/week      PT Plan Current plan remains appropriate    Co-evaluation              AM-PAC PT "6 Clicks" Mobility   Outcome Measure  Help needed turning from your back to your side while in a flat bed without using bedrails?: A Little Help needed moving from lying on your back to sitting on the side of a flat bed without using bedrails?: A Lot Help needed moving to and from a bed to a chair (including a wheelchair)?: A Lot Help needed standing up from a chair using your arms (e.g., wheelchair or bedside chair)?: A Lot Help needed to walk in hospital room?: Total Help needed climbing 3-5 steps with a railing? : Total 6 Click Score: 11    End of Session Equipment Utilized During Treatment: Gait belt Activity Tolerance: Patient tolerated treatment well Patient left: in chair;with call bell/phone within  reach;with chair alarm set;with family/visitor present(Inpatient Rehab Admissions Coordinator present) Nurse Communication: Mobility status;Precautions PT Visit Diagnosis: Other abnormalities of gait and mobility (R26.89);Muscle weakness (generalized) (M62.81);Hemiplegia and hemiparesis Hemiplegia - Right/Left: Right Hemiplegia - dominant/non-dominant: Dominant Hemiplegia - caused by: Cerebral infarction     Time: 1829-9371 PT Time Calculation (min) (ACUTE ONLY): 30 min  Charges:  $Gait Training: 8-22 mins $Therapeutic Activity: 8-22 mins                     Leitha Bleak, PT 10/27/19, 3:03 PM

## 2019-10-27 NOTE — Progress Notes (Signed)
PROGRESS NOTE    Jordan Chang  FOY:[id number] DOB: 18-Apr-1941 DOA: 10/26/2019 PCP: Barbette Reichmann, MD   Chief Complaint  Patient presents with  . Code Stroke    Brief Narrative:  79 year old obese male with history of hypertension, diabetes mellitus, chronic back pain and neuropathy presented with acute onset of slurred speech with right arm and leg weakness with some facial drooping.  Onset of symptoms was on the evening prior to admission. Patient was out of the therapeutic window for TPA.  Blood pressure was mildly elevated.  head CT negative for acute findings.  CT angiogram of the head and neck also did not show any significant stenosis. Patient admitted for acute stroke.   Assessment & Plan:   Principal Problem:   Acute CVA (cerebrovascular accident) (HCC) MRI brain showing triple small acute to subacute infarct involving bilateral brain parenchyma (left pons, inferior right cerebellum, left frontal lobe and right parietal lobe).  Also shows chronic microvascular ischemic changes and small chronic right cerebellar infarct. Suspect embolic stroke based on findings.  2D echo with normal EF and no wall motion abnormality or cardiac source of emboli.  LDL of 130.  A1c of 6.6.  Continue on Lipitor 80 mg daily.  Patient was already on baby aspirin and Plavix was added. Speech improved.  Still has right-sided weakness.  PT recommends CIR.  Pending authorization.  Neurology consult appreciated recommends to continue aspirin, Plavix and statin.  Outpatient Holter monitoring.  Allow permissive blood pressure.  Counseled on lifestyle modification including diet and exercise to lose weight, medication adherence, tight blood pressure and blood glucose control, lipid-lowering and other secondary stroke prevention.   Active Problems:    Type 2 diabetes mellitus with other specified complication (HCC) A1c of 6.6.  Monitor on sliding scale coverage.  Metformin at home.    Essential  hypertension Allow permissive blood pressure for now.  Needs tight blood pressure control as outpatient.  Obesity (BMI 39.5 kg/m) Counseled on weight loss and exercise.    DVT prophylaxis: Subcu Lovenox Code Status: Full code Family Communication: Wife involved in care Disposition:   Status is: Inpatient  Continued patient monitoring for acute stroke.  Pending disposition to rehab.  unsafe discharge home due to acute stroke and needs short-term rehab.  Dispo: The patient is from: Home              Anticipated d/c is to: SNF               Anticipated d/c date is: 1 day              Patient currently is not medically stable to d/c.       Consultants:   Neurology  Procedures: CT head, CT angio head and neck, MRI brain, 2D echo    Antimicrobials: None  Subjective: Seen and examined.  Reports his right-sided weakness to be slightly better and determined to participate with therapy.  Objective: Vitals:   10/27/19 0746 10/27/19 0815 10/27/19 0816 10/27/19 1126  BP: (!) 144/70  (!) 161/57 (!) 147/71  Pulse: 69  68 66  Resp:  17  18  Temp:    97.6 F (36.4 C)  TempSrc:    Oral  SpO2:    95%  Weight:      Height:        Intake/Output Summary (Last 24 hours) at 10/27/2019 1626 Last data filed at 10/27/2019 0956 Gross per 24 hour  Intake 240 ml  Output 1000 ml  Net -760 ml   Filed Weights   10/26/19 0215 10/26/19 0559  Weight: 124.7 kg 128.5 kg    Examination:  General: Not in distress HEENT: Tubular, supple Chest: Clear CVS: Normal S1-S2 GI: Soft, nondistended, nontender Musculoskeletal: Warm, no edema CNs: Alert and oriented, 4/5 power over right upper extremity and 4+/5 power over her right lower extremity  Data Reviewed: I have personally reviewed following labs and imaging studies  CBC: Recent Labs  Lab 10/26/19 0214  WBC 5.6  NEUTROABS 3.5  HGB 14.8  HCT 43.2  MCV 92.5  PLT 143*    Basic Metabolic Panel: Recent Labs  Lab 10/26/19 0214   NA 137  K 4.2  CL 104  CO2 23  GLUCOSE 136*  BUN 27*  CREATININE 1.20  CALCIUM 9.5    GFR: Estimated Creatinine Clearance: 67.1 mL/min (by C-G formula based on SCr of 1.2 mg/dL).  Liver Function Tests: Recent Labs  Lab 10/26/19 0214  AST 51*  ALT 37  ALKPHOS 90  BILITOT 0.9  PROT 7.6  ALBUMIN 4.0    CBG: Recent Labs  Lab 10/26/19 1115 10/26/19 1636 10/26/19 2102 10/27/19 0742 10/27/19 1121  GLUCAP 119* 109* 116* 121* 134*     Recent Results (from the past 240 hour(s))  Respiratory Panel by RT PCR (Flu A&B, Covid) - Nasopharyngeal Swab     Status: None   Collection Time: 10/26/19  4:50 AM   Specimen: Nasopharyngeal Swab  Result Value Ref Range Status   SARS Coronavirus 2 by RT PCR NEGATIVE NEGATIVE Final    Comment: (NOTE) SARS-CoV-2 target nucleic acids are NOT DETECTED. The SARS-CoV-2 RNA is generally detectable in upper respiratoy specimens during the acute phase of infection. The lowest concentration of SARS-CoV-2 viral copies this assay can detect is 131 copies/mL. A negative result does not preclude SARS-Cov-2 infection and should not be used as the sole basis for treatment or other patient management decisions. A negative result may occur with  improper specimen collection/handling, submission of specimen other than nasopharyngeal swab, presence of viral mutation(s) within the areas targeted by this assay, and inadequate number of viral copies (<131 copies/mL). A negative result must be combined with clinical observations, patient history, and epidemiological information. The expected result is Negative. Fact Sheet for Patients:  https://www.moore.com/https://www.fda.gov/media/142436/download Fact Sheet for Healthcare Providers:  https://www.young.biz/https://www.fda.gov/media/142435/download This test is not yet ap proved or cleared by the [place] FDA and  has been authorized for detection and/or diagnosis of SARS-CoV-2 by FDA under an Emergency Use Authorization (EUA). This EUA  will remain  in effect (meaning this test can be used) for the duration of the COVID-19 declaration under Section 564(b)(1) of the Act, 21 U.S.C. section 360bbb-3(b)(1), unless the authorization is terminated or revoked sooner.    Influenza A by PCR NEGATIVE NEGATIVE Final   Influenza B by PCR NEGATIVE NEGATIVE Final    Comment: (NOTE) The Xpert Xpress SARS-CoV-2/FLU/RSV assay is intended as an aid in  the diagnosis of influenza from Nasopharyngeal swab specimens and  should not be used as a sole basis for treatment. Nasal washings and  aspirates are unacceptable for Xpert Xpress SARS-CoV-2/FLU/RSV  testing. Fact Sheet for Patients: https://www.moore.com/https://www.fda.gov/media/142436/download Fact Sheet for Healthcare Providers: https://www.young.biz/https://www.fda.gov/media/142435/download This test is not yet approved or cleared by the [place] FDA and  has been authorized for detection and/or diagnosis of SARS-CoV-2 by  FDA under an Emergency Use Authorization (EUA). This EUA will remain  in effect (meaning this test can be used) for  the duration of the  Covid-19 declaration under Section 564(b)(1) of the Act, 21  U.S.C. section 360bbb-3(b)(1), unless the authorization is  terminated or revoked. Performed at [facility], [address] [place], [place] [address]          Radiology Studies: CT Angio Head W or Wo Contrast  Result Date: 10/26/2019 CLINICAL DATA:  Slurred speech and right-sided weakness EXAM: CT ANGIOGRAPHY HEAD AND NECK CT PERFUSION BRAIN TECHNIQUE: Multidetector CT imaging of the head and neck was performed using the standard protocol during bolus administration of intravenous contrast. Multiplanar CT image reconstructions and MIPs were obtained to evaluate the vascular anatomy. Carotid stenosis measurements (when applicable) are obtained utilizing NASCET criteria, using the distal internal carotid diameter as the denominator. Multiphase CT imaging of the brain was performed following  IV bolus contrast injection. Subsequent parametric perfusion maps were calculated using RAPID software. CONTRAST:  130mL OMNIPAQUE IOHEXOL 350 MG/ML SOLN COMPARISON:  None. FINDINGS: CT HEAD FINDINGS Brain: There is no mass, hemorrhage or extra-axial collection. The size and configuration of the ventricles and extra-axial CSF spaces are normal. No acute cortical infarct. There is hypoattenuation of the periventricular white matter, most commonly indicating chronic ischemic microangiopathy. Skull: The visualized skull base, calvarium and extracranial soft tissues are normal. Sinuses/Orbits: No fluid levels or advanced mucosal thickening of the visualized paranasal sinuses. No mastoid or middle ear effusion. The orbits are normal. CTA NECK FINDINGS SKELETON: Grade 1 anterolisthesis at C4-5. OTHER NECK: Normal pharynx, larynx and major salivary glands. No cervical lymphadenopathy. Unremarkable thyroid gland. UPPER CHEST: No pneumothorax or pleural effusion. No nodules or masses. AORTIC ARCH: There is mild calcific atherosclerosis of the aortic arch. There is no aneurysm, dissection or hemodynamically significant stenosis of the visualized portion of the aorta. Conventional 3 vessel aortic branching pattern. The visualized proximal subclavian arteries are widely patent. RIGHT CAROTID SYSTEM: No dissection, occlusion or aneurysm. Mild atherosclerotic calcification at the carotid bifurcation without hemodynamically significant stenosis. LEFT CAROTID SYSTEM: No dissection, occlusion or aneurysm. Mild atherosclerotic calcification at the carotid bifurcation without hemodynamically significant stenosis. VERTEBRAL ARTERIES: Right dominant configuration. The left vertebral artery is non-opacified at multiple locations of the V1 segment and proximal V2 segment. The distal V4 segment is also poorly opacified, but this might be partially due to poor contrast bolus. There is multifocal mild atherosclerosis of the right vertebral  artery. CTA HEAD FINDINGS POSTERIOR CIRCULATION: --Vertebral arteries: Non opacification of the distal V4 segment of the left vertebral artery. Moderate atherosclerotic narrowing of the right V4 segment. --Posterior inferior cerebellar arteries (PICA): Patent origins from the vertebral arteries. --Anterior inferior cerebellar arteries (AICA): Patent origins from the basilar artery. --Basilar artery: Normal. --Superior cerebellar arteries: Normal. --Posterior cerebral arteries: Normal. Both originate from the basilar artery. Posterior communicating arteries (p-comm) are diminutive or absent. ANTERIOR CIRCULATION: --Intracranial internal carotid arteries: Normal. --Anterior cerebral arteries (ACA): Normal. Both A1 segments are present. Patent anterior communicating artery (a-comm). --Middle cerebral arteries (MCA): Normal. VENOUS SINUSES: As permitted by contrast timing, patent. ANATOMIC VARIANTS: None Review of the MIP images confirms the above findings. CT Brain Perfusion Findings: ASPECTS: 10 CBF (<30%) Volume: 5mL Perfusion (Tmax>6.0s) volume: 29mL Mismatch Volume: 30mL Infarction Location:None Note: The calculated area of ischemia is likely artifactual and appears to correspond to the CSF space in the posterior fossa. IMPRESSION: 1. No emergent large vessel occlusion or high-grade stenosis of the intracranial arteries. 2. Occlusion of the left vertebral artery at multiple locations of the V1 segment and proximal V2  segment, with poor opacification of the distal V4 segment. This is likely secondary to atherosclerotic disease and exaggerated by a relatively poor contrast bolus. 3. No acute infarct by CT perfusion analysis. Small region of calculated penumbra in the left posterior fossa is considered artifactual. 4. Aortic Atherosclerosis (ICD10-I70.0). Electronically Signed   By: Deatra Robinson M.D.   On: 10/26/2019 03:23   CT ANGIO NECK W OR WO CONTRAST  Result Date: 10/26/2019 CLINICAL DATA:  Slurred speech and  right-sided weakness EXAM: CT ANGIOGRAPHY HEAD AND NECK CT PERFUSION BRAIN TECHNIQUE: Multidetector CT imaging of the head and neck was performed using the standard protocol during bolus administration of intravenous contrast. Multiplanar CT image reconstructions and MIPs were obtained to evaluate the vascular anatomy. Carotid stenosis measurements (when applicable) are obtained utilizing NASCET criteria, using the distal internal carotid diameter as the denominator. Multiphase CT imaging of the brain was performed following IV bolus contrast injection. Subsequent parametric perfusion maps were calculated using RAPID software. CONTRAST:  OMNIPAQUE IOHEXOL 350 MG/ML SOLN COMPARISON:  None. FINDINGS: CT HEAD FINDINGS Brain: There is no mass, hemorrhage or extra-axial collection. The size and configuration of the ventricles and extra-axial CSF spaces are normal. No acute cortical infarct. There is hypoattenuation of the periventricular white matter, most commonly indicating chronic ischemic microangiopathy. Skull: The visualized skull base, calvarium and extracranial soft tissues are normal. Sinuses/Orbits: No fluid levels or advanced mucosal thickening of the visualized paranasal sinuses. No mastoid or middle ear effusion. The orbits are normal. CTA NECK FINDINGS SKELETON: Grade 1 anterolisthesis at C4-5. OTHER NECK: Normal pharynx, larynx and major salivary glands. No cervical lymphadenopathy. Unremarkable thyroid gland. UPPER CHEST: No pneumothorax or pleural effusion. No nodules or masses. AORTIC ARCH: There is mild calcific atherosclerosis of the aortic arch. There is no aneurysm, dissection or hemodynamically significant stenosis of the visualized portion of the aorta. Conventional 3 vessel aortic branching pattern. The visualized proximal subclavian arteries are widely patent. RIGHT CAROTID SYSTEM: No dissection, occlusion or aneurysm. Mild atherosclerotic calcification at the carotid bifurcation without  hemodynamically significant stenosis. LEFT CAROTID SYSTEM: No dissection, occlusion or aneurysm. Mild atherosclerotic calcification at the carotid bifurcation without hemodynamically significant stenosis. VERTEBRAL ARTERIES: Right dominant configuration. The left vertebral artery is non-opacified at multiple locations of the V1 segment and proximal V2 segment. The distal V4 segment is also poorly opacified, but this might be partially due to poor contrast bolus. There is multifocal mild atherosclerosis of the right vertebral artery. CTA HEAD FINDINGS POSTERIOR CIRCULATION: --Vertebral arteries: Non opacification of the distal V4 segment of the left vertebral artery. Moderate atherosclerotic narrowing of the right V4 segment. --Posterior inferior cerebellar arteries (PICA): Patent origins from the vertebral arteries. --Anterior inferior cerebellar arteries (AICA): Patent origins from the basilar artery. --Basilar artery: Normal. --Superior cerebellar arteries: Normal. --Posterior cerebral arteries: Normal. Both originate from the basilar artery. Posterior communicating arteries (p-comm) are diminutive or absent. ANTERIOR CIRCULATION: --Intracranial internal carotid arteries: Normal. --Anterior cerebral arteries (ACA): Normal. Both A1 segments are present. Patent anterior communicating artery (a-comm). --Middle cerebral arteries (MCA): Normal. VENOUS SINUSES: As permitted by contrast timing, patent. ANATOMIC VARIANTS: None Review of the MIP images confirms the above findings. CT Brain Perfusion Findings: ASPECTS: 10 CBF (<30%) Volume: 0mL Perfusion (Tmax>6.0s) volume: 6mL Mismatch Volume: 6mL Infarction Location:None Note: The calculated area of ischemia is likely artifactual and appears to correspond to the CSF space in the posterior fossa. IMPRESSION: 1. No emergent large vessel occlusion or high-grade stenosis of the intracranial arteries. 2.  Occlusion of the left vertebral artery at multiple locations of the V1  segment and proximal V2 segment, with poor opacification of the distal V4 segment. This is likely secondary to atherosclerotic disease and exaggerated by a relatively poor contrast bolus. 3. No acute infarct by CT perfusion analysis. Small region of calculated penumbra in the left posterior fossa is considered artifactual. 4. Aortic Atherosclerosis (ICD10-I70.0). Electronically Signed   By: Deatra Robinson M.D.   On: 10/26/2019 03:23   MR BRAIN WO CONTRAST  Result Date: 10/26/2019 CLINICAL DATA:  Slurred speech right leg weakness, code stroke follow-up EXAM: MRI HEAD WITHOUT CONTRAST TECHNIQUE: Multiplanar, multiecho pulse sequences of the brain and surrounding structures were obtained without intravenous contrast. COMPARISON:  Correlation made with prior CT imaging FINDINGS: Brain: There is an approximately 11 mm focus of mildly reduced diffusion involving the left aspect of pons. Additional smaller foci of mildly reduced diffusion the inferior right cerebellum (image 8), left frontal lobe (image 39), and right parietal lobe (image 34). There is no evidence of intracranial hemorrhage. There is no intracranial mass, mass effect, or edema. There is no hydrocephalus or extra-axial fluid collection. Patchy foci of T2 hyperintensity in the supratentorial white matter are nonspecific but may reflect mild to moderate chronic microvascular ischemic changes. Small chronic right cerebellar infarcts. Prominence of ventricles and sulci reflects minor generalized parenchymal volume loss. Vascular: Diminished flow void of diminutive left vertebral artery. Major vessel flow voids at the skull base are otherwise preserved. Skull and upper cervical spine: Normal marrow signal is preserved. Sinuses/Orbits: Minor mucosal thickening. Bilateral lens replacements. Other: Sella is unremarkable.  Mastoid air cells are clear. IMPRESSION: Few small acute to subacute infarcts involving multiple vascular territories. Chronic microvascular  ischemic changes. Small chronic right cerebellar infarcts. Electronically Signed   By: Guadlupe Spanish M.D.   On: 10/26/2019 07:50   US Venous Img Lower Unilateral Left (DVT)  Result Date: 10/26/2019 CLINICAL DATA:  Left lower extremity edema. EXAM: LEFT LOWER EXTREMITY VENOUS DOPPLER ULTRASOUND TECHNIQUE: Gray-scale sonography with graded compression, as well as color Doppler and duplex ultrasound were performed to evaluate the lower extremity deep venous systems from the level of the common femoral vein and including the common femoral, femoral, profunda femoral, popliteal and calf veins including the posterior tibial, peroneal and gastrocnemius veins when visible. The superficial great saphenous vein was also interrogated. Spectral Doppler was utilized to evaluate flow at rest and with distal augmentation maneuvers in the common femoral, femoral and popliteal veins. COMPARISON:  02/03/2016 FINDINGS: Contralateral Common Femoral Vein: Respiratory phasicity is normal and symmetric with the symptomatic side. No evidence of thrombus. Normal compressibility. Common Femoral Vein: No evidence of thrombus. Normal compressibility, respiratory phasicity and response to augmentation. Saphenofemoral Junction: No evidence of thrombus. Normal compressibility and flow on color Doppler imaging. Profunda Femoral Vein: No evidence of thrombus. Normal compressibility and flow on color Doppler imaging. Femoral Vein: No evidence of thrombus. Normal compressibility, respiratory phasicity and response to augmentation. Popliteal Vein: No evidence of thrombus. Normal compressibility, respiratory phasicity and response to augmentation. Calf Veins: No evidence of thrombus. Normal compressibility and flow on color Doppler imaging. Superficial Great Saphenous Vein: No evidence of thrombus. Normal compressibility. Venous Reflux:  None. Other Findings: No evidence of superficial thrombophlebitis or abnormal fluid collection. IMPRESSION: No  evidence of left lower extremity deep venous thrombosis. Electronically Signed   By: Irish Lack M.D.   On: 10/26/2019 14:43   CT CEREBRAL PERFUSION W CONTRAST  Result Date: 10/26/2019 CLINICAL  DATA:  Slurred speech and right-sided weakness EXAM: CT ANGIOGRAPHY HEAD AND NECK CT PERFUSION BRAIN TECHNIQUE: Multidetector CT imaging of the head and neck was performed using the standard protocol during bolus administration of intravenous contrast. Multiplanar CT image reconstructions and MIPs were obtained to evaluate the vascular anatomy. Carotid stenosis measurements (when applicable) are obtained utilizing NASCET criteria, using the distal internal carotid diameter as the denominator. Multiphase CT imaging of the brain was performed following IV bolus contrast injection. Subsequent parametric perfusion maps were calculated using RAPID software. CONTRAST:  OMNIPAQUE IOHEXOL 350 MG/ML SOLN COMPARISON:  None. FINDINGS: CT HEAD FINDINGS Brain: There is no mass, hemorrhage or extra-axial collection. The size and configuration of the ventricles and extra-axial CSF spaces are normal. No acute cortical infarct. There is hypoattenuation of the periventricular white matter, most commonly indicating chronic ischemic microangiopathy. Skull: The visualized skull base, calvarium and extracranial soft tissues are normal. Sinuses/Orbits: No fluid levels or advanced mucosal thickening of the visualized paranasal sinuses. No mastoid or middle ear effusion. The orbits are normal. CTA NECK FINDINGS SKELETON: Grade 1 anterolisthesis at C4-5. OTHER NECK: Normal pharynx, larynx and major salivary glands. No cervical lymphadenopathy. Unremarkable thyroid gland. UPPER CHEST: No pneumothorax or pleural effusion. No nodules or masses. AORTIC ARCH: There is mild calcific atherosclerosis of the aortic arch. There is no aneurysm, dissection or hemodynamically significant stenosis of the visualized portion of the aorta. Conventional 3  vessel aortic branching pattern. The visualized proximal subclavian arteries are widely patent. RIGHT CAROTID SYSTEM: No dissection, occlusion or aneurysm. Mild atherosclerotic calcification at the carotid bifurcation without hemodynamically significant stenosis. LEFT CAROTID SYSTEM: No dissection, occlusion or aneurysm. Mild atherosclerotic calcification at the carotid bifurcation without hemodynamically significant stenosis. VERTEBRAL ARTERIES: Right dominant configuration. The left vertebral artery is non-opacified at multiple locations of the V1 segment and proximal V2 segment. The distal V4 segment is also poorly opacified, but this might be partially due to poor contrast bolus. There is multifocal mild atherosclerosis of the right vertebral artery. CTA HEAD FINDINGS POSTERIOR CIRCULATION: --Vertebral arteries: Non opacification of the distal V4 segment of the left vertebral artery. Moderate atherosclerotic narrowing of the right V4 segment. --Posterior inferior cerebellar arteries (PICA): Patent origins from the vertebral arteries. --Anterior inferior cerebellar arteries (AICA): Patent origins from the basilar artery. --Basilar artery: Normal. --Superior cerebellar arteries: Normal. --Posterior cerebral arteries: Normal. Both originate from the basilar artery. Posterior communicating arteries (p-comm) are diminutive or absent. ANTERIOR CIRCULATION: --Intracranial internal carotid arteries: Normal. --Anterior cerebral arteries (ACA): Normal. Both A1 segments are present. Patent anterior communicating artery (a-comm). --Middle cerebral arteries (MCA): Normal. VENOUS SINUSES: As permitted by contrast timing, patent. ANATOMIC VARIANTS: None Review of the MIP images confirms the above findings. CT Brain Perfusion Findings: ASPECTS: 10 CBF (<30%) Volume: 0mL Perfusion (Tmax>6.0s) volume: 6mL Mismatch Volume: 6mL Infarction Location:None Note: The calculated area of ischemia is likely artifactual and appears to  correspond to the CSF space in the posterior fossa. IMPRESSION: 1. No emergent large vessel occlusion or high-grade stenosis of the intracranial arteries. 2. Occlusion of the left vertebral artery at multiple locations of the V1 segment and proximal V2 segment, with poor opacification of the distal V4 segment. This is likely secondary to atherosclerotic disease and exaggerated by a relatively poor contrast bolus. 3. No acute infarct by CT perfusion analysis. Small region of calculated penumbra in the left posterior fossa is considered artifactual. 4. Aortic Atherosclerosis (ICD10-I70.0). Electronically Signed   By: Deatra Robinson M.D.   On:  10/26/2019 03:23   ECHOCARDIOGRAM COMPLETE  Result Date: 10/27/2019    ECHOCARDIOGRAM REPORT   Patient Name:   ARBEN PACKMAN Date of Exam: 10/26/2019 Medical Rec #:  [id number]       Height:       71.0 in Accession #:    [id number]      Weight:       283.3 lb Date of Birth:  [birth year]      BSA:          2.445 m Patient Age:    78 years        BP:           142/75 mmHg Patient Gender: M               HR:           92 bpm. Exam Location:  [facility] Procedure: 2D Echo and Intracardiac Opacification Agent Indications:     Stroke 434.91/ I163.9  History:         Patient has no prior history of Echocardiogram examinations.  Sonographer:     Wonda Cerise RDCS Referring Phys:  [id number] Andris Baumann Diagnosing Phys: Harold Hedge MD  Sonographer Comments: Technically challenging study due to limited acoustic windows, Technically difficult study due to poor echo windows and patient is morbidly obese. Image acquisition challenging due to patient body habitus. IMPRESSIONS  1. Left ventricular ejection fraction, by estimation, is 60 to 65%. The left ventricle has normal function. The left ventricle has no regional wall motion abnormalities. There is mild left ventricular hypertrophy. Left ventricular diastolic parameters are consistent with Grade I diastolic dysfunction (impaired  relaxation).  2. Right ventricular systolic function is normal. The right ventricular size is normal.  3. The mitral valve is degenerative. Trivial mitral valve regurgitation. No evidence of mitral stenosis.  4. The aortic valve was not well visualized. Aortic valve regurgitation is trivial.  5. Aortic dilatation noted. FINDINGS  Left Ventricle: Left ventricular ejection fraction, by estimation, is 60 to 65%. The left ventricle has normal function. The left ventricle has no regional wall motion abnormalities. Definity contrast agent was given IV to delineate the left ventricular  endocardial borders. The left ventricular internal cavity size was normal in size. There is mild left ventricular hypertrophy. Left ventricular diastolic parameters are consistent with Grade I diastolic dysfunction (impaired relaxation). Right Ventricle: The right ventricular size is normal. No increase in right ventricular wall thickness. Right ventricular systolic function is normal. Left Atrium: Left atrial size was normal in size. Right Atrium: Right atrial size was normal in size. Pericardium: There is no evidence of pericardial effusion. Mitral Valve: The mitral valve is degenerative in appearance. Trivial mitral valve regurgitation. No evidence of mitral valve stenosis. MV peak gradient, 11.3 mmHg. The mean mitral valve gradient is 5.0 mmHg. Tricuspid Valve: The tricuspid valve is not well visualized. Tricuspid valve regurgitation is trivial. Aortic Valve: The aortic valve was not well visualized. Aortic valve regurgitation is trivial. Aortic valve peak gradient measures 5.1 mmHg. Pulmonic Valve: The pulmonic valve was not well visualized. Pulmonic valve regurgitation is not visualized. Aorta: Aortic dilatation noted. IAS/Shunts: The interatrial septum was not assessed.  LEFT VENTRICLE PLAX 2D LVIDd:         3.55 cm      Diastology LVIDs:         2.51 cm      LV e' lateral:   8.27 cm/s LV PW:  1.19 cm      LV E/e' lateral:  14.8 LV IVS:        1.58 cm      LV e' medial:    3.81 cm/s LVOT diam:     2.20 cm      LV E/e' medial:  32.0 LV SV:         55 LV SV Index:   22 LVOT Area:     3.80 cm  LV Volumes (MOD) LV vol d, MOD A2C: 96.3 ml LV vol d, MOD A4C: 133.0 ml LV vol s, MOD A2C: 38.5 ml LV vol s, MOD A4C: 41.5 ml LV SV MOD A2C:     57.8 ml LV SV MOD A4C:     133.0 ml LV SV MOD BP:      75.9 ml RIGHT VENTRICLE RV Basal diam:  3.02 cm RV S prime:     10.40 cm/s TAPSE (M-mode): 1.9 cm LEFT ATRIUM             Index LA diam:        3.60 cm 1.47 cm/m LA Vol (A2C):   24.0 ml 9.82 ml/m LA Vol (A4C):   59.6 ml 24.38 ml/m LA Biplane Vol: 40.5 ml 16.57 ml/m  AORTIC VALVE AV Area (Vmax): 2.58 cm AV Vmax:        113.00 cm/s AV Peak Grad:   5.1 mmHg LVOT Vmax:      76.60 cm/s LVOT Vmean:     47.500 cm/s LVOT VTI:       0.144 m  AORTA Ao Root diam: 3.60 cm MITRAL VALVE MV Area (PHT): 4.21 cm     SHUNTS MV Peak grad:  11.3 mmHg    Systemic VTI:  0.14 m MV Mean grad:  5.0 mmHg     Systemic Diam: 2.20 cm MV Vmax:       1.68 m/s MV Vmean:      99.9 cm/s MV Decel Time: 180 msec MV E velocity: 122.00 cm/s MV A velocity: 171.00 cm/s MV E/A ratio:  0.71 Harold Hedge MD Electronically signed by Harold Hedge MD Signature Date/Time: 10/27/2019/7:18:59 AM    Final    CT HEAD CODE STROKE WO CONTRAST`  Result Date: 10/26/2019 CLINICAL DATA:  Code stroke.  Slurred speech and right side weakness EXAM: CT HEAD WITHOUT CONTRAST TECHNIQUE: Contiguous axial images were obtained from the base of the skull through the vertex without intravenous contrast. COMPARISON:  None. FINDINGS: Brain: There is no mass, hemorrhage or extra-axial collection. The size and configuration of the ventricles and extra-axial CSF spaces are normal. There is hypoattenuation of the periventricular white matter, most commonly indicating chronic ischemic microangiopathy. Old right cerebellar infarct. Vascular: Atherosclerotic calcification of the vertebral and internal carotid arteries at  the skull base. No abnormal hyperdensity of the major intracranial arteries or dural venous sinuses. Skull: The visualized skull base, calvarium and extracranial soft tissues are normal. Sinuses/Orbits: No fluid levels or advanced mucosal thickening of the visualized paranasal sinuses. No mastoid or middle ear effusion. The orbits are normal. ASPECTS [facility] Stroke Program Early CT Score) - Ganglionic level infarction (caudate, lentiform nuclei, internal capsule, insula, M1-M3 cortex): 7 - Supraganglionic infarction (M4-M6 cortex): 3 Total score (0-10 with 10 being normal): 10 IMPRESSION: 1. No acute intracranial abnormality. 2. ASPECTS is 10. 3. 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 * These results were called by telephone at the time of interpretation on 10/26/2019 at 2:14 am to provider [facility] ,  who verbally acknowledged these results. Electronically Signed   By: Deatra Robinson M.D.   On: 10/26/2019 02:14        Scheduled Meds: .  stroke: mapping our early stages of recovery book   Does not apply Once  . aspirin EC  81 mg Oral Daily  . aspirin  300 mg Rectal Once  . atorvastatin  40 mg Oral QODAY  . clopidogrel  75 mg Oral Daily  . docusate sodium  100 mg Oral BID  . enoxaparin (LOVENOX) injection  40 mg Subcutaneous Q24H  . gabapentin  600 mg Oral TID  . insulin aspart  0-15 Units Subcutaneous TID WC  . insulin aspart  0-5 Units Subcutaneous QHS  . loratadine  10 mg Oral Daily   Continuous Infusions: . sodium chloride       LOS: 1 day    Time spent: 25 minutes    Xaivier Malay, MD [facility]   To contact the attending provider between 7A-7P or the covering provider during after hours 7P-7A, please log into the web site [email or web address] and access using universal [employer] password for that web site. If you do not have the password, please call the hospital operator.  10/27/2019, 4:26 PM

## 2019-10-27 NOTE — Progress Notes (Signed)
Subjective: Strength much improved today. Antigravity on R side.    Past Medical History:  Diagnosis Date  . GERD (gastroesophageal reflux disease)    occ  . Hyperlipemia   . Hypertension     Past Surgical History:  Procedure Laterality Date  . BACK SURGERY  05/2011  . EYE SURGERY     cataracts  . LUMBAR LAMINECTOMY/DECOMPRESSION MICRODISCECTOMY Left 01/20/2016   Procedure: Left Lumbar two-three, Lumbar three-four,  Lumbar four-five Microdiskectomy;  Surgeon: Tressie Stalker, MD;  Location: [facility];  Service: Neurosurgery;  Laterality: Left;    No family history on file.  Social History:  reports that he has never smoked. He has never used smokeless tobacco. He reports previous alcohol use. He reports that he does not use drugs.  Allergies  Allergen Reactions  . Other Other (See Comments)    STEROIDS, not recorded, caused tachycardia Pt "does not know name"    Medications: I have reviewed the patient's current medications.   Physical Examination: Blood pressure (!) 161/57, pulse 68, temperature 97.9 F (36.6 C), temperature source Oral, resp. rate 17, height 5\' 11"  (1.803 m), weight 128.5 kg, SpO2 95 %.    Neurological Examination   Mental Status: Alert, oriented, thought content appropriate.  Speech fluent. Hard of hearing.  Cranial Nerves: II: Discs flat bilaterally; Visual fields grossly normal, pupils equal, round, reactive to light and accommodation III,IV, VI: ptosis not present, extra-ocular motions intact bilaterally V,VII: R facial droop  VIII: decreased hearing  IX,X: gag reflex present XI: bilateral shoulder shrug XII: midline tongue extension Motor: Right : Upper extremity   4+/5    Left:     Upper extremity   5/5  Lower extremity   4+/5     Lower extremity   5/5 Tone and bulk:normal tone throughout; no atrophy noted Sensory: Pinprick and light touch intact throughout, bilaterally Deep Tendon Reflexes: 1+ and symmetric  throughout Plantars: Right: downgoing   Left: downgoing Cerebellar: normal finger-to-nose    Laboratory Studies:   Basic Metabolic Panel: Recent Labs  Lab 10/26/19 0214  NA 137  K 4.2  CL 104  CO2 23  GLUCOSE 136*  BUN 27*  CREATININE 1.20  CALCIUM 9.5    Liver Function Tests: Recent Labs  Lab 10/26/19 0214  AST 51*  ALT 37  ALKPHOS 90  BILITOT 0.9  PROT 7.6  ALBUMIN 4.0   No results for input(s): LIPASE, AMYLASE in the last 168 hours. No results for input(s): AMMONIA in the last 168 hours.  CBC: Recent Labs  Lab 10/26/19 0214  WBC 5.6  NEUTROABS 3.5  HGB 14.8  HCT 43.2  MCV 92.5  PLT 143*    Cardiac Enzymes: No results for input(s): CKTOTAL, CKMB, CKMBINDEX, TROPONINI in the last 168 hours.  BNP: Invalid input(s): POCBNP  CBG: Recent Labs  Lab 10/26/19 0738 10/26/19 1115 10/26/19 1636 10/26/19 2102 10/27/19 0742  GLUCAP 124* 119* 109* 116* 121*    Microbiology: Results for orders placed or performed during the hospital encounter of 10/26/19  Respiratory Panel by RT PCR (Flu A&B, Covid) - Nasopharyngeal Swab     Status: None   Collection Time: 10/26/19  4:50 AM   Specimen: Nasopharyngeal Swab  Result Value Ref Range Status   SARS Coronavirus 2 by RT PCR NEGATIVE NEGATIVE Final    Comment: (NOTE) SARS-CoV-2 target nucleic acids are NOT DETECTED. The SARS-CoV-2 RNA is generally detectable in upper respiratoy specimens during the acute phase of infection. The lowest concentration of  SARS-CoV-2 viral copies this assay can detect is 131 copies/mL. A negative result does not preclude SARS-Cov-2 infection and should not be used as the sole basis for treatment or other patient management decisions. A negative result may occur with  improper specimen collection/handling, submission of specimen other than nasopharyngeal swab, presence of viral mutation(s) within the areas targeted by this assay, and inadequate number of viral copies (<131  copies/mL). A negative result must be combined with clinical observations, patient history, and epidemiological information. The expected result is Negative. Fact Sheet for Patients:  https://www.moore.com/ Fact Sheet for Healthcare Providers:  https://www.young.biz/ This test is not yet ap proved or cleared by the [place] FDA and  has been authorized for detection and/or diagnosis of SARS-CoV-2 by FDA under an Emergency Use Authorization (EUA). This EUA will remain  in effect (meaning this test can be used) for the duration of the COVID-19 declaration under Section 564(b)(1) of the Act, 21 U.S.C. section 360bbb-3(b)(1), unless the authorization is terminated or revoked sooner.    Influenza A by PCR NEGATIVE NEGATIVE Final   Influenza B by PCR NEGATIVE NEGATIVE Final    Comment: (NOTE) The Xpert Xpress SARS-CoV-2/FLU/RSV assay is intended as an aid in  the diagnosis of influenza from Nasopharyngeal swab specimens and  should not be used as a sole basis for treatment. Nasal washings and  aspirates are unacceptable for Xpert Xpress SARS-CoV-2/FLU/RSV  testing. Fact Sheet for Patients: https://www.moore.com/ Fact Sheet for Healthcare Providers: https://www.young.biz/ This test is not yet approved or cleared by the [place] FDA and  has been authorized for detection and/or diagnosis of SARS-CoV-2 by  FDA under an Emergency Use Authorization (EUA). This EUA will remain  in effect (meaning this test can be used) for the duration of the  Covid-19 declaration under Section 564(b)(1) of the Act, 21  U.S.C. section 360bbb-3(b)(1), unless the authorization is  terminated or revoked. Performed at [facility], [address] [place], [place] [address]     Coagulation Studies: Recent Labs    10/26/19 0214  LABPROT 14.6  INR 1.2    Urinalysis:  Recent Labs  Lab 10/26/19 0340   COLORURINE YELLOW*  LABSPEC 1.020  PHURINE 7.0  GLUCOSEU NEGATIVE  HGBUR NEGATIVE  BILIRUBINUR NEGATIVE  KETONESUR NEGATIVE  PROTEINUR NEGATIVE  NITRITE NEGATIVE  LEUKOCYTESUR NEGATIVE    Lipid Panel:     Component Value Date/Time   CHOL 205 (H) 10/26/2019 0214   TRIG 80 10/26/2019 0214   HDL 56 10/26/2019 0214   CHOLHDL 3.7 10/26/2019 0214   VLDL 16 10/26/2019 0214   LDLCALC 133 (H) 10/26/2019 0214    HgbA1C:  Lab Results  Component Value Date   HGBA1C 6.6 (H) 10/26/2019    Urine Drug Screen:      Component Value Date/Time   LABOPIA NONE DETECTED 10/26/2019 0340   COCAINSCRNUR NONE DETECTED 10/26/2019 0340   LABBENZ NONE DETECTED 10/26/2019 0340   AMPHETMU NONE DETECTED 10/26/2019 0340   THCU NONE DETECTED 10/26/2019 0340   LABBARB NONE DETECTED 10/26/2019 0340    Alcohol Level:  Recent Labs  Lab 10/26/19 0214  ETH <10    Other results: EKG: normal EKG, normal sinus rhythm, unchanged from previous tracings.  Imaging: CT Angio Head W or Wo Contrast  Result Date: 10/26/2019 CLINICAL DATA:  Slurred speech and right-sided weakness EXAM: CT ANGIOGRAPHY HEAD AND NECK CT PERFUSION BRAIN TECHNIQUE: Multidetector CT imaging of the head and neck was performed using the standard protocol during bolus  administration of intravenous contrast. Multiplanar CT image reconstructions and MIPs were obtained to evaluate the vascular anatomy. Carotid stenosis measurements (when applicable) are obtained utilizing NASCET criteria, using the distal internal carotid diameter as the denominator. Multiphase CT imaging of the brain was performed following IV bolus contrast injection. Subsequent parametric perfusion maps were calculated using RAPID software. CONTRAST:  OMNIPAQUE IOHEXOL 350 MG/ML SOLN COMPARISON:  None. FINDINGS: CT HEAD FINDINGS Brain: There is no mass, hemorrhage or extra-axial collection. The size and configuration of the ventricles and extra-axial CSF spaces are  normal. No acute cortical infarct. There is hypoattenuation of the periventricular white matter, most commonly indicating chronic ischemic microangiopathy. Skull: The visualized skull base, calvarium and extracranial soft tissues are normal. Sinuses/Orbits: No fluid levels or advanced mucosal thickening of the visualized paranasal sinuses. No mastoid or middle ear effusion. The orbits are normal. CTA NECK FINDINGS SKELETON: Grade 1 anterolisthesis at C4-5. OTHER NECK: Normal pharynx, larynx and major salivary glands. No cervical lymphadenopathy. Unremarkable thyroid gland. UPPER CHEST: No pneumothorax or pleural effusion. No nodules or masses. AORTIC ARCH: There is mild calcific atherosclerosis of the aortic arch. There is no aneurysm, dissection or hemodynamically significant stenosis of the visualized portion of the aorta. Conventional 3 vessel aortic branching pattern. The visualized proximal subclavian arteries are widely patent. RIGHT CAROTID SYSTEM: No dissection, occlusion or aneurysm. Mild atherosclerotic calcification at the carotid bifurcation without hemodynamically significant stenosis. LEFT CAROTID SYSTEM: No dissection, occlusion or aneurysm. Mild atherosclerotic calcification at the carotid bifurcation without hemodynamically significant stenosis. VERTEBRAL ARTERIES: Right dominant configuration. The left vertebral artery is non-opacified at multiple locations of the V1 segment and proximal V2 segment. The distal V4 segment is also poorly opacified, but this might be partially due to poor contrast bolus. There is multifocal mild atherosclerosis of the right vertebral artery. CTA HEAD FINDINGS POSTERIOR CIRCULATION: --Vertebral arteries: Non opacification of the distal V4 segment of the left vertebral artery. Moderate atherosclerotic narrowing of the right V4 segment. --Posterior inferior cerebellar arteries (PICA): Patent origins from the vertebral arteries. --Anterior inferior cerebellar arteries  (AICA): Patent origins from the basilar artery. --Basilar artery: Normal. --Superior cerebellar arteries: Normal. --Posterior cerebral arteries: Normal. Both originate from the basilar artery. Posterior communicating arteries (p-comm) are diminutive or absent. ANTERIOR CIRCULATION: --Intracranial internal carotid arteries: Normal. --Anterior cerebral arteries (ACA): Normal. Both A1 segments are present. Patent anterior communicating artery (a-comm). --Middle cerebral arteries (MCA): Normal. VENOUS SINUSES: As permitted by contrast timing, patent. ANATOMIC VARIANTS: None Review of the MIP images confirms the above findings. CT Brain Perfusion Findings: ASPECTS: 10 CBF (<30%) Volume: 0mL Perfusion (Tmax>6.0s) volume: 6mL Mismatch Volume: 6mL Infarction Location:None Note: The calculated area of ischemia is likely artifactual and appears to correspond to the CSF space in the posterior fossa. IMPRESSION: 1. No emergent large vessel occlusion or high-grade stenosis of the intracranial arteries. 2. Occlusion of the left vertebral artery at multiple locations of the V1 segment and proximal V2 segment, with poor opacification of the distal V4 segment. This is likely secondary to atherosclerotic disease and exaggerated by a relatively poor contrast bolus. 3. No acute infarct by CT perfusion analysis. Small region of calculated penumbra in the left posterior fossa is considered artifactual. 4. Aortic Atherosclerosis (ICD10-I70.0). Electronically Signed   By: Deatra Robinson M.D.   On: 10/26/2019 03:23   CT ANGIO NECK W OR WO CONTRAST  Result Date: 10/26/2019 CLINICAL DATA:  Slurred speech and right-sided weakness EXAM: CT ANGIOGRAPHY HEAD AND NECK CT PERFUSION BRAIN TECHNIQUE:  Multidetector CT imaging of the head and neck was performed using the standard protocol during bolus administration of intravenous contrast. Multiplanar CT image reconstructions and MIPs were obtained to evaluate the vascular anatomy. Carotid stenosis  measurements (when applicable) are obtained utilizing NASCET criteria, using the distal internal carotid diameter as the denominator. Multiphase CT imaging of the brain was performed following IV bolus contrast injection. Subsequent parametric perfusion maps were calculated using RAPID software. CONTRAST:  156mL OMNIPAQUE IOHEXOL 350 MG/ML SOLN COMPARISON:  None. FINDINGS: CT HEAD FINDINGS Brain: There is no mass, hemorrhage or extra-axial collection. The size and configuration of the ventricles and extra-axial CSF spaces are normal. No acute cortical infarct. There is hypoattenuation of the periventricular white matter, most commonly indicating chronic ischemic microangiopathy. Skull: The visualized skull base, calvarium and extracranial soft tissues are normal. Sinuses/Orbits: No fluid levels or advanced mucosal thickening of the visualized paranasal sinuses. No mastoid or middle ear effusion. The orbits are normal. CTA NECK FINDINGS SKELETON: Grade 1 anterolisthesis at C4-5. OTHER NECK: Normal pharynx, larynx and major salivary glands. No cervical lymphadenopathy. Unremarkable thyroid gland. UPPER CHEST: No pneumothorax or pleural effusion. No nodules or masses. AORTIC ARCH: There is mild calcific atherosclerosis of the aortic arch. There is no aneurysm, dissection or hemodynamically significant stenosis of the visualized portion of the aorta. Conventional 3 vessel aortic branching pattern. The visualized proximal subclavian arteries are widely patent. RIGHT CAROTID SYSTEM: No dissection, occlusion or aneurysm. Mild atherosclerotic calcification at the carotid bifurcation without hemodynamically significant stenosis. LEFT CAROTID SYSTEM: No dissection, occlusion or aneurysm. Mild atherosclerotic calcification at the carotid bifurcation without hemodynamically significant stenosis. VERTEBRAL ARTERIES: Right dominant configuration. The left vertebral artery is non-opacified at multiple locations of the V1 segment  and proximal V2 segment. The distal V4 segment is also poorly opacified, but this might be partially due to poor contrast bolus. There is multifocal mild atherosclerosis of the right vertebral artery. CTA HEAD FINDINGS POSTERIOR CIRCULATION: --Vertebral arteries: Non opacification of the distal V4 segment of the left vertebral artery. Moderate atherosclerotic narrowing of the right V4 segment. --Posterior inferior cerebellar arteries (PICA): Patent origins from the vertebral arteries. --Anterior inferior cerebellar arteries (AICA): Patent origins from the basilar artery. --Basilar artery: Normal. --Superior cerebellar arteries: Normal. --Posterior cerebral arteries: Normal. Both originate from the basilar artery. Posterior communicating arteries (p-comm) are diminutive or absent. ANTERIOR CIRCULATION: --Intracranial internal carotid arteries: Normal. --Anterior cerebral arteries (ACA): Normal. Both A1 segments are present. Patent anterior communicating artery (a-comm). --Middle cerebral arteries (MCA): Normal. VENOUS SINUSES: As permitted by contrast timing, patent. ANATOMIC VARIANTS: None Review of the MIP images confirms the above findings. CT Brain Perfusion Findings: ASPECTS: 10 CBF (<30%) Volume: 75mL Perfusion (Tmax>6.0s) volume: 64mL Mismatch Volume: 62mL Infarction Location:None Note: The calculated area of ischemia is likely artifactual and appears to correspond to the CSF space in the posterior fossa. IMPRESSION: 1. No emergent large vessel occlusion or high-grade stenosis of the intracranial arteries. 2. Occlusion of the left vertebral artery at multiple locations of the V1 segment and proximal V2 segment, with poor opacification of the distal V4 segment. This is likely secondary to atherosclerotic disease and exaggerated by a relatively poor contrast bolus. 3. No acute infarct by CT perfusion analysis. Small region of calculated penumbra in the left posterior fossa is considered artifactual. 4. Aortic  Atherosclerosis (ICD10-I70.0). Electronically Signed   By: Ulyses Jarred M.D.   On: 10/26/2019 03:23   MR BRAIN WO CONTRAST  Result Date: 10/26/2019 CLINICAL DATA:  Slurred  speech right leg weakness, code stroke follow-up EXAM: MRI HEAD WITHOUT CONTRAST TECHNIQUE: Multiplanar, multiecho pulse sequences of the brain and surrounding structures were obtained without intravenous contrast. COMPARISON:  Correlation made with prior CT imaging FINDINGS: Brain: There is an approximately 11 mm focus of mildly reduced diffusion involving the left aspect of pons. Additional smaller foci of mildly reduced diffusion the inferior right cerebellum (image 8), left frontal lobe (image 39), and right parietal lobe (image 34). There is no evidence of intracranial hemorrhage. There is no intracranial mass, mass effect, or edema. There is no hydrocephalus or extra-axial fluid collection. Patchy foci of T2 hyperintensity in the supratentorial white matter are nonspecific but may reflect mild to moderate chronic microvascular ischemic changes. Small chronic right cerebellar infarcts. Prominence of ventricles and sulci reflects minor generalized parenchymal volume loss. Vascular: Diminished flow void of diminutive left vertebral artery. Major vessel flow voids at the skull base are otherwise preserved. Skull and upper cervical spine: Normal marrow signal is preserved. Sinuses/Orbits: Minor mucosal thickening. Bilateral lens replacements. Other: Sella is unremarkable.  Mastoid air cells are clear. IMPRESSION: Few small acute to subacute infarcts involving multiple vascular territories. Chronic microvascular ischemic changes. Small chronic right cerebellar infarcts. Electronically Signed   By: Guadlupe Spanish M.D.   On: 10/26/2019 07:50   US Venous Img Lower Unilateral Left (DVT)  Result Date: 10/26/2019 CLINICAL DATA:  Left lower extremity edema. EXAM: LEFT LOWER EXTREMITY VENOUS DOPPLER ULTRASOUND TECHNIQUE: Gray-scale sonography with  graded compression, as well as color Doppler and duplex ultrasound were performed to evaluate the lower extremity deep venous systems from the level of the common femoral vein and including the common femoral, femoral, profunda femoral, popliteal and calf veins including the posterior tibial, peroneal and gastrocnemius veins when visible. The superficial great saphenous vein was also interrogated. Spectral Doppler was utilized to evaluate flow at rest and with distal augmentation maneuvers in the common femoral, femoral and popliteal veins. COMPARISON:  02/03/2016 FINDINGS: Contralateral Common Femoral Vein: Respiratory phasicity is normal and symmetric with the symptomatic side. No evidence of thrombus. Normal compressibility. Common Femoral Vein: No evidence of thrombus. Normal compressibility, respiratory phasicity and response to augmentation. Saphenofemoral Junction: No evidence of thrombus. Normal compressibility and flow on color Doppler imaging. Profunda Femoral Vein: No evidence of thrombus. Normal compressibility and flow on color Doppler imaging. Femoral Vein: No evidence of thrombus. Normal compressibility, respiratory phasicity and response to augmentation. Popliteal Vein: No evidence of thrombus. Normal compressibility, respiratory phasicity and response to augmentation. Calf Veins: No evidence of thrombus. Normal compressibility and flow on color Doppler imaging. Superficial Great Saphenous Vein: No evidence of thrombus. Normal compressibility. Venous Reflux:  None. Other Findings: No evidence of superficial thrombophlebitis or abnormal fluid collection. IMPRESSION: No evidence of left lower extremity deep venous thrombosis. Electronically Signed   By: Irish Lack M.D.   On: 10/26/2019 14:43   CT CEREBRAL PERFUSION W CONTRAST  Result Date: 10/26/2019 CLINICAL DATA:  Slurred speech and right-sided weakness EXAM: CT ANGIOGRAPHY HEAD AND NECK CT PERFUSION BRAIN TECHNIQUE: Multidetector CT imaging  of the head and neck was performed using the standard protocol during bolus administration of intravenous contrast. Multiplanar CT image reconstructions and MIPs were obtained to evaluate the vascular anatomy. Carotid stenosis measurements (when applicable) are obtained utilizing NASCET criteria, using the distal internal carotid diameter as the denominator. Multiphase CT imaging of the brain was performed following IV bolus contrast injection. Subsequent parametric perfusion maps were calculated using RAPID software. CONTRAST:   OMNIPAQUE IOHEXOL 350 MG/ML SOLN COMPARISON:  None. FINDINGS: CT HEAD FINDINGS Brain: There is no mass, hemorrhage or extra-axial collection. The size and configuration of the ventricles and extra-axial CSF spaces are normal. No acute cortical infarct. There is hypoattenuation of the periventricular white matter, most commonly indicating chronic ischemic microangiopathy. Skull: The visualized skull base, calvarium and extracranial soft tissues are normal. Sinuses/Orbits: No fluid levels or advanced mucosal thickening of the visualized paranasal sinuses. No mastoid or middle ear effusion. The orbits are normal. CTA NECK FINDINGS SKELETON: Grade 1 anterolisthesis at C4-5. OTHER NECK: Normal pharynx, larynx and major salivary glands. No cervical lymphadenopathy. Unremarkable thyroid gland. UPPER CHEST: No pneumothorax or pleural effusion. No nodules or masses. AORTIC ARCH: There is mild calcific atherosclerosis of the aortic arch. There is no aneurysm, dissection or hemodynamically significant stenosis of the visualized portion of the aorta. Conventional 3 vessel aortic branching pattern. The visualized proximal subclavian arteries are widely patent. RIGHT CAROTID SYSTEM: No dissection, occlusion or aneurysm. Mild atherosclerotic calcification at the carotid bifurcation without hemodynamically significant stenosis. LEFT CAROTID SYSTEM: No dissection, occlusion or aneurysm. Mild  atherosclerotic calcification at the carotid bifurcation without hemodynamically significant stenosis. VERTEBRAL ARTERIES: Right dominant configuration. The left vertebral artery is non-opacified at multiple locations of the V1 segment and proximal V2 segment. The distal V4 segment is also poorly opacified, but this might be partially due to poor contrast bolus. There is multifocal mild atherosclerosis of the right vertebral artery. CTA HEAD FINDINGS POSTERIOR CIRCULATION: --Vertebral arteries: Non opacification of the distal V4 segment of the left vertebral artery. Moderate atherosclerotic narrowing of the right V4 segment. --Posterior inferior cerebellar arteries (PICA): Patent origins from the vertebral arteries. --Anterior inferior cerebellar arteries (AICA): Patent origins from the basilar artery. --Basilar artery: Normal. --Superior cerebellar arteries: Normal. --Posterior cerebral arteries: Normal. Both originate from the basilar artery. Posterior communicating arteries (p-comm) are diminutive or absent. ANTERIOR CIRCULATION: --Intracranial internal carotid arteries: Normal. --Anterior cerebral arteries (ACA): Normal. Both A1 segments are present. Patent anterior communicating artery (a-comm). --Middle cerebral arteries (MCA): Normal. VENOUS SINUSES: As permitted by contrast timing, patent. ANATOMIC VARIANTS: None Review of the MIP images confirms the above findings. CT Brain Perfusion Findings: ASPECTS: 10 CBF (<30%) Volume: 0mL Perfusion (Tmax>6.0s) volume: 6mL Mismatch Volume: 6mL Infarction Location:None Note: The calculated area of ischemia is likely artifactual and appears to correspond to the CSF space in the posterior fossa. IMPRESSION: 1. No emergent large vessel occlusion or high-grade stenosis of the intracranial arteries. 2. Occlusion of the left vertebral artery at multiple locations of the V1 segment and proximal V2 segment, with poor opacification of the distal V4 segment. This is likely  secondary to atherosclerotic disease and exaggerated by a relatively poor contrast bolus. 3. No acute infarct by CT perfusion analysis. Small region of calculated penumbra in the left posterior fossa is considered artifactual. 4. Aortic Atherosclerosis (ICD10-I70.0). Electronically Signed   By: Deatra RobinsonKevin  Herman M.D.   On: 10/26/2019 03:23   ECHOCARDIOGRAM COMPLETE  Result Date: 10/27/2019    ECHOCARDIOGRAM REPORT   Patient Name:   Jordan Chang Date of Exam: 10/26/2019 Medical Rec #:  [id number]       Height:       71.0 in Accession #:    [id number]      Weight:       283.3 lb Date of Birth:  10/08/1940      BSA:          2.445 m Patient Age:  78 years        BP:           142/75 mmHg Patient Gender: M               HR:           92 bpm. Exam Location:  [facility] Procedure: 2D Echo and Intracardiac Opacification Agent Indications:     Stroke 434.91/ I163.9  History:         Patient has no prior history of Echocardiogram examinations.  Sonographer:     Wonda Cerise RDCS Referring Phys:  [id number] Andris Baumann Diagnosing Phys: Harold Hedge MD  Sonographer Comments: Technically challenging study due to limited acoustic windows, Technically difficult study due to poor echo windows and patient is morbidly obese. Image acquisition challenging due to patient body habitus. IMPRESSIONS  1. Left ventricular ejection fraction, by estimation, is 60 to 65%. The left ventricle has normal function. The left ventricle has no regional wall motion abnormalities. There is mild left ventricular hypertrophy. Left ventricular diastolic parameters are consistent with Grade I diastolic dysfunction (impaired relaxation).  2. Right ventricular systolic function is normal. The right ventricular size is normal.  3. The mitral valve is degenerative. Trivial mitral valve regurgitation. No evidence of mitral stenosis.  4. The aortic valve was not well visualized. Aortic valve regurgitation is trivial.  5. Aortic dilatation noted. FINDINGS  Left  Ventricle: Left ventricular ejection fraction, by estimation, is 60 to 65%. The left ventricle has normal function. The left ventricle has no regional wall motion abnormalities. Definity contrast agent was given IV to delineate the left ventricular  endocardial borders. The left ventricular internal cavity size was normal in size. There is mild left ventricular hypertrophy. Left ventricular diastolic parameters are consistent with Grade I diastolic dysfunction (impaired relaxation). Right Ventricle: The right ventricular size is normal. No increase in right ventricular wall thickness. Right ventricular systolic function is normal. Left Atrium: Left atrial size was normal in size. Right Atrium: Right atrial size was normal in size. Pericardium: There is no evidence of pericardial effusion. Mitral Valve: The mitral valve is degenerative in appearance. Trivial mitral valve regurgitation. No evidence of mitral valve stenosis. MV peak gradient, 11.3 mmHg. The mean mitral valve gradient is 5.0 mmHg. Tricuspid Valve: The tricuspid valve is not well visualized. Tricuspid valve regurgitation is trivial. Aortic Valve: The aortic valve was not well visualized. Aortic valve regurgitation is trivial. Aortic valve peak gradient measures 5.1 mmHg. Pulmonic Valve: The pulmonic valve was not well visualized. Pulmonic valve regurgitation is not visualized. Aorta: Aortic dilatation noted. IAS/Shunts: The interatrial septum was not assessed.  LEFT VENTRICLE PLAX 2D LVIDd:         3.55 cm      Diastology LVIDs:         2.51 cm      LV e' lateral:   8.27 cm/s LV PW:         1.19 cm      LV E/e' lateral: 14.8 LV IVS:        1.58 cm      LV e' medial:    3.81 cm/s LVOT diam:     2.20 cm      LV E/e' medial:  32.0 LV SV:         55 LV SV Index:   22 LVOT Area:     3.80 cm  LV Volumes (MOD) LV vol d, MOD A2C: 96.3 ml LV vol d, MOD A4C:  133.0 ml LV vol s, MOD A2C: 38.5 ml LV vol s, MOD A4C: 41.5 ml LV SV MOD A2C:     57.8 ml LV SV MOD A4C:      133.0 ml LV SV MOD BP:      75.9 ml RIGHT VENTRICLE RV Basal diam:  3.02 cm RV S prime:     10.40 cm/s TAPSE (M-mode): 1.9 cm LEFT ATRIUM             Index LA diam:        3.60 cm 1.47 cm/m LA Vol (A2C):   24.0 ml 9.82 ml/m LA Vol (A4C):   59.6 ml 24.38 ml/m LA Biplane Vol: 40.5 ml 16.57 ml/m  AORTIC VALVE AV Area (Vmax): 2.58 cm AV Vmax:        113.00 cm/s AV Peak Grad:   5.1 mmHg LVOT Vmax:      76.60 cm/s LVOT Vmean:     47.500 cm/s LVOT VTI:       0.144 m  AORTA Ao Root diam: 3.60 cm MITRAL VALVE MV Area (PHT): 4.21 cm     SHUNTS MV Peak grad:  11.3 mmHg    Systemic VTI:  0.14 m MV Mean grad:  5.0 mmHg     Systemic Diam: 2.20 cm MV Vmax:       1.68 m/s MV Vmean:      99.9 cm/s MV Decel Time: 180 msec MV E velocity: 122.00 cm/s MV A velocity: 171.00 cm/s MV E/A ratio:  0.71 Harold Hedge MD Electronically signed by Harold Hedge MD Signature Date/Time: 10/27/2019/7:18:59 AM    Final    CT HEAD CODE STROKE WO CONTRAST`  Result Date: 10/26/2019 CLINICAL DATA:  Code stroke.  Slurred speech and right side weakness EXAM: CT HEAD WITHOUT CONTRAST TECHNIQUE: Contiguous axial images were obtained from the base of the skull through the vertex without intravenous contrast. COMPARISON:  None. FINDINGS: Brain: There is no mass, hemorrhage or extra-axial collection. The size and configuration of the ventricles and extra-axial CSF spaces are normal. There is hypoattenuation of the periventricular white matter, most commonly indicating chronic ischemic microangiopathy. Old right cerebellar infarct. Vascular: Atherosclerotic calcification of the vertebral and internal carotid arteries at the skull base. No abnormal hyperdensity of the major intracranial arteries or dural venous sinuses. Skull: The visualized skull base, calvarium and extracranial soft tissues are normal. Sinuses/Orbits: No fluid levels or advanced mucosal thickening of the visualized paranasal sinuses. No mastoid or middle ear effusion. The orbits are  normal. ASPECTS [facility] Stroke Program Early CT Score) - Ganglionic level infarction (caudate, lentiform nuclei, internal capsule, insula, M1-M3 cortex): 7 - Supraganglionic infarction (M4-M6 cortex): 3 Total score (0-10 with 10 being normal): 10 IMPRESSION: 1. No acute intracranial abnormality. 2. ASPECTS is 10. 3. 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 * These results were called by telephone at the time of interpretation on 10/26/2019 at 2:14 am to provider [facility] , who verbally acknowledged these results. Electronically Signed   By: Deatra Robinson M.D.   On: 10/26/2019 02:14     Assessment/Plan:  79 y.o. male with hx of  hypertension, diabetes, neuropathy, chronic back pain who was brought to the emergency room with  onset slurred speech with weakness on the right leg on standing and weakness right arm as noticed by his wife along with R facial droop.  Symptoms started at 8:30 PM the day prior to arrival. Improved prior to arrival. [facility] no acute abnormalities. CTA no  LVO.  MRI with small subacute infarcts in multiple territories.  Took 162mg  of ASA daily.   - MRI as above - on ASA and Plavix - on lipitor - appreciate PT assistance - likely CIR today   10/27/2019, 11:06 AM

## 2019-10-27 NOTE — Evaluation (Signed)
Speech Language Pathology Evaluation Patient Details Name: Jordan Chang MRN: [id number] DOB: 1940-07-31 Today's Date: 10/27/2019 Time: 7829-5621 SLP Time Calculation (min) (ACUTE ONLY): 61 min  Problem List:  Patient Active Problem List   Diagnosis Date Noted  . Acute CVA (cerebrovascular accident) (HCC) 10/26/2019  . Type 2 diabetes mellitus with other specified complication (HCC) 10/26/2019  . Essential hypertension 10/26/2019  . Stroke (cerebrum) (HCC) 10/26/2019  . Stroke (HCC) 10/26/2019  . Chronic back pain 01/22/2016  . Lumbar herniated disc 01/20/2016   Past Medical History:  Past Medical History:  Diagnosis Date  . GERD (gastroesophageal reflux disease)    occ  . Hyperlipemia   . Hypertension    Past Surgical History:  Past Surgical History:  Procedure Laterality Date  . BACK SURGERY  05/2011  . EYE SURGERY     cataracts  . LUMBAR LAMINECTOMY/DECOMPRESSION MICRODISCECTOMY Left 01/20/2016   Procedure: Left Lumbar two-three, Lumbar three-four,  Lumbar four-five Microdiskectomy;  Surgeon: Tressie Stalker, MD;  Location: [facility];  Service: Neurosurgery;  Laterality: Left;   HPI:  Pt is an 79 y.o. male presenting to hospital 5/2 with slurred speech, R sided weakness, and facial droop; also slight HA and double vision.  Pt admitted with acute CVA (MRI of brain showing few small acute to subacute infarcts involving multiple vascular territories).  PMH includes htn, DM, neuropathy, chronic back pain, h/o back surgery.   Assessment / Plan / Recommendation Clinical Impression  Pt presents with mild to moderate flaccid dysarthria d/t right sided facial weakness. Pt's speech is c/b decreased articulatory precise and mildly hyponasal speech. His speech intelligibility is reduced to ~ 80% at the sentence level but is ~ 50% at the simple conversation level (usually when context is unknown to listener). Pt is very HOH but his expressive and receptive language abilities and  cognition appear intact. Recommend pt recieved ST services at next venue of care. At this time, pt is an excellent candidate for [facility].      SLP Assessment  SLP Recommendation/Assessment: All further Speech Lanaguage Pathology  needs can be addressed in the next venue of care SLP Visit Diagnosis: Dysarthria and anarthria (R47.1)    Follow Up Recommendations  Inpatient Rehab    Frequency and Duration   N/A        SLP Evaluation Cognition  Overall Cognitive Status: Within Functional Limits for tasks assessed Arousal/Alertness: Awake/alert Orientation Level: Oriented X4       Comprehension  Auditory Comprehension Overall Auditory Comprehension: Appears within functional limits for tasks assessed(Pt is very HOH) Visual Recognition/Discrimination Discrimination: Within Function Limits Reading Comprehension Reading Status: Not tested    Expression Expression Primary Mode of Expression: Verbal Verbal Expression Overall Verbal Expression: Appears within functional limits for tasks assessed Written Expression Dominant Hand: Right Written Expression: Not tested   Oral / Motor  Oral Motor/Sensory Function Overall Oral Motor/Sensory Function: Moderate impairment Facial ROM: Reduced right Facial Symmetry: Abnormal symmetry right Facial Strength: Reduced right Lingual ROM: Reduced right Lingual Symmetry: Abnormal symmetry right Lingual Strength: Reduced Motor Speech Overall Motor Speech: Impaired Respiration: Within functional limits Phonation: Normal Resonance: Hyponasality Articulation: Impaired Level of Impairment: Sentence Intelligibility: Intelligibility reduced Word: 75-100% accurate Phrase: 75-100% accurate Sentence: 50-74% accurate Conversation: 50-74% accurate Motor Planning: Witnin functional limits Motor Speech Errors: Not applicable Effective Techniques: Slow rate;Increased vocal intensity;Over-articulate   GO          Jordan Chang,  M.S., CCC-SLP, CBIS Speech-Language Pathologist Rehabilitation Services Office  [place] 10/27/2019, 1:48 PM

## 2019-10-27 NOTE — Progress Notes (Signed)
Plan of care reviewed with pt. Medication education provided; patient voiced understanding; continued stroke scale performed by Gearldine Bienenstock, Charity fundraiser. Denies pain; safety precautions maintained; call bell within reach.

## 2019-10-27 NOTE — Progress Notes (Signed)
Inpatient Rehabilitation Admissions Coordinator  I met with patient, his wife and daughter at bedside for his rehab assessment. He was working with therapy . I discussed goals and expectations of an inpt rehab admit. They prefer CIR in [place] at [facility] than SNF. I have begun authorization with [employer] and will follow up in the morning. Please call me with any questions.  Danne Baxter, RN, MSN Rehab Admissions Coordinator [phone] 10/27/2019 2:31 PM

## 2019-10-27 NOTE — Progress Notes (Signed)
I spoke with patient's wife by phone at patient's bedside. We have arranged to meet today at 2 pm upon arrival of patient's daughter, Claris Che, patient and wife for rehab assessment. I have alerted acute Team, Dr. Gonzella Lex and [facility] team. Please call me with any questions.  Ottie Glazier, RN, MSN Rehab Admissions Coordinator [phone] 10/27/2019 10:24 AM

## 2019-10-28 ENCOUNTER — Other Ambulatory Visit: Payer: Self-pay

## 2019-10-28 ENCOUNTER — Inpatient Hospital Stay (HOSPITAL_COMMUNITY)
Admission: RE | Admit: 2019-10-28 | Discharge: 2019-11-13 | DRG: 057 | Disposition: A | Discharge status: Home Health | Payer: PPO | Source: Other Acute Inpatient Hospital | Attending: Physical Medicine & Rehabilitation | Admitting: Physical Medicine & Rehabilitation

## 2019-10-28 DIAGNOSIS — Z6839 Body mass index (BMI) 39.0-39.9, adult: Secondary | ICD-10-CM | POA: Diagnosis not present

## 2019-10-28 DIAGNOSIS — R338 Other retention of urine: Secondary | ICD-10-CM | POA: Diagnosis present

## 2019-10-28 DIAGNOSIS — E1142 Type 2 diabetes mellitus with diabetic polyneuropathy: Secondary | ICD-10-CM | POA: Diagnosis present

## 2019-10-28 DIAGNOSIS — I69351 Hemiplegia and hemiparesis following cerebral infarction affecting right dominant side: Secondary | ICD-10-CM | POA: Diagnosis not present

## 2019-10-28 DIAGNOSIS — G8929 Other chronic pain: Secondary | ICD-10-CM | POA: Diagnosis present

## 2019-10-28 DIAGNOSIS — I69322 Dysarthria following cerebral infarction: Secondary | ICD-10-CM | POA: Diagnosis not present

## 2019-10-28 DIAGNOSIS — K219 Gastro-esophageal reflux disease without esophagitis: Secondary | ICD-10-CM | POA: Diagnosis present

## 2019-10-28 DIAGNOSIS — E1169 Type 2 diabetes mellitus with other specified complication: Secondary | ICD-10-CM | POA: Diagnosis not present

## 2019-10-28 DIAGNOSIS — M109 Gout, unspecified: Secondary | ICD-10-CM | POA: Diagnosis not present

## 2019-10-28 DIAGNOSIS — I10 Essential (primary) hypertension: Secondary | ICD-10-CM | POA: Diagnosis present

## 2019-10-28 DIAGNOSIS — R29810 Facial weakness: Secondary | ICD-10-CM | POA: Diagnosis not present

## 2019-10-28 DIAGNOSIS — N401 Enlarged prostate with lower urinary tract symptoms: Secondary | ICD-10-CM | POA: Diagnosis present

## 2019-10-28 DIAGNOSIS — E785 Hyperlipidemia, unspecified: Secondary | ICD-10-CM | POA: Diagnosis not present

## 2019-10-28 DIAGNOSIS — Z7982 Long term (current) use of aspirin: Secondary | ICD-10-CM

## 2019-10-28 DIAGNOSIS — Z888 Allergy status to other drugs, medicaments and biological substances status: Secondary | ICD-10-CM | POA: Diagnosis not present

## 2019-10-28 DIAGNOSIS — M549 Dorsalgia, unspecified: Secondary | ICD-10-CM | POA: Diagnosis present

## 2019-10-28 DIAGNOSIS — E782 Mixed hyperlipidemia: Secondary | ICD-10-CM

## 2019-10-28 DIAGNOSIS — I639 Cerebral infarction, unspecified: Secondary | ICD-10-CM | POA: Diagnosis present

## 2019-10-28 DIAGNOSIS — I6350 Cerebral infarction due to unspecified occlusion or stenosis of unspecified cerebral artery: Secondary | ICD-10-CM | POA: Diagnosis not present

## 2019-10-28 LAB — GLUCOSE, CAPILLARY
Glucose-Capillary: 115 mg/dL — ABNORMAL HIGH (ref 70–99)
Glucose-Capillary: 95 mg/dL (ref 70–99)
Glucose-Capillary: 126 mg/dL — ABNORMAL HIGH (ref 70–99)
Glucose-Capillary: 126 mg/dL — ABNORMAL HIGH (ref 70–99)

## 2019-10-28 MED ORDER — INSULIN ASPART 100 UNIT/ML ~~LOC~~ SOLN
0.0000 [IU] | Freq: Three times a day (TID) | SUBCUTANEOUS | Status: DC
  Administered 2019-10-28: 2 [IU] via SUBCUTANEOUS
  Administered 2019-10-29: 3 [IU] via SUBCUTANEOUS
  Administered 2019-10-29 – 2019-11-12 (×6): 2 [IU] via SUBCUTANEOUS

## 2019-10-28 MED ORDER — ACETAMINOPHEN 650 MG RE SUPP: 650.0000 mg | RECTAL | Status: DC | PRN

## 2019-10-28 MED ORDER — ENOXAPARIN SODIUM 40 MG/0.4ML ~~LOC~~ SOLN: 40.0000 mg | SUBCUTANEOUS | Status: DC

## 2019-10-28 MED ORDER — LORATADINE 10 MG PO TABS
10.0000 mg | ORAL_TABLET | Freq: Every day | ORAL | Status: DC
  Administered 2019-10-28 – 2019-11-12 (×15): 10 mg via ORAL
  Filled 2019-10-28 (×16): qty 1

## 2019-10-28 MED ORDER — CLOPIDOGREL BISULFATE 75 MG PO TABS
75.0000 mg | ORAL_TABLET | Freq: Every day | ORAL | Status: DC
  Administered 2019-10-29 – 2019-11-13 (×16): 75 mg via ORAL
  Filled 2019-10-28 (×16): qty 1

## 2019-10-28 MED ORDER — GABAPENTIN 300 MG PO CAPS
600.0000 mg | ORAL_CAPSULE | Freq: Three times a day (TID) | ORAL | Status: DC
  Administered 2019-10-28 – 2019-11-13 (×47): 600 mg via ORAL
  Filled 2019-10-28 (×48): qty 2

## 2019-10-28 MED ORDER — ASPIRIN EC 81 MG PO TBEC
81.0000 mg | DELAYED_RELEASE_TABLET | Freq: Every day | ORAL | Status: DC
  Administered 2019-10-29 – 2019-11-13 (×16): 81 mg via ORAL
  Filled 2019-10-28 (×16): qty 1

## 2019-10-28 MED ORDER — LIVING WELL WITH DIABETES BOOK
Freq: Once | Status: AC
  Filled 2019-10-28: qty 1

## 2019-10-28 MED ORDER — COLCHICINE 0.6 MG PO TABS
0.6000 mg | ORAL_TABLET | Freq: Two times a day (BID) | ORAL | Status: DC
  Administered 2019-10-28 – 2019-11-13 (×32): 0.6 mg via ORAL
  Filled 2019-10-28 (×32): qty 1

## 2019-10-28 MED ORDER — ACETAMINOPHEN 160 MG/5ML PO SOLN: 650.0000 mg | ORAL | Status: DC | PRN

## 2019-10-28 MED ORDER — SORBITOL 70 % SOLN
30.0000 mL | Freq: Every day | Status: DC | PRN
  Administered 2019-10-29 – 2019-11-12 (×3): 30 mL via ORAL
  Filled 2019-10-28 (×3): qty 30

## 2019-10-28 MED ORDER — BLOOD PRESSURE CONTROL BOOK
Freq: Once | Status: AC
  Filled 2019-10-28: qty 1

## 2019-10-28 MED ORDER — ENOXAPARIN SODIUM 40 MG/0.4ML ~~LOC~~ SOLN
40.0000 mg | SUBCUTANEOUS | Status: DC
  Administered 2019-10-29 – 2019-11-06 (×9): 40 mg via SUBCUTANEOUS
  Filled 2019-10-28 (×9): qty 0.4

## 2019-10-28 MED ORDER — CLOPIDOGREL BISULFATE 75 MG PO TABS: 75.0000 mg | ORAL_TABLET | Freq: Every day | ORAL | 0 refills | Status: DC

## 2019-10-28 MED ORDER — PREDNISONE 5 MG PO TABS
30.0000 mg | ORAL_TABLET | Freq: Every day | ORAL | Status: AC
  Administered 2019-10-29 – 2019-10-31 (×3): 30 mg via ORAL
  Filled 2019-10-28 (×3): qty 1

## 2019-10-28 MED ORDER — ATORVASTATIN CALCIUM 40 MG PO TABS: 40.0000 mg | ORAL_TABLET | ORAL | 0 refills | Status: DC

## 2019-10-28 MED ORDER — ACETAMINOPHEN 325 MG PO TABS
650.0000 mg | ORAL_TABLET | ORAL | Status: DC | PRN
  Administered 2019-10-28 – 2019-11-12 (×21): 650 mg via ORAL
  Filled 2019-10-28 (×21): qty 2

## 2019-10-28 MED ORDER — SENNOSIDES-DOCUSATE SODIUM 8.6-50 MG PO TABS: 1.0000 | ORAL_TABLET | Freq: Every evening | ORAL | Status: DC | PRN

## 2019-10-28 MED ORDER — CYCLOBENZAPRINE HCL 10 MG PO TABS
10.0000 mg | ORAL_TABLET | Freq: Three times a day (TID) | ORAL | Status: DC | PRN
  Administered 2019-11-02 – 2019-11-05 (×6): 10 mg via ORAL
  Filled 2019-10-28 (×6): qty 1

## 2019-10-28 MED ORDER — IBUPROFEN 200 MG PO TABS: 600.0000 mg | ORAL_TABLET | Freq: Three times a day (TID) | ORAL | 0 refills | Status: DC | PRN

## 2019-10-28 MED ORDER — ATORVASTATIN CALCIUM 40 MG PO TABS
40.0000 mg | ORAL_TABLET | ORAL | Status: DC
  Administered 2019-10-30 – 2019-11-11 (×7): 40 mg via ORAL
  Filled 2019-10-28 (×8): qty 1

## 2019-10-28 NOTE — TOC Initial Note (Signed)
Transition of Care [facility]) - Initial/Assessment Note    Patient Details  Name: Jordan Chang MRN: [id number] Date of Birth: May 25, 1941  Transition of Care [facility]) CM/SW Contact:    Allayne Butcher, RN Phone Number: 10/28/2019, 10:40 AM  Clinical Narrative:                 Patient has been accepted to CIR and is medically cleared for discharge today.  Patient will transport via [facility].  Patient's wife is at the bedside and aware of discharge plan.   Patient is from home with his wife and was very independent and active.  Patient would like to get back to his previous level of function.  CIR will call for report to the bedside RN.  CIR arranged transport with [facility] for [id number].    Expected Discharge Plan: IP Rehab Facility Barriers to Discharge: Barriers Resolved   Patient Goals and CMS Choice Patient states their goals for this hospitalization and ongoing recovery are:: To get back to previous level of function CMS Medicare.gov Compare Post Acute Care list provided to:: Patient Choice offered to / list presented to : Patient  Expected Discharge Plan and Services Expected Discharge Plan: IP Rehab Facility   Discharge Planning Services: CM Consult Post Acute Care Choice: IP Rehab Living arrangements for the past 2 months: Single Family Home                                      Prior Living Arrangements/Services Living arrangements for the past 2 months: Single Family Home Lives with:: Spouse Patient language and need for interpreter reviewed:: Yes Do you feel safe going back to the place where you live?: Yes      Need for Family Participation in Patient Care: Yes (Comment)(stroke) Care giver support system in place?: Yes (comment)(wife)   Criminal Activity/Legal Involvement Pertinent to Current Situation/Hospitalization: No - Comment as needed  Activities of Daily Living Home Assistive Devices/Equipment: Cane (specify quad or straight) ADL Screening (condition at  time of admission) Patient's cognitive ability adequate to safely complete daily activities?: Yes Is the patient deaf or have difficulty hearing?: Yes Does the patient have difficulty seeing, even when wearing glasses/contacts?: No Does the patient have difficulty concentrating, remembering, or making decisions?: No Patient able to express need for assistance with ADLs?: Yes Does the patient have difficulty dressing or bathing?: No Independently performs ADLs?: Yes (appropriate for developmental age) Does the patient have difficulty walking or climbing stairs?: No Weakness of Legs: None Weakness of Arms/Hands: None  Permission Sought/Granted Permission sought to share information with : Case Manager, Magazine features editor, Family Supports Permission granted to share information with : Yes, Verbal Permission Granted     Permission granted to share info w AGENCY: [facility]  Permission granted to share info w Relationship: wife     Emotional Assessment Appearance:: Appears stated age Attitude/Demeanor/Rapport: Engaged Affect (typically observed): Accepting Orientation: : Oriented to Self, Oriented to Place, Oriented to  Time, Oriented to Situation Alcohol / Substance Use: Not Applicable Psych Involvement: No (comment)  Admission diagnosis:  Pain [R52] Stroke (cerebrum) (HCC) [I63.9] Right sided weakness [R53.1] Stroke [facility]) [I63.9] Patient Active Problem List   Diagnosis Date Noted  . Acute CVA (cerebrovascular accident) (HCC) 10/26/2019  . Type 2 diabetes mellitus with other specified complication (HCC) 10/26/2019  . Essential hypertension 10/26/2019  . Stroke (cerebrum) (HCC) 10/26/2019  .  Stroke ([place]) 10/26/2019  . Chronic back pain 01/22/2016  . Lumbar herniated disc 01/20/2016   PCP:  Tracie Harrier, MD Pharmacy:   [facility], [place] [address] Phone: [phone] Fax:  [phone]     Social Determinants of Health (SDOH) Interventions    Readmission Risk Interventions No flowsheet data found.

## 2019-10-28 NOTE — Progress Notes (Signed)
Courtney Heys, MD  Physician  Physical Medicine and Rehabilitation  PMR Pre-admission      Signed  Date of Service:  10/28/2019  8:47 AM      Related encounter: ED to Hosp-Admission (Discharged) from 10/26/2019 in [place] (1C)      Signed        Show:Clear all [x] Manual[x] Template[x] Copied  Added by: [x] Mariapaula Krist, Vertis Kelch, RN[x] Courtney Heys, MD  [] Hover for details PMR Admission Coordinator Pre-Admission Assessment   Patient: Jordan Chang is an 79 y.o., male MRN: [id number] DOB: 10-05-1940 Height: 5' 11"  (180.3 cm) Weight: 128.5 kg   Insurance Information HMO:     PPO: yes     PCP:      IPA:      80/20:      OTHER:  PRIMARY: [employer]      Policy#: [id number]      Subscriber: pt CM Name: Marlowe Kays   Phone#: [phone]   Fax#: [phone] Have Epic access Pre-Cert#: [id number] approved for 7 days      Employer:  Benefits:  Phone #: [phone]     Name: 5/3 Eff. Date: 06/27/2019     Deduct: none      Out of Pocket Max: $3400      Life Max: none CIR: $295 co pay per day days 1 until 6      SNF: $50 co pay per day days 1 until 20: $178 per day days 21 until 100 Outpatient: $15 per visit     Co-Pay: visits limited by medical neccesity Home Health: 100%      Co-Pay: visits per medical neccesity DME: 80%     Co-Pay: 20% Providers: in network  SECONDARY: none         The "Data Collection Information Summary" for patients in Inpatient Rehabilitation Facilities with attached "Privacy Act [place] Records" was provided and verbally reviewed with: Patient and Family   Emergency Contact Information         Contact Information     Name Relation Home Work Mobile    Tina Spouse [phone]   [place] Daughter [phone]   [phone]    Jonita Albee   [phone]        Cristi Loron   [phone]             Current Medical History  Patient Admitting Diagnosis: CVA   History of  Present Illness: 79 year old male with medical history for hypertension. Diabetes, Neuropathy, chronic back pain with lumbar laminectomy and decompression 2017. Presented on 10/26/2019 to emergency room with onset slurred speech with weakness on the right leg on standing and weakness in right arm with droop on right side of face. Symptoms began 8:30 pm the night before preceded by a headache and double vision.    Initial CT scan showed no acute findings. Tele neurology called but did consult as patient was out of TPA window. CTA head and neck showed no emergent LVO or high grade stenosis of the intracranial arteries and no acute infarct by CT perfusion analysis. Blood pressure mildly elevated. Allow permissive blood pressure.    MRI brain showing triple small acute to subacute infarct involving bilateral brain parenchyma (left pons, inferior right cerebellum, left frontal lobe and right parietal lobe).  Also shows chronic microvascular ischemic changes and small chronic right cerebellar infarct. Suspect embolic stroke based on findings.  2D echo with normal EF and no wall motion abnormality or cardiac source of emboli.  LDL of 130.  A1c of 6.6.  Continue on Lipitor 80 mg daily.  Patient was already on baby aspirin and Plavix was added. Outpatient Holter monitoring.    Type 2 Diabetes with Hgb A1c 6.6. Monitor on sliding scale and uses metformin at home. SQ Lovenox for DVT prophylaxis.    Complete NIHSS TOTAL: 3   Patient's medical record from [facility]  has been reviewed by the rehabilitation admission coordinator and physician.   Past Medical History      Past Medical History:  Diagnosis Date  . GERD (gastroesophageal reflux disease)      occ  . Hyperlipemia    . Hypertension        Family History   family history is not on file.   Prior Rehab/Hospitalizations Has the patient had prior rehab or hospitalizations prior to admission? Yes   Has the patient had major surgery  during 100 days prior to admission? No               Current Medications   Current Facility-Administered Medications:  .   stroke: mapping our early stages of recovery book, , Does not apply, Once, Athena Masse, MD .  acetaminophen (TYLENOL) tablet 650 mg, 650 mg, Oral, Q4H PRN, 650 mg at 10/28/19 0258 **OR** acetaminophen (TYLENOL) 160 MG/5ML solution 650 mg, 650 mg, Per Tube, Q4H PRN **OR** acetaminophen (TYLENOL) suppository 650 mg, 650 mg, Rectal, Q4H PRN, Athena Masse, MD .  aspirin EC tablet 81 mg, 81 mg, Oral, Daily, Athena Masse, MD, 81 mg at 10/28/19 0933 .  aspirin suppository 300 mg, 300 mg, Rectal, Once, Judd Gaudier V, MD .  atorvastatin (LIPITOR) tablet 40 mg, 40 mg, Oral, QODAY, Athena Masse, MD, 40 mg at 10/28/19 0933 .  clopidogrel (PLAVIX) tablet 75 mg, 75 mg, Oral, Daily, Athena Masse, MD, 75 mg at 10/28/19 0933 .  cyclobenzaprine (FLEXERIL) tablet 10 mg, 10 mg, Oral, TID PRN, Dhungel, Nishant, MD, 10 mg at 10/28/19 7829 .  docusate sodium (COLACE) capsule 100 mg, 100 mg, Oral, BID, Dhungel, Nishant, MD, 100 mg at 10/28/19 0933 .  enoxaparin (LOVENOX) injection 40 mg, 40 mg, Subcutaneous, Q24H, Athena Masse, MD, 40 mg at 10/28/19 0933 .  gabapentin (NEURONTIN) capsule 600 mg, 600 mg, Oral, TID, Dhungel, Nishant, MD, 600 mg at 10/28/19 0933 .  insulin aspart (novoLOG) injection 0-15 Units, 0-15 Units, Subcutaneous, TID WC, Athena Masse, MD, 2 Units at 10/27/19 1155 .  insulin aspart (novoLOG) injection 0-5 Units, 0-5 Units, Subcutaneous, QHS, Judd Gaudier V, MD .  loratadine (CLARITIN) tablet 10 mg, 10 mg, Oral, Daily, Dhungel, Nishant, MD, 10 mg at 10/27/19 2117 .  senna-docusate (Senokot-S) tablet 1 tablet, 1 tablet, Oral, QHS PRN, Athena Masse, MD   Patients Current Diet:     Diet Order                      Diet heart healthy/carb modified Room service appropriate? Yes; Fluid consistency: Thin  Diet effective now                     Precautions / Restrictions Precautions Precautions: Fall Precaution Comments: Aspiration Restrictions Weight Bearing Restrictions: No    Has the patient had 2 or more falls or a fall with injury in the past year? No   Prior Activity Level Community (5-7x/wk): Independent with SPC prn; driving; yard work, retired Financial controller  Level Self Care: Did the patient need help bathing, dressing, using the toilet or eating? Independent   Indoor Mobility: Did the patient need assistance with walking from room to room (with or without device)? Independent   Stairs: Did the patient need assistance with internal or external stairs (with or without device)? Independent   Functional Cognition: Did the patient need help planning regular tasks such as shopping or remembering to take medications? Independent   Home Assistive Devices / Equipment Home Assistive Devices/Equipment: Cane (specify quad or straight) Home Equipment: Cane - single point   Prior Device Use: Indicate devices/aids used by the patient prior to current illness, exacerbation or injury? SPC   Current Functional Level Cognition   Arousal/Alertness: Awake/alert Overall Cognitive Status: Within Functional Limits for tasks assessed Orientation Level: Oriented X4 General Comments: Pt joking around during session.    Extremity Assessment (includes Sensation/Coordination)   Upper Extremity Assessment: Defer to OT evaluation RUE Deficits / Details: Per OT eval: "R grip 3+/5. R shoulder AROM ~90*. Intact 5 finger opposition c increased time" RUE Sensation: (Pt reports paresthesias have resolved since last night) RUE Coordination: decreased fine motor  Lower Extremity Assessment: RLE deficits/detail(L LE WFL) RLE Deficits / Details: hip flexion 3-/5; knee flexion 3+/5; knee extension 3-/5; DF at least 3/5 AROM(Intact proprioception, tone, and light touch) RLE Coordination: decreased gross motor     ADLs   Overall  ADL's : Needs assistance/impaired General ADL Comments: MOD A don/doff R sock seated EOB, CGA don/doff L sock. SETUP unilateral grooming tasks seated EOB.      Mobility   Overal bed mobility: Needs Assistance Bed Mobility: Supine to Sit Supine to sit: Mod assist, HOB elevated Sit to supine: Min assist General bed mobility comments: pt attempted to perform on own (towards L side of bed) but unable so therapist provided mod assist for trunk semi-supine to sitting edge of bed; vc's for technique     Transfers   Overall transfer level: Needs assistance Equipment used: Rolling walker (2 wheeled) Transfers: Sit to/from Stand Sit to Stand: Min guard, Min assist, Mod assist, +2 physical assistance General transfer comment: pt attempted to stand from bed on his own but unable even with vc's; mod assist x1 plus CGA of 2nd to stand from bed; CGA to min assist x2 to stand from recliner; vc's and assist to place R hand properly on walker     Ambulation / Gait / Stairs / Wheelchair Mobility   Ambulation/Gait Ambulation/Gait assistance: Min assist, Mod assist, +2 physical assistance Gait Distance (Feet): 38 Feet Assistive device: Rolling walker (2 wheeled) General Gait Details: pt initally with narrow BOS requiring cueing to increase BOS; mild R knee flexion noted during R LE stance phase; decreased R LE step length noted with increased distance ambulating; pt with R lean during ambulation requiring min to mod assist to maintain upright posture (vc's to shift weight towards L side); assist to steady towards end of ambulation Gait velocity: decreased     Posture / Balance Dynamic Sitting Balance Sitting balance - Comments: steady sitting reaching within BOS Balance Overall balance assessment: Needs assistance Sitting-balance support: No upper extremity supported, Feet supported Sitting balance-Leahy Scale: Good Sitting balance - Comments: steady sitting reaching within BOS Standing balance support:  Single extremity supported Standing balance-Leahy Scale: Poor Standing balance comment: pt requiring at least L UE support for static standing balance     Special needs/care consideration Diabetic Management Hgb A1c 6.6 Designated visitors are wife, Arbie Cookey and daughter,  Margaret Patient is [facility]; hears best left ear and likes to read lips    Previous Home Environment  Living Arrangements: Spouse/significant other  Lives With: Spouse Available Help at Discharge: Family, Available 24 hours/day Type of Home: House Home Layout: One level Home Access: Stairs to enter Entrance Stairs-Rails: Right Entrance Stairs-Number of Steps: 3 (from car-port) Bathroom Shower/Tub: Multimedia programmer: Standard Bathroom Accessibility: Yes How Accessible: Accessible via walker McCoy: No   Discharge Living Setting Plans for Discharge Living Setting: Patient's home, Lives with (comment)(spouse) Type of Home at Discharge: House Discharge Home Layout: One level Discharge Home Access: Stairs to enter Entrance Stairs-Rails: Right Entrance Stairs-Number of Steps: 3 from [place] Discharge Bathroom Shower/Tub: Walk-in shower Discharge Bathroom Toilet: Standard Discharge Bathroom Accessibility: Yes How Accessible: Accessible via walker Does the patient have any problems obtaining your medications?: No   Social/Family/Support Systems Patient Roles: Spouse, Parent(has 3 adult children local) Contact Information: wife and daughter Anticipated Caregiver: wife and daughter and daughter's family Anticipated Caregiver's Contact Information: see above Ability/Limitations of Caregiver: wife can supervise with light min assist for adls; daughter local but she and her family can assist prn Caregiver Availability: 24/7 Discharge Plan Discussed with Primary Caregiver: Yes Is Caregiver In Agreement with Plan?: Yes Does Caregiver/Family have Issues with Lodging/Transportation while Pt is in  Rehab?: No   Goals Patient/Family Goal for Rehab: Mod I to supervision PT, superivison to min OT, Mod I to supervision with SLP Expected length of stay: ELOS 10 to 14 days Additional Information: Very HOH; hears best left ear; like to read lips Pt/Family Agrees to Admission and willing to participate: Yes Program Orientation Provided & Reviewed with Pt/Caregiver Including Roles  & Responsibilities: Yes   Decrease burden of Care through IP rehab admission: n/a   Possible need for SNF placement upon discharge: not anticipated   Patient Condition: I have reviewed medical records from [facility] , spoken with CM, and patient, spouse and daughter. I met with patient at the bedside for inpatient rehabilitation assessment.  Patient will benefit from ongoing PT, OT and SLP, can actively participate in 3 hours of therapy a day 5 days of the week, and can make measurable gains during the admission.  Patient will also benefit from the coordinated team approach during an Inpatient Acute Rehabilitation admission.  The patient will receive intensive therapy as well as Rehabilitation physician, nursing, social worker, and care management interventions.  Due to bladder management, bowel management, safety, skin/wound care, disease management, medication administration, pain management and patient education the patient requires 24 hour a day rehabilitation nursing.  The patient is currently min to mod assist with mobility and basic ADLs.  Discharge setting and therapy post discharge at home with home health is anticipated.  Patient has agreed to participate in the Acute Inpatient Rehabilitation Program and will admit today.   Preadmission Screen Completed By:  Cleatrice Burke, 10/28/2019 9:57 AM ______________________________________________________________________   Discussed status with Dr. Dagoberto Ligas on  10/28/2019 at  0957 and received approval for admission today.   Admission Coordinator:   Cleatrice Burke, RN, time 3220 Date  10/28/2019    Assessment/Plan: Diagnosis: B/L embolic Strokes with R hemiparesis and slurred speech 1. Does the need for close, 24 hr/day Medical supervision in concert with the patient's rehab needs make it unreasonable for this patient to be served in a less intensive setting? Yes 2. Co-Morbidities requiring supervision/potential complications: HTN, DM, chronic back pain s/p laminectomy  3. Due  to bladder management, bowel management, safety, skin/wound care, disease management, medication administration, pain management and patient education, does the patient require 24 hr/day rehab nursing? Yes 4. Does the patient require coordinated care of a physician, rehab nurse, PT, OT, and SLP to address physical and functional deficits in the context of the above medical diagnosis(es)? Yes Addressing deficits in the following areas: balance, endurance, locomotion, strength, transferring, bathing, dressing, feeding, grooming, toileting, cognition, speech and psychosocial support 5. Can the patient actively participate in an intensive therapy program of at least 3 hrs of therapy 5 days a week? Yes 6. The potential for patient to make measurable gains while on inpatient rehab is good and fair 7. Anticipated functional outcomes upon discharge from inpatient rehab: modified independent and supervision PT, supervision and min assist OT, modified independent SLP 8. Estimated rehab length of stay to reach the above functional goals is: 10-14 days 9. Anticipated discharge destination: Home 10. Overall Rehab/Functional Prognosis: good and fair     MD Signature:          Revision History

## 2019-10-28 NOTE — H&P (Signed)
Physical Medicine and Rehabilitation Admission H&P    No chief complaint on file. : HPI: Jordan Chang is a 79 year old right-handed male with history of hypertension, morbid obesity with BMI 39.51, diabetes mellitus with neuropathy, chronic back pain with lumbar laminectomy decompression 01/20/2016, hyperlipidemia.  Per chart review patient lives with spouse.  Independent with a straight point cane.  1 level home 3 steps to entry.  Family assistance as needed.  Presented to [facility] 10/26/2019 with slurred speech and right-sided weakness.  Admission chemistries with alcohol negative, glucose 136, BUN 27, creatinine 1.20, urine drug screen negative.  Cranial CT scan negative for acute changes.  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  CT angiogram of head and neck no emergent large vessel occlusion or high-grade stenosis.  MRI of the brain showed few small acute to subacute infarcts multiple vascular territories.  Chronic microvascular ischemic changes.  Patient did not receive TPA.  Echocardiogram with ejection fraction of 65%.  Grade 1 diastolic dysfunction.  Lower extremity Doppler negative for DVT.  Currently on aspirin and Plavix for CVA prophylaxis.  Subcutaneous Lovenox for DVT prophylaxis.  Tolerating a regular diet.  Therapy evaluations completed and patient was admitted for a comprehensive rehab program.  Pt c/o gout Sx's in R foot/ankle- on top of foot- very painful since last night.  LBM Saturday- feels constipated.  Voiding OK.  Likes to talk a lot, per pt and wife.     Review of Systems  Constitutional: Negative for chills and fever.  HENT: Positive for hearing loss.   Eyes: Negative for blurred vision and double vision.  Respiratory: Negative for cough and shortness of breath.   Cardiovascular: Negative for chest pain, palpitations and leg swelling.  Gastrointestinal: Positive for constipation. Negative for heartburn and vomiting.       GERD    Genitourinary: Negative for dysuria, flank pain and hematuria.  Musculoskeletal: Positive for back pain and myalgias.  Skin: Negative for rash.  Neurological: Positive for speech change and weakness.  All other systems reviewed and are negative.  Past Medical History:  Diagnosis Date  . GERD (gastroesophageal reflux disease)    occ  . Hyperlipemia   . Hypertension    Past Surgical History:  Procedure Laterality Date  . BACK SURGERY  05/2011  . EYE SURGERY     cataracts  . LUMBAR LAMINECTOMY/DECOMPRESSION MICRODISCECTOMY Left 01/20/2016   Procedure: Left Lumbar two-three, Lumbar three-four,  Lumbar four-five Microdiskectomy;  Surgeon: Tressie Stalker, MD;  Location: [facility];  Service: Neurosurgery;  Laterality: Left;   No family history on file. Social History:  reports that he has never smoked. He has never used smokeless tobacco. He reports previous alcohol use. He reports that he does not use drugs. Allergies:  Allergies  Allergen Reactions  . Other Other (See Comments)    STEROIDS, not recorded, caused tachycardia Pt "does not know name"   Medications Prior to Admission  Medication Sig Dispense Refill  . aspirin EC 81 MG tablet Take 81 mg by mouth every morning.     Marland Kitchen atorvastatin (LIPITOR) 40 MG tablet Take 1 tablet (40 mg total) by mouth every other day. 15 tablet 0  . Cholecalciferol (D3-1000 PO) Take 1,000 mcg by mouth daily.     . Cholecalciferol (VITAMIN D-1000 MAX ST) 1000 units tablet Take 2,000 Units by mouth daily.    Melene Muller ON 10/29/2019] clopidogrel (PLAVIX) 75 MG tablet Take 1 tablet (75 mg total) by mouth daily. 30  tablet 0  . cyclobenzaprine (FLEXERIL) 10 MG tablet Take 1 tablet (10 mg total) by mouth 3 (three) times daily as needed for muscle spasms. 50 tablet 1  . docusate sodium (COLACE) 100 MG capsule Take 1 capsule (100 mg total) by mouth 2 (two) times daily. 60 capsule 0  . gabapentin (NEURONTIN) 300 MG capsule Take 600 mg by mouth 3 (three) times  daily.     Marland Kitchen ibuprofen (GOODSENSE IBUPROFEN) 200 MG tablet Take 3 tablets (600 mg total) by mouth every 8 (eight) hours as needed. 30 tablet 0  . losartan (COZAAR) 50 MG tablet Take 50 mg by mouth daily.    Marland Kitchen oxyCODONE-acetaminophen (PERCOCET/ROXICET) 5-325 MG tablet Take 1-2 tablets by mouth every 4 (four) hours as needed for moderate pain. 50 tablet 0  . PEPCID 20 MG tablet Take 20 mg by mouth daily as needed for heartburn or indigestion.     . triamterene-hydrochlorothiazide (MAXZIDE-25) 37.5-25 MG tablet Take 1 tablet by mouth daily.    . vitamin B-12 (CYANOCOBALAMIN) 1000 MCG tablet Take 1,000 mcg by mouth daily.      Drug Regimen Review Drug regimen was reviewed and remains appropriate with no significant issues identified  Home: Home Living Family/patient expects to be discharged to:: Private residence Living Arrangements: Spouse/significant other   Functional History:    Functional Status:  Mobility:          ADL:    Cognition: Cognition Orientation Level: Oriented X4    Physical Exam: Blood pressure (!) 160/77, pulse 80, temperature 98.6 F (37 C), resp. rate 17, height 5\' 11"  (1.803 m), weight 129.1 kg, SpO2 96 %. Physical Exam  Nursing note and vitals reviewed. Constitutional: He is oriented to person, place, and time. He appears well-developed and well-nourished.  Elderly male sitting in bed; HOH, wife at bedside for latter half of interview, NAD  HENT:  Head: Normocephalic and atraumatic.  Nose: Nose normal.  Mouth/Throat: Oropharynx is clear and moist. No oropharyngeal exudate.  Mild R facial droop Tongue midline with a little food on it Facial sensation intact B/L   Eyes: Conjunctivae and EOM are normal.  EOMI B/L No nystagmus  Neck: No tracheal deviation present.  Cardiovascular:  RRR_ no JVD  Respiratory: No stridor.  CTA B/L- except a little coarse at bases B/L- good air movement  GI:  Soft, but somewhat distended; NT, hypoactive BS    Musculoskeletal:     Cervical back: Normal range of motion and neck supple.     Comments:  R ankle, dorsum of foot is erythematous, very hot/TTP RUE- deltoid 3/5, biceps 4+/5, triceps 3-/5, WE 3-/5, grip 3-/5, finger abd 3-/5 LUE 5-/5 in same muscles RLE- HF 3-/5, KE/KF 3/5, DF 1/5 (pain inhibition?) PF 2/5 LLE_ 5-/5 in same muscles   Neurological: He is alert and oriented to person, place, and time.  Patient is alert.  Makes eye contact with examiner.  Speech is dysarthric but intelligible.  Follows simple commands. Slightly dysarthric Very talkative, slightly perseverative on gout Sx's Sensation intact to light touch in all 4 extremities   Skin: Skin is warm and dry.  Gout Sx's and signs as above L antecubital fossa- previous IV- is out- no redness  Psychiatric: He has a normal mood and affect. His behavior is normal.    Results for orders placed or performed during the hospital encounter of 10/28/19 (from the past 48 hour(s))  Glucose, capillary     Status: None   Collection Time: 10/28/19 12:49  PM  Result Value Ref Range   Glucose-Capillary 95 70 - 99 mg/dL    Comment: Glucose reference range applies only to samples taken after fasting for at least 8 hours.   No results found.     Medical Problem List and Plan: 1.  Right-sided weakness with dysarthria secondary to acute to subacute infarct multiple vascular territories involving bilateral brain parenchyma(left pons, inferior right cerebellum, left frontal lobe and right parietal lobe).  Small chronic right cerebellar infarct  -patient may  shower  -ELOS/Goals: 2- 2.5 weeks- goals Mod I to supervision 2.  Antithrombotics: -DVT/anticoagulation: Lovenox  -antiplatelet therapy: Aspirin 81 mg daily, Plavix 75 mg daily 3. Pain Management: Neurontin 600 mg 3 times daily, Flexeril as needed 4. Mood: Provide emotional support  -antipsychotic agents: N/A 5. Neuropsych: This patient is capable of making decisions on his own  behalf. 6. Skin/Wound Care: Routine skin checks 7. Fluids/Electrolytes/Nutrition: Routine in and outs with follow-up chemistries 8.  Diabetes mellitus with peripheral neuropathy.  Hemoglobin A1c 6.6.  SSI. 9.  Permissive hypertension.  Monitor with increased mobility.  Patient on Cozaar 50 mg daily, Maxide 37.5-25 mg daily prior to admission.  Resume as needed 10.  Hyperlipidemia.  Lipitor 11. Gout flare- pt has had before- will start Colchicine 0.6 mg BID and also Prednisone 30 mg x3days - since Dm, won't do longer- will allow Dr Wynn Banker to decide overall plan.    Mcarthur Rossetti. Anguilli, PA-C 10/28/2019  I have personally performed a face to face diagnostic evaluation of this patient and formulated the key components of the plan.  Additionally, I have personally reviewed laboratory data, imaging studies, as well as relevant notes and concur with the physician assistant's documentation above.   The patient's status has not changed from the original H&P.  Any changes in documentation from the acute care chart have been noted above.     Genice Rouge, MD 10/28/2019

## 2019-10-28 NOTE — TOC Transition Note (Signed)
Transition of Care [facility]) - CM/SW Discharge Note   Patient Details  Name: Jordan Chang MRN: [id number] Date of Birth: 1940/07/10  Transition of Care [facility]) CM/SW Contact:  Allayne Butcher, RN Phone Number: 10/28/2019, 11:31 AM   Clinical Narrative:    [name] is here to pick up patient for transport to CIR.     Final next level of care: IP Rehab Facility Barriers to Discharge: Barriers Resolved   Patient Goals and CMS Choice Patient states their goals for this hospitalization and ongoing recovery are:: To get back to previous level of function CMS Medicare.gov Compare Post Acute Care list provided to:: Patient Choice offered to / list presented to : Patient  Discharge Placement                       Discharge Plan and Services   Discharge Planning Services: CM Consult Post Acute Care Choice: IP Rehab                               Social Determinants of Health (SDOH) Interventions     Readmission Risk Interventions No flowsheet data found.

## 2019-10-28 NOTE — PMR Pre-admission (Signed)
PMR Admission Coordinator Pre-Admission Assessment  Patient: Jordan Chang is an 79 y.o., male MRN: [id number] DOB: [birth year] Height: 5' 11"  (180.3 cm) Weight: 128.5 kg  Insurance Information HMO:     PPO: yes     PCP:      IPA:      80/20:      OTHER:  PRIMARY: [employer]      Policy#: [id number]      Subscriber: pt CM Name: Marlowe Kays   Phone#: [phone]   Fax#: [phone] Have Epic access Pre-Cert#: [id number] approved for 7 days      Employer:  Benefits:  Phone #: [phone]     Name: 5/3 Eff. Date: 06/27/2019     Deduct: none      Out of Pocket Max: $3400      Life Max: none CIR: $295 co pay per day days 1 until 6      SNF: $50 co pay per day days 1 until 20: $178 per day days 21 until 100 Outpatient: $15 per visit     Co-Pay: visits limited by medical neccesity Home Health: 100%      Co-Pay: visits per medical neccesity DME: 80%     Co-Pay: 20% Providers: in network  SECONDARY: none        The "Data Collection Information Summary" for patients in Inpatient Rehabilitation Facilities with attached "Privacy Act [place] Records" was provided and verbally reviewed with: Patient and Family  Emergency Contact Information Contact Information    Name Relation Home Work Mobile   [place] Spouse [phone]  [place] Daughter [phone]  [phone]   Jonita Albee  [phone]     Cristi Loron  [phone]        Current Medical History  Patient Admitting Diagnosis: CVA  History of Present Illness: 79 year old male with medical history for hypertension. Diabetes, Neuropathy, chronic back pain with lumbar laminectomy and decompression 2017. Presented on 10/26/2019 to emergency room with onset slurred speech with weakness on the right leg on standing and weakness in right arm with droop on right side of face. Symptoms began 8:30 pm the night before preceded by a headache and double vision.   Initial CT scan showed no acute  findings. Tele neurology called but did consult as patient was out of TPA window. CTA head and neck showed no emergent LVO or high grade stenosis of the intracranial arteries and no acute infarct by CT perfusion analysis. Blood pressure mildly elevated. Allow permissive blood pressure.   MRI brain showing triple small acute to subacute infarct involving bilateral brain parenchyma (left pons, inferior right cerebellum, left frontal lobe and right parietal lobe).  Also shows chronic microvascular ischemic changes and small chronic right cerebellar infarct. Suspect embolic stroke based on findings.  2D echo with normal EF and no wall motion abnormality or cardiac source of emboli.  LDL of 130.  A1c of 6.6.  Continue on Lipitor 80 mg daily.  Patient was already on baby aspirin and Plavix was added. Outpatient Holter monitoring.   Type 2 Diabetes with Hgb A1c 6.6. Monitor on sliding scale and uses metformin at home. SQ Lovenox for DVT prophylaxis.   Complete NIHSS TOTAL: 3  Patient's medical record from [facility]  has been reviewed by the rehabilitation admission coordinator and physician.  Past Medical History  Past Medical History:  Diagnosis Date  . GERD (gastroesophageal reflux disease)    occ  . Hyperlipemia   . Hypertension  Family History   family history is not on file.  Prior Rehab/Hospitalizations Has the patient had prior rehab or hospitalizations prior to admission? Yes  Has the patient had major surgery during 100 days prior to admission? No   Current Medications  Current Facility-Administered Medications:  .   stroke: mapping our early stages of recovery book, , Does not apply, Once, Athena Masse, MD .  acetaminophen (TYLENOL) tablet 650 mg, 650 mg, Oral, Q4H PRN, 650 mg at 10/28/19 0258 **OR** acetaminophen (TYLENOL) 160 MG/5ML solution 650 mg, 650 mg, Per Tube, Q4H PRN **OR** acetaminophen (TYLENOL) suppository 650 mg, 650 mg, Rectal, Q4H PRN, Athena Masse, MD .  aspirin EC tablet 81 mg, 81 mg, Oral, Daily, Athena Masse, MD, 81 mg at 10/28/19 0933 .  aspirin suppository 300 mg, 300 mg, Rectal, Once, Judd Gaudier V, MD .  atorvastatin (LIPITOR) tablet 40 mg, 40 mg, Oral, QODAY, Athena Masse, MD, 40 mg at 10/28/19 0933 .  clopidogrel (PLAVIX) tablet 75 mg, 75 mg, Oral, Daily, Athena Masse, MD, 75 mg at 10/28/19 0933 .  cyclobenzaprine (FLEXERIL) tablet 10 mg, 10 mg, Oral, TID PRN, Dhungel, Nishant, MD, 10 mg at 10/28/19 3149 .  docusate sodium (COLACE) capsule 100 mg, 100 mg, Oral, BID, Dhungel, Nishant, MD, 100 mg at 10/28/19 0933 .  enoxaparin (LOVENOX) injection 40 mg, 40 mg, Subcutaneous, Q24H, Athena Masse, MD, 40 mg at 10/28/19 0933 .  gabapentin (NEURONTIN) capsule 600 mg, 600 mg, Oral, TID, Dhungel, Nishant, MD, 600 mg at 10/28/19 0933 .  insulin aspart (novoLOG) injection 0-15 Units, 0-15 Units, Subcutaneous, TID WC, Athena Masse, MD, 2 Units at 10/27/19 1155 .  insulin aspart (novoLOG) injection 0-5 Units, 0-5 Units, Subcutaneous, QHS, Judd Gaudier V, MD .  loratadine (CLARITIN) tablet 10 mg, 10 mg, Oral, Daily, Dhungel, Nishant, MD, 10 mg at 10/27/19 2117 .  senna-docusate (Senokot-S) tablet 1 tablet, 1 tablet, Oral, QHS PRN, Athena Masse, MD  Patients Current Diet:  Diet Order            Diet heart healthy/carb modified Room service appropriate? Yes; Fluid consistency: Thin  Diet effective now              Precautions / Restrictions Precautions Precautions: Fall Precaution Comments: Aspiration Restrictions Weight Bearing Restrictions: No   Has the patient had 2 or more falls or a fall with injury in the past year? No  Prior Activity Level Community (5-7x/wk): Independent with SPC prn; driving; yard work, retired Financial controller Level Self Care: Did the patient need help bathing, dressing, using the toilet or eating? Independent  Indoor Mobility: Did the patient need assistance with  walking from room to room (with or without device)? Independent  Stairs: Did the patient need assistance with internal or external stairs (with or without device)? Independent  Functional Cognition: Did the patient need help planning regular tasks such as shopping or remembering to take medications? Independent  Home Assistive Devices / Equipment Home Assistive Devices/Equipment: Cane (specify quad or straight) Home Equipment: Cane - single point  Prior Device Use: Indicate devices/aids used by the patient prior to current illness, exacerbation or injury? SPC  Current Functional Level Cognition  Arousal/Alertness: Awake/alert Overall Cognitive Status: Within Functional Limits for tasks assessed Orientation Level: Oriented X4 General Comments: Pt joking around during session.    Extremity Assessment (includes Sensation/Coordination)  Upper Extremity Assessment: Defer to OT evaluation RUE Deficits / Details: Per OT eval: "  R grip 3+/5. R shoulder AROM ~90*. Intact 5 finger opposition c increased time" RUE Sensation: (Pt reports paresthesias have resolved since last night) RUE Coordination: decreased fine motor  Lower Extremity Assessment: RLE deficits/detail(L LE WFL) RLE Deficits / Details: hip flexion 3-/5; knee flexion 3+/5; knee extension 3-/5; DF at least 3/5 AROM(Intact proprioception, tone, and light touch) RLE Coordination: decreased gross motor    ADLs  Overall ADL's : Needs assistance/impaired General ADL Comments: MOD A don/doff R sock seated EOB, CGA don/doff L sock. SETUP unilateral grooming tasks seated EOB.     Mobility  Overal bed mobility: Needs Assistance Bed Mobility: Supine to Sit Supine to sit: Mod assist, HOB elevated Sit to supine: Min assist General bed mobility comments: pt attempted to perform on own (towards L side of bed) but unable so therapist provided mod assist for trunk semi-supine to sitting edge of bed; vc's for technique    Transfers  Overall  transfer level: Needs assistance Equipment used: Rolling walker (2 wheeled) Transfers: Sit to/from Stand Sit to Stand: Min guard, Min assist, Mod assist, +2 physical assistance General transfer comment: pt attempted to stand from bed on his own but unable even with vc's; mod assist x1 plus CGA of 2nd to stand from bed; CGA to min assist x2 to stand from recliner; vc's and assist to place R hand properly on walker    Ambulation / Gait / Stairs / Wheelchair Mobility  Ambulation/Gait Ambulation/Gait assistance: Min assist, Mod assist, +2 physical assistance Gait Distance (Feet): 38 Feet Assistive device: Rolling walker (2 wheeled) General Gait Details: pt initally with narrow BOS requiring cueing to increase BOS; mild R knee flexion noted during R LE stance phase; decreased R LE step length noted with increased distance ambulating; pt with R lean during ambulation requiring min to mod assist to maintain upright posture (vc's to shift weight towards L side); assist to steady towards end of ambulation Gait velocity: decreased    Posture / Balance Dynamic Sitting Balance Sitting balance - Comments: steady sitting reaching within BOS Balance Overall balance assessment: Needs assistance Sitting-balance support: No upper extremity supported, Feet supported Sitting balance-Leahy Scale: Good Sitting balance - Comments: steady sitting reaching within BOS Standing balance support: Single extremity supported Standing balance-Leahy Scale: Poor Standing balance comment: pt requiring at least L UE support for static standing balance    Special needs/care consideration Diabetic Management Hgb A1c 6.6 Designated visitors are wife, Arbie Cookey and daughter, Joycelyn Schmid Patient is [facility]; hears best left ear and likes to read lips   Previous Home Environment  Living Arrangements: Spouse/significant other  Lives With: Spouse Available Help at Discharge: Family, Available 24 hours/day Type of Home: House Home Layout:  One level Home Access: Stairs to enter Entrance Stairs-Rails: Right Entrance Stairs-Number of Steps: 3 (from car-port) Bathroom Shower/Tub: Multimedia programmer: Standard Bathroom Accessibility: Yes How Accessible: Accessible via walker Home Care Services: No  Discharge Living Setting Plans for Discharge Living Setting: Patient's home, Lives with (comment)(spouse) Type of Home at Discharge: House Discharge Home Layout: One level Discharge Home Access: Stairs to enter Entrance Stairs-Rails: Right Entrance Stairs-Number of Steps: 3 from [place] Discharge Bathroom Shower/Tub: Walk-in shower Discharge Bathroom Toilet: Standard Discharge Bathroom Accessibility: Yes How Accessible: Accessible via walker Does the patient have any problems obtaining your medications?: No  Social/Family/Support Systems Patient Roles: Spouse, Parent(has 3 adult children local) Contact Information: wife and daughter Anticipated Caregiver: wife and daughter and daughter's family Anticipated Caregiver's Contact Information: see above Ability/Limitations of  Caregiver: wife can supervise with light min assist for adls; daughter local but she and her family can assist prn Caregiver Availability: 24/7 Discharge Plan Discussed with Primary Caregiver: Yes Is Caregiver In Agreement with Plan?: Yes Does Caregiver/Family have Issues with Lodging/Transportation while Pt is in Rehab?: No  Goals Patient/Family Goal for Rehab: Mod I to supervision PT, superivison to min OT, Mod I to supervision with SLP Expected length of stay: ELOS 10 to 14 days Additional Information: Very HOH; hears best left ear; like to read lips Pt/Family Agrees to Admission and willing to participate: Yes Program Orientation Provided & Reviewed with Pt/Caregiver Including Roles  & Responsibilities: Yes  Decrease burden of Care through IP rehab admission: n/a  Possible need for SNF placement upon discharge: not anticipated  Patient  Condition: I have reviewed medical records from [facility] , spoken with CM, and patient, spouse and daughter. I met with patient at the bedside for inpatient rehabilitation assessment.  Patient will benefit from ongoing PT, OT and SLP, can actively participate in 3 hours of therapy a day 5 days of the week, and can make measurable gains during the admission.  Patient will also benefit from the coordinated team approach during an Inpatient Acute Rehabilitation admission.  The patient will receive intensive therapy as well as Rehabilitation physician, nursing, social worker, and care management interventions.  Due to bladder management, bowel management, safety, skin/wound care, disease management, medication administration, pain management and patient education the patient requires 24 hour a day rehabilitation nursing.  The patient is currently min to mod assist with mobility and basic ADLs.  Discharge setting and therapy post discharge at home with home health is anticipated.  Patient has agreed to participate in the Acute Inpatient Rehabilitation Program and will admit today.  Preadmission Screen Completed By:  Cleatrice Burke, 10/28/2019 9:57 AM ______________________________________________________________________   Discussed status with Dr. Dagoberto Ligas on  10/28/2019 at  0957 and received approval for admission today.  Admission Coordinator:  Cleatrice Burke, RN, time 9295 Date  10/28/2019   Assessment/Plan: Diagnosis: B/L embolic Strokes with R hemiparesis and slurred speech 1. Does the need for close, 24 hr/day Medical supervision in concert with the patient's rehab needs make it unreasonable for this patient to be served in a less intensive setting? Yes 2. Co-Morbidities requiring supervision/potential complications: HTN, DM, chronic back pain s/p laminectomy  3. Due to bladder management, bowel management, safety, skin/wound care, disease management, medication  administration, pain management and patient education, does the patient require 24 hr/day rehab nursing? Yes 4. Does the patient require coordinated care of a physician, rehab nurse, PT, OT, and SLP to address physical and functional deficits in the context of the above medical diagnosis(es)? Yes Addressing deficits in the following areas: balance, endurance, locomotion, strength, transferring, bathing, dressing, feeding, grooming, toileting, cognition, speech and psychosocial support 5. Can the patient actively participate in an intensive therapy program of at least 3 hrs of therapy 5 days a week? Yes 6. The potential for patient to make measurable gains while on inpatient rehab is good and fair 7. Anticipated functional outcomes upon discharge from inpatient rehab: modified independent and supervision PT, supervision and min assist OT, modified independent SLP 8. Estimated rehab length of stay to reach the above functional goals is: 10-14 days 9. Anticipated discharge destination: Home 10. Overall Rehab/Functional Prognosis: good and fair   MD Signature:

## 2019-10-28 NOTE — Progress Notes (Signed)
Occupational Therapy Treatment Patient Details Name: Jordan Chang MRN: [id number] DOB: [birth year] Today's Date: 10/28/2019    History of present illness Pt is an 79 y.o. male presenting to hospital 5/2 with slurred speech, R sided weakness, and facial droop; also slight HA and double vision.  Pt admitted with acute CVA (MRI of brain showing few small acute to subacute infarcts involving multiple vascular territories).  PMH includes htn, DM, neuropathy, chronic back pain, h/o back surgery.   OT comments  Jordan Chang was seen for OT treatment on this date. Upon arrival to room pt and wife reporting desire to wash up and dress prior to discharge this AM. Pt reports pain (not quantified) in L ankle c swelling noted and states MD/RN are aware. Pt agreeable to bathe seated EOB c feet unsupported stating "let me do what I can for myself." Pt and caregiver instructed in hemi-dressing techniques, energy conservation, RUE HEP, and d/c recommendations. Pt currently requires MOD A bathing seated EOB - pt washes RUE, chest, tops of thighs, and groin c SETUP - pt washes LUE c OT support at elbow and hand over hand assist for thoroughness - OT assist for BLE, back, and lateral chest. MAX A don boxers and shorts seated EOB c lateral leans (pt attempted LBD in standing at EOB however unable to stand c MAX A  2/2 pain in L ankle). MOD A don/doff gown seated EOB. Pt verbalized understanding of instruction provided. Pt making good progress toward goals. Pt continues to benefit from skilled OT services to maximize return to PLOF and minimize risk of future falls, injury, caregiver burden, and readmission. Will continue to follow POC. Pt continues to be excellent candidate for CIR.     Follow Up Recommendations  CIR    Equipment Recommendations  Other (comment)(TBD next venue of care)    Recommendations for Other Services      Precautions / Restrictions Precautions Precautions: Fall Precaution Comments: Aspiration        Mobility Bed Mobility Overal bed mobility: Needs Assistance Bed Mobility: Supine to Sit;Sit to Supine     Supine to sit: Mod assist;HOB elevated Sit to supine: Min assist      Transfers Overall transfer level: Needs assistance Equipment used: Rolling walker (2 wheeled) Transfers: Sit to/from Stand           General transfer comment: Pt attempted sit>stand at EOB from elevated surface but unable to safely c MAX A after prolonged ADL session and likely r/t pain in L ankle.     Balance Overall balance assessment: Needs assistance Sitting-balance support: Single extremity supported;Feet unsupported Sitting balance-Leahy Scale: Fair Sitting balance - Comments: steady sitting reaching within BOS                                   ADL either performed or assessed with clinical judgement   ADL Overall ADL's : Needs assistance/impaired                                       General ADL Comments: MOD A bathing seated EOB - pt washes RUE, chest, tops of thighs, and groin c SETUP - pt washes LUE c support at elbow and assist for thoroughness - OT assist for BLE, back, and sides. MAX A don boxers and shorts seated EOB and bed level (  pt attempted in standing at EOB however unable to stand as well as yesterday 2/2 pain in L ankle). MOD A don/doff gown seated EOB      Vision       Perception     Praxis      Cognition Arousal/Alertness: Awake/alert Behavior During Therapy: WFL for tasks assessed/performed Overall Cognitive Status: Within Functional Limits for tasks assessed                                          Exercises Exercises: Other exercises Other Exercises Other Exercises: Pt and caregiver educated re: hemi dressing techniques, d/c recs, energy conservation Other Exercises: Bathing, don/doff gown, don boxers/shorts, bed mobility, sup<>sit, sitting balance/tolerance   Shoulder Instructions       General  Comments L ankle edema noted    Pertinent Vitals/ Pain       Pain Assessment: Faces Faces Pain Scale: Hurts little more Pain Location: L ankle Pain Descriptors / Indicators: Dull;Grimacing Pain Intervention(s): Limited activity within patient's tolerance;Repositioned  Home Living                           Bathroom Accessibility: Yes How Accessible: Accessible via walker            Prior Functioning/Environment              Frequency  Min 3X/week        Progress Toward Goals  OT Goals(current goals can now be found in the care plan section)  Progress towards OT goals: Progressing toward goals  Acute Rehab OT Goals Patient Stated Goal: To get stronger OT Goal Formulation: With patient/family Time For Goal Achievement: 11/09/19 Potential to Achieve Goals: Good ADL Goals Pt Will Perform Grooming: with set-up;sitting Pt Will Perform Lower Body Dressing: with set-up;sit to/from stand Pt Will Transfer to Toilet: with min guard assist;stand pivot transfer;bedside commode( c LRAD PRN) Pt Will Perform Toileting - Clothing Manipulation and hygiene: with supervision;sit to/from stand Pt/caregiver will Perform Home Exercise Program: Increased ROM;Increased strength;Right Upper extremity;Independently  Plan Discharge plan remains appropriate;Frequency remains appropriate    Co-evaluation                 AM-PAC OT "6 Clicks" Daily Activity     Outcome Measure   Help from another person eating meals?: A Little Help from another person taking care of personal grooming?: A Little Help from another person toileting, which includes using toliet, bedpan, or urinal?: A Little Help from another person bathing (including washing, rinsing, drying)?: A Lot Help from another person to put on and taking off regular upper body clothing?: A Little Help from another person to put on and taking off regular lower body clothing?: A Lot 6 Click Score: 16    End of  Session    OT Visit Diagnosis: Unsteadiness on feet (R26.81);Other abnormalities of gait and mobility (R26.89);Hemiplegia and hemiparesis Hemiplegia - Right/Left: Right Hemiplegia - dominant/non-dominant: Dominant Hemiplegia - caused by: Cerebral infarction   Activity Tolerance Patient tolerated treatment well   Patient Left in bed;with call bell/phone within reach;with family/visitor present   Nurse Communication          Time: 5732-2025 OT Time Calculation (min): 52 min  Charges: OT General Charges $OT Visit: 1 Visit OT Treatments $Self Care/Home Management : 38-52 mins  Kathie Dike, M.S. OTR/L  10/28/19, 11:41 AM

## 2019-10-28 NOTE — Discharge Instructions (Signed)

## 2019-10-28 NOTE — Progress Notes (Signed)
Patient admitted to IP rehab during shift. Skin assessed and no impairments noted at time. All questions answered regarding ip rehab and safety policy. Kalman Shan, LPN

## 2019-10-28 NOTE — Discharge Summary (Signed)
Physician Discharge Summary  Jordan Chang STM:[id number] DOB: Dec 13, 1940 DOA: 10/26/2019  PCP: Barbette Reichmann, MD  Admit date: 10/26/2019 Discharge date: 10/28/2019  Admitted From: Home Disposition: CIR  Recommendations for Outpatient Follow-up:  Follow-up with MD at CIR within 1 week. Needs outpatient Holter monitoring for 2-4 weeks. Outpatient referral to stroke clinic [facility] neurology)   Equipment/Devices: Per therapy at rehab  Discharge Condition: Fair CODE STATUS: Full code Diet recommendation: Heart Healthy / Carb Modified    Discharge Diagnoses:  Principal Problem:   Acute CVA (cerebrovascular accident) (HCC)  Active Problems:   Chronic back pain   Type 2 diabetes mellitus with other specified complication (HCC)   Essential hypertension   Hyperlipidemia  Brief narrative/HPI 79 year old obese male with history of hypertension, diabetes mellitus, chronic back pain and neuropathy presented with acute onset of slurred speech with right arm and leg weakness with some facial drooping.  Onset of symptoms was on the evening prior to admission. Patient was out of the therapeutic window for TPA.  Blood pressure was mildly elevated.  head CT negative for acute findings.  CT angiogram of the head and neck also did not show any significant stenosis. Patient admitted for acute stroke.  Hospital course   Principal Problem:   Acute CVA (cerebrovascular accident) [facility]) MRI brain showing triple small acute to subacute infarct involving bilateral brain parenchyma (left pons, inferior right cerebellum, left frontal lobe and right parietal lobe).  Also shows chronic microvascular ischemic changes and small chronic right cerebellar infarct. Suspect embolic stroke based on findings.  2D echo with normal EF and no wall motion abnormality or cardiac source of emboli.  LDL of 130.  A1c of 6.6.    Lipitor dose increased to 40 mg daily. Patient was already on baby aspirin and Plavix  was added. Speech improved.  Still has right-sided weakness which is improving.  PT recommends CIR.     Neurology consult appreciated recommends to continue aspirin, Plavix and statin.    Recommends outpatient Holter monitoring to rule out arrhythmia given bilateral stroke.   Allow permissive blood pressure.  Counseled on lifestyle modification including diet and exercise to lose weight, medication adherence, tight blood pressure and blood glucose control, lipid-lowering and other secondary stroke prevention.   Active Problems:    Type 2 diabetes mellitus with other specified complication (HCC) A1c of 6.6.    CBG stable.  Not on any home meds.    Essential hypertension Allow permissive blood pressure for now.  Resume losartan and Maxide upon discharge.  Needs tight blood glucose control.  Obesity (BMI 39.5 kg/m) Counseled on weight loss and exercise.  Consults: Neurology  Procedure: Head CT, CTA head and neck, 2D echo, Doppler left lower leg  Family Communication: Wife involved in care  Discharge Instructions   Allergies as of 10/28/2019      Reactions   Other Other (See Comments)   STEROIDS, not recorded, caused tachycardia Pt "does not know name"      Medication List    STOP taking these medications   traMADol 50 MG tablet Commonly known as: Ultram     TAKE these medications   aspirin EC 81 MG tablet Take 81 mg by mouth every morning.   atorvastatin 40 MG tablet Commonly known as: LIPITOR Take 1 tablet (40 mg total) by mouth every other day. What changed:   medication strength  how much to take   clopidogrel 75 MG tablet Commonly known as: PLAVIX Take 1 tablet (75  mg total) by mouth daily. Start taking on: Oct 29, 2019   cyclobenzaprine 10 MG tablet Commonly known as: FLEXERIL Take 1 tablet (10 mg total) by mouth 3 (three) times daily as needed for muscle spasms.   D3-1000 PO Take 1,000 mcg by mouth daily.   Vitamin D-1000 Max St 25 MCG  (1000 UT) tablet Generic drug: Cholecalciferol Take 2,000 Units by mouth daily.   docusate sodium 100 MG capsule Commonly known as: COLACE Take 1 capsule (100 mg total) by mouth 2 (two) times daily.   gabapentin 300 MG capsule Commonly known as: NEURONTIN Take 600 mg by mouth 3 (three) times daily.   ibuprofen 200 MG tablet Commonly known as: GoodSense Ibuprofen Take 3 tablets (600 mg total) by mouth every 8 (eight) hours as needed. What changed:   when to take this  reasons to take this   losartan 50 MG tablet Commonly known as: COZAAR Take 50 mg by mouth daily.   oxyCODONE-acetaminophen 5-325 MG tablet Commonly known as: PERCOCET/ROXICET Take 1-2 tablets by mouth every 4 (four) hours as needed for moderate pain.   Pepcid 20 MG tablet Generic drug: famotidine Take 20 mg by mouth daily as needed for heartburn or indigestion.   triamterene-hydrochlorothiazide 37.5-25 MG tablet Commonly known as: MAXZIDE-25 Take 1 tablet by mouth daily.   vitamin B-12 1000 MCG tablet Commonly known as: CYANOCOBALAMIN Take 1,000 mcg by mouth daily.      Follow-up Information    MD at CIR in 1 week Follow up.          Allergies  Allergen Reactions  . Other Other (See Comments)    STEROIDS, not recorded, caused tachycardia Pt "does not know name"     Procedures/Studies: CT Angio Head W or Wo Contrast  Result Date: 10/26/2019 CLINICAL DATA:  Slurred speech and right-sided weakness EXAM: CT ANGIOGRAPHY HEAD AND NECK CT PERFUSION BRAIN TECHNIQUE: Multidetector CT imaging of the head and neck was performed using the standard protocol during bolus administration of intravenous contrast. Multiplanar CT image reconstructions and MIPs were obtained to evaluate the vascular anatomy. Carotid stenosis measurements (when applicable) are obtained utilizing NASCET criteria, using the distal internal carotid diameter as the denominator. Multiphase CT imaging of the brain was performed following  IV bolus contrast injection. Subsequent parametric perfusion maps were calculated using RAPID software. CONTRAST:  OMNIPAQUE IOHEXOL 350 MG/ML SOLN COMPARISON:  None. FINDINGS: CT HEAD FINDINGS Brain: There is no mass, hemorrhage or extra-axial collection. The size and configuration of the ventricles and extra-axial CSF spaces are normal. No acute cortical infarct. There is hypoattenuation of the periventricular white matter, most commonly indicating chronic ischemic microangiopathy. Skull: The visualized skull base, calvarium and extracranial soft tissues are normal. Sinuses/Orbits: No fluid levels or advanced mucosal thickening of the visualized paranasal sinuses. No mastoid or middle ear effusion. The orbits are normal. CTA NECK FINDINGS SKELETON: Grade 1 anterolisthesis at C4-5. OTHER NECK: Normal pharynx, larynx and major salivary glands. No cervical lymphadenopathy. Unremarkable thyroid gland. UPPER CHEST: No pneumothorax or pleural effusion. No nodules or masses. AORTIC ARCH: There is mild calcific atherosclerosis of the aortic arch. There is no aneurysm, dissection or hemodynamically significant stenosis of the visualized portion of the aorta. Conventional 3 vessel aortic branching pattern. The visualized proximal subclavian arteries are widely patent. RIGHT CAROTID SYSTEM: No dissection, occlusion or aneurysm. Mild atherosclerotic calcification at the carotid bifurcation without hemodynamically significant stenosis. LEFT CAROTID SYSTEM: No dissection, occlusion or aneurysm. Mild atherosclerotic calcification at the  carotid bifurcation without hemodynamically significant stenosis. VERTEBRAL ARTERIES: Right dominant configuration. The left vertebral artery is non-opacified at multiple locations of the V1 segment and proximal V2 segment. The distal V4 segment is also poorly opacified, but this might be partially due to poor contrast bolus. There is multifocal mild atherosclerosis of the right vertebral  artery. CTA HEAD FINDINGS POSTERIOR CIRCULATION: --Vertebral arteries: Non opacification of the distal V4 segment of the left vertebral artery. Moderate atherosclerotic narrowing of the right V4 segment. --Posterior inferior cerebellar arteries (PICA): Patent origins from the vertebral arteries. --Anterior inferior cerebellar arteries (AICA): Patent origins from the basilar artery. --Basilar artery: Normal. --Superior cerebellar arteries: Normal. --Posterior cerebral arteries: Normal. Both originate from the basilar artery. Posterior communicating arteries (p-comm) are diminutive or absent. ANTERIOR CIRCULATION: --Intracranial internal carotid arteries: Normal. --Anterior cerebral arteries (ACA): Normal. Both A1 segments are present. Patent anterior communicating artery (a-comm). --Middle cerebral arteries (MCA): Normal. VENOUS SINUSES: As permitted by contrast timing, patent. ANATOMIC VARIANTS: None Review of the MIP images confirms the above findings. CT Brain Perfusion Findings: ASPECTS: 10 CBF (<30%) Volume: 0mL Perfusion (Tmax>6.0s) volume: 6mL Mismatch Volume: 6mL Infarction Location:None Note: The calculated area of ischemia is likely artifactual and appears to correspond to the CSF space in the posterior fossa. IMPRESSION: 1. No emergent large vessel occlusion or high-grade stenosis of the intracranial arteries. 2. Occlusion of the left vertebral artery at multiple locations of the V1 segment and proximal V2 segment, with poor opacification of the distal V4 segment. This is likely secondary to atherosclerotic disease and exaggerated by a relatively poor contrast bolus. 3. No acute infarct by CT perfusion analysis. Small region of calculated penumbra in the left posterior fossa is considered artifactual. 4. Aortic Atherosclerosis (ICD10-I70.0). Electronically Signed   By: Deatra Robinson M.D.   On: 10/26/2019 03:23   CT ANGIO NECK W OR WO CONTRAST  Result Date: 10/26/2019 CLINICAL DATA:  Slurred speech and  right-sided weakness EXAM: CT ANGIOGRAPHY HEAD AND NECK CT PERFUSION BRAIN TECHNIQUE: Multidetector CT imaging of the head and neck was performed using the standard protocol during bolus administration of intravenous contrast. Multiplanar CT image reconstructions and MIPs were obtained to evaluate the vascular anatomy. Carotid stenosis measurements (when applicable) are obtained utilizing NASCET criteria, using the distal internal carotid diameter as the denominator. Multiphase CT imaging of the brain was performed following IV bolus contrast injection. Subsequent parametric perfusion maps were calculated using RAPID software. CONTRAST:  OMNIPAQUE IOHEXOL 350 MG/ML SOLN COMPARISON:  None. FINDINGS: CT HEAD FINDINGS Brain: There is no mass, hemorrhage or extra-axial collection. The size and configuration of the ventricles and extra-axial CSF spaces are normal. No acute cortical infarct. There is hypoattenuation of the periventricular white matter, most commonly indicating chronic ischemic microangiopathy. Skull: The visualized skull base, calvarium and extracranial soft tissues are normal. Sinuses/Orbits: No fluid levels or advanced mucosal thickening of the visualized paranasal sinuses. No mastoid or middle ear effusion. The orbits are normal. CTA NECK FINDINGS SKELETON: Grade 1 anterolisthesis at C4-5. OTHER NECK: Normal pharynx, larynx and major salivary glands. No cervical lymphadenopathy. Unremarkable thyroid gland. UPPER CHEST: No pneumothorax or pleural effusion. No nodules or masses. AORTIC ARCH: There is mild calcific atherosclerosis of the aortic arch. There is no aneurysm, dissection or hemodynamically significant stenosis of the visualized portion of the aorta. Conventional 3 vessel aortic branching pattern. The visualized proximal subclavian arteries are widely patent. RIGHT CAROTID SYSTEM: No dissection, occlusion or aneurysm. Mild atherosclerotic calcification at the carotid bifurcation without  hemodynamically significant stenosis. LEFT CAROTID SYSTEM: No dissection, occlusion or aneurysm. Mild atherosclerotic calcification at the carotid bifurcation without hemodynamically significant stenosis. VERTEBRAL ARTERIES: Right dominant configuration. The left vertebral artery is non-opacified at multiple locations of the V1 segment and proximal V2 segment. The distal V4 segment is also poorly opacified, but this might be partially due to poor contrast bolus. There is multifocal mild atherosclerosis of the right vertebral artery. CTA HEAD FINDINGS POSTERIOR CIRCULATION: --Vertebral arteries: Non opacification of the distal V4 segment of the left vertebral artery. Moderate atherosclerotic narrowing of the right V4 segment. --Posterior inferior cerebellar arteries (PICA): Patent origins from the vertebral arteries. --Anterior inferior cerebellar arteries (AICA): Patent origins from the basilar artery. --Basilar artery: Normal. --Superior cerebellar arteries: Normal. --Posterior cerebral arteries: Normal. Both originate from the basilar artery. Posterior communicating arteries (p-comm) are diminutive or absent. ANTERIOR CIRCULATION: --Intracranial internal carotid arteries: Normal. --Anterior cerebral arteries (ACA): Normal. Both A1 segments are present. Patent anterior communicating artery (a-comm). --Middle cerebral arteries (MCA): Normal. VENOUS SINUSES: As permitted by contrast timing, patent. ANATOMIC VARIANTS: None Review of the MIP images confirms the above findings. CT Brain Perfusion Findings: ASPECTS: 10 CBF (<30%) Volume: 62mL Perfusion (Tmax>6.0s) volume: 49mL Mismatch Volume: 90mL Infarction Location:None Note: The calculated area of ischemia is likely artifactual and appears to correspond to the CSF space in the posterior fossa. IMPRESSION: 1. No emergent large vessel occlusion or high-grade stenosis of the intracranial arteries. 2. Occlusion of the left vertebral artery at multiple locations of the V1  segment and proximal V2 segment, with poor opacification of the distal V4 segment. This is likely secondary to atherosclerotic disease and exaggerated by a relatively poor contrast bolus. 3. No acute infarct by CT perfusion analysis. Small region of calculated penumbra in the left posterior fossa is considered artifactual. 4. Aortic Atherosclerosis (ICD10-I70.0). Electronically Signed   By: Ulyses Jarred M.D.   On: 10/26/2019 03:23   MR BRAIN WO CONTRAST  Result Date: 10/26/2019 CLINICAL DATA:  Slurred speech right leg weakness, code stroke follow-up EXAM: MRI HEAD WITHOUT CONTRAST TECHNIQUE: Multiplanar, multiecho pulse sequences of the brain and surrounding structures were obtained without intravenous contrast. COMPARISON:  Correlation made with prior CT imaging FINDINGS: Brain: There is an approximately 11 mm focus of mildly reduced diffusion involving the left aspect of pons. Additional smaller foci of mildly reduced diffusion the inferior right cerebellum (image 8), left frontal lobe (image 39), and right parietal lobe (image 34). There is no evidence of intracranial hemorrhage. There is no intracranial mass, mass effect, or edema. There is no hydrocephalus or extra-axial fluid collection. Patchy foci of T2 hyperintensity in the supratentorial white matter are nonspecific but may reflect mild to moderate chronic microvascular ischemic changes. Small chronic right cerebellar infarcts. Prominence of ventricles and sulci reflects minor generalized parenchymal volume loss. Vascular: Diminished flow void of diminutive left vertebral artery. Major vessel flow voids at the skull base are otherwise preserved. Skull and upper cervical spine: Normal marrow signal is preserved. Sinuses/Orbits: Minor mucosal thickening. Bilateral lens replacements. Other: Sella is unremarkable.  Mastoid air cells are clear. IMPRESSION: Few small acute to subacute infarcts involving multiple vascular territories. Chronic microvascular  ischemic changes. Small chronic right cerebellar infarcts. Electronically Signed   By: Macy Mis M.D.   On: 10/26/2019 07:50   US Venous Img Lower Unilateral Left (DVT)  Result Date: 10/26/2019 CLINICAL DATA:  Left lower extremity edema. EXAM: LEFT LOWER EXTREMITY VENOUS DOPPLER ULTRASOUND TECHNIQUE: Gray-scale sonography with graded compression, as well  as color Doppler and duplex ultrasound were performed to evaluate the lower extremity deep venous systems from the level of the common femoral vein and including the common femoral, femoral, profunda femoral, popliteal and calf veins including the posterior tibial, peroneal and gastrocnemius veins when visible. The superficial great saphenous vein was also interrogated. Spectral Doppler was utilized to evaluate flow at rest and with distal augmentation maneuvers in the common femoral, femoral and popliteal veins. COMPARISON:  02/03/2016 FINDINGS: Contralateral Common Femoral Vein: Respiratory phasicity is normal and symmetric with the symptomatic side. No evidence of thrombus. Normal compressibility. Common Femoral Vein: No evidence of thrombus. Normal compressibility, respiratory phasicity and response to augmentation. Saphenofemoral Junction: No evidence of thrombus. Normal compressibility and flow on color Doppler imaging. Profunda Femoral Vein: No evidence of thrombus. Normal compressibility and flow on color Doppler imaging. Femoral Vein: No evidence of thrombus. Normal compressibility, respiratory phasicity and response to augmentation. Popliteal Vein: No evidence of thrombus. Normal compressibility, respiratory phasicity and response to augmentation. Calf Veins: No evidence of thrombus. Normal compressibility and flow on color Doppler imaging. Superficial Great Saphenous Vein: No evidence of thrombus. Normal compressibility. Venous Reflux:  None. Other Findings: No evidence of superficial thrombophlebitis or abnormal fluid collection. IMPRESSION: No  evidence of left lower extremity deep venous thrombosis. Electronically Signed   By: Irish LackGlenn  Yamagata M.D.   On: 10/26/2019 14:43   CT CEREBRAL PERFUSION W CONTRAST  Result Date: 10/26/2019 CLINICAL DATA:  Slurred speech and right-sided weakness EXAM: CT ANGIOGRAPHY HEAD AND NECK CT PERFUSION BRAIN TECHNIQUE: Multidetector CT imaging of the head and neck was performed using the standard protocol during bolus administration of intravenous contrast. Multiplanar CT image reconstructions and MIPs were obtained to evaluate the vascular anatomy. Carotid stenosis measurements (when applicable) are obtained utilizing NASCET criteria, using the distal internal carotid diameter as the denominator. Multiphase CT imaging of the brain was performed following IV bolus contrast injection. Subsequent parametric perfusion maps were calculated using RAPID software. CONTRAST:  100mL OMNIPAQUE IOHEXOL 350 MG/ML SOLN COMPARISON:  None. FINDINGS: CT HEAD FINDINGS Brain: There is no mass, hemorrhage or extra-axial collection. The size and configuration of the ventricles and extra-axial CSF spaces are normal. No acute cortical infarct. There is hypoattenuation of the periventricular white matter, most commonly indicating chronic ischemic microangiopathy. Skull: The visualized skull base, calvarium and extracranial soft tissues are normal. Sinuses/Orbits: No fluid levels or advanced mucosal thickening of the visualized paranasal sinuses. No mastoid or middle ear effusion. The orbits are normal. CTA NECK FINDINGS SKELETON: Grade 1 anterolisthesis at C4-5. OTHER NECK: Normal pharynx, larynx and major salivary glands. No cervical lymphadenopathy. Unremarkable thyroid gland. UPPER CHEST: No pneumothorax or pleural effusion. No nodules or masses. AORTIC ARCH: There is mild calcific atherosclerosis of the aortic arch. There is no aneurysm, dissection or hemodynamically significant stenosis of the visualized portion of the aorta. Conventional 3  vessel aortic branching pattern. The visualized proximal subclavian arteries are widely patent. RIGHT CAROTID SYSTEM: No dissection, occlusion or aneurysm. Mild atherosclerotic calcification at the carotid bifurcation without hemodynamically significant stenosis. LEFT CAROTID SYSTEM: No dissection, occlusion or aneurysm. Mild atherosclerotic calcification at the carotid bifurcation without hemodynamically significant stenosis. VERTEBRAL ARTERIES: Right dominant configuration. The left vertebral artery is non-opacified at multiple locations of the V1 segment and proximal V2 segment. The distal V4 segment is also poorly opacified, but this might be partially due to poor contrast bolus. There is multifocal mild atherosclerosis of the right vertebral artery. CTA HEAD FINDINGS POSTERIOR CIRCULATION: --Vertebral  arteries: Non opacification of the distal V4 segment of the left vertebral artery. Moderate atherosclerotic narrowing of the right V4 segment. --Posterior inferior cerebellar arteries (PICA): Patent origins from the vertebral arteries. --Anterior inferior cerebellar arteries (AICA): Patent origins from the basilar artery. --Basilar artery: Normal. --Superior cerebellar arteries: Normal. --Posterior cerebral arteries: Normal. Both originate from the basilar artery. Posterior communicating arteries (p-comm) are diminutive or absent. ANTERIOR CIRCULATION: --Intracranial internal carotid arteries: Normal. --Anterior cerebral arteries (ACA): Normal. Both A1 segments are present. Patent anterior communicating artery (a-comm). --Middle cerebral arteries (MCA): Normal. VENOUS SINUSES: As permitted by contrast timing, patent. ANATOMIC VARIANTS: None Review of the MIP images confirms the above findings. CT Brain Perfusion Findings: ASPECTS: 10 CBF (<30%) Volume: 0mL Perfusion (Tmax>6.0s) volume: 6mL Mismatch Volume: 6mL Infarction Location:None Note: The calculated area of ischemia is likely artifactual and appears to  correspond to the CSF space in the posterior fossa. IMPRESSION: 1. No emergent large vessel occlusion or high-grade stenosis of the intracranial arteries. 2. Occlusion of the left vertebral artery at multiple locations of the V1 segment and proximal V2 segment, with poor opacification of the distal V4 segment. This is likely secondary to atherosclerotic disease and exaggerated by a relatively poor contrast bolus. 3. No acute infarct by CT perfusion analysis. Small region of calculated penumbra in the left posterior fossa is considered artifactual. 4. Aortic Atherosclerosis (ICD10-I70.0). Electronically Signed   By: Deatra Robinson M.D.   On: 10/26/2019 03:23   ECHOCARDIOGRAM COMPLETE  Result Date: 10/27/2019    ECHOCARDIOGRAM REPORT   Patient Name:   Jordan Chang Date of Exam: 10/26/2019 Medical Rec #:  [id number]       Height:       71.0 in Accession #:    [id number]      Weight:       283.3 lb Date of Birth:  October 03, 1940      BSA:          2.445 m Patient Age:    78 years        BP:           142/75 mmHg Patient Gender: M               HR:           92 bpm. Exam Location:  [facility] Procedure: 2D Echo and Intracardiac Opacification Agent Indications:     Stroke 434.91/ I163.9  History:         Patient has no prior history of Echocardiogram examinations.  Sonographer:     Wonda Cerise RDCS Referring Phys:  [id number] Andris Baumann Diagnosing Phys: Harold Hedge MD  Sonographer Comments: Technically challenging study due to limited acoustic windows, Technically difficult study due to poor echo windows and patient is morbidly obese. Image acquisition challenging due to patient body habitus. IMPRESSIONS  1. Left ventricular ejection fraction, by estimation, is 60 to 65%. The left ventricle has normal function. The left ventricle has no regional wall motion abnormalities. There is mild left ventricular hypertrophy. Left ventricular diastolic parameters are consistent with Grade I diastolic dysfunction (impaired  relaxation).  2. Right ventricular systolic function is normal. The right ventricular size is normal.  3. The mitral valve is degenerative. Trivial mitral valve regurgitation. No evidence of mitral stenosis.  4. The aortic valve was not well visualized. Aortic valve regurgitation is trivial.  5. Aortic dilatation noted. FINDINGS  Left Ventricle: Left ventricular ejection fraction, by estimation, is 60 to 65%. The left ventricle has  normal function. The left ventricle has no regional wall motion abnormalities. Definity contrast agent was given IV to delineate the left ventricular  endocardial borders. The left ventricular internal cavity size was normal in size. There is mild left ventricular hypertrophy. Left ventricular diastolic parameters are consistent with Grade I diastolic dysfunction (impaired relaxation). Right Ventricle: The right ventricular size is normal. No increase in right ventricular wall thickness. Right ventricular systolic function is normal. Left Atrium: Left atrial size was normal in size. Right Atrium: Right atrial size was normal in size. Pericardium: There is no evidence of pericardial effusion. Mitral Valve: The mitral valve is degenerative in appearance. Trivial mitral valve regurgitation. No evidence of mitral valve stenosis. MV peak gradient, 11.3 mmHg. The mean mitral valve gradient is 5.0 mmHg. Tricuspid Valve: The tricuspid valve is not well visualized. Tricuspid valve regurgitation is trivial. Aortic Valve: The aortic valve was not well visualized. Aortic valve regurgitation is trivial. Aortic valve peak gradient measures 5.1 mmHg. Pulmonic Valve: The pulmonic valve was not well visualized. Pulmonic valve regurgitation is not visualized. Aorta: Aortic dilatation noted. IAS/Shunts: The interatrial septum was not assessed.  LEFT VENTRICLE PLAX 2D LVIDd:         3.55 cm      Diastology LVIDs:         2.51 cm      LV e' lateral:   8.27 cm/s LV PW:         1.19 cm      LV E/e' lateral:  14.8 LV IVS:        1.58 cm      LV e' medial:    3.81 cm/s LVOT diam:     2.20 cm      LV E/e' medial:  32.0 LV SV:         55 LV SV Index:   22 LVOT Area:     3.80 cm  LV Volumes (MOD) LV vol d, MOD A2C: 96.3 ml LV vol d, MOD A4C: 133.0 ml LV vol s, MOD A2C: 38.5 ml LV vol s, MOD A4C: 41.5 ml LV SV MOD A2C:     57.8 ml LV SV MOD A4C:     133.0 ml LV SV MOD BP:      75.9 ml RIGHT VENTRICLE RV Basal diam:  3.02 cm RV S prime:     10.40 cm/s TAPSE (M-mode): 1.9 cm LEFT ATRIUM             Index LA diam:        3.60 cm 1.47 cm/m LA Vol (A2C):   24.0 ml 9.82 ml/m LA Vol (A4C):   59.6 ml 24.38 ml/m LA Biplane Vol: 40.5 ml 16.57 ml/m  AORTIC VALVE AV Area (Vmax): 2.58 cm AV Vmax:        113.00 cm/s AV Peak Grad:   5.1 mmHg LVOT Vmax:      76.60 cm/s LVOT Vmean:     47.500 cm/s LVOT VTI:       0.144 m  AORTA Ao Root diam: 3.60 cm MITRAL VALVE MV Area (PHT): 4.21 cm     SHUNTS MV Peak grad:  11.3 mmHg    Systemic VTI:  0.14 m MV Mean grad:  5.0 mmHg     Systemic Diam: 2.20 cm MV Vmax:       1.68 m/s MV Vmean:      99.9 cm/s MV Decel Time: 180 msec MV E velocity: 122.00 cm/s MV A velocity: 171.00 cm/s MV  E/A ratio:  0.71 Harold Hedge MD Electronically signed by Harold Hedge MD Signature Date/Time: 10/27/2019/7:18:59 AM    Final    CT HEAD CODE STROKE WO CONTRAST`  Result Date: 10/26/2019 CLINICAL DATA:  Code stroke.  Slurred speech and right side weakness EXAM: CT HEAD WITHOUT CONTRAST TECHNIQUE: Contiguous axial images were obtained from the base of the skull through the vertex without intravenous contrast. COMPARISON:  None. FINDINGS: Brain: There is no mass, hemorrhage or extra-axial collection. The size and configuration of the ventricles and extra-axial CSF spaces are normal. There is hypoattenuation of the periventricular white matter, most commonly indicating chronic ischemic microangiopathy. Old right cerebellar infarct. Vascular: Atherosclerotic calcification of the vertebral and internal carotid arteries at  the skull base. No abnormal hyperdensity of the major intracranial arteries or dural venous sinuses. Skull: The visualized skull base, calvarium and extracranial soft tissues are normal. Sinuses/Orbits: No fluid levels or advanced mucosal thickening of the visualized paranasal sinuses. No mastoid or middle ear effusion. The orbits are normal. ASPECTS [facility] Stroke Program Early CT Score) - Ganglionic level infarction (caudate, lentiform nuclei, internal capsule, insula, M1-M3 cortex): 7 - Supraganglionic infarction (M4-M6 cortex): 3 Total score (0-10 with 10 being normal): 10 IMPRESSION: 1. No acute intracranial abnormality. 2. ASPECTS is 10. 3. 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 * These results were called by telephone at the time of interpretation on 10/26/2019 at 2:14 am to provider [facility] , who verbally acknowledged these results. Electronically Signed   By: Deatra Robinson M.D.   On: 10/26/2019 02:14       Subjective: Seen and examined.  She will see his leg weakness slowly getting better.  Complains of some pain in the right foot  Discharge Exam: Vitals:   10/28/19 0433 10/28/19 0738  BP: (!) 151/74 (!) 163/67  Pulse: 72 71  Resp: 20 20  Temp: 97.7 F (36.5 C)   SpO2: 93%    Vitals:   10/27/19 2028 10/28/19 0032 10/28/19 0433 10/28/19 0738  BP:  132/66 (!) 151/74 (!) 163/67  Pulse: 72 70 72 71  Resp:  Temp: 97.6 F (36.4 C) 98.7 F (37.1 C) 97.7 F (36.5 C)   TempSrc: Oral Oral Oral   SpO2: 98% 91% 93%   Weight:      Height:        General: Elderly obese male not in distress HEENT: Moist mucosa, supple neck Chest: Clear CVs: Normal S1-S2, no murmurs  GI: Soft, nondistended, nontender Musculoskeletal: Warm, bilateral trace pitting edema (chronic), CNS: Alert oriented, 4/5 power in the right upper arm and 4+/5 power in the right leg, normal sensation   The results of significant diagnostics from this hospitalization  (including imaging, microbiology, ancillary and laboratory) are listed below for reference.     Microbiology: Recent Results (from the past 240 hour(s))  Respiratory Panel by RT PCR (Flu A&B, Covid) - Nasopharyngeal Swab     Status: None   Collection Time: 10/26/19  4:50 AM   Specimen: Nasopharyngeal Swab  Result Value Ref Range Status   SARS Coronavirus 2 by RT PCR NEGATIVE NEGATIVE Final    Comment: (NOTE) SARS-CoV-2 target nucleic acids are NOT DETECTED. The SARS-CoV-2 RNA is generally detectable in upper respiratoy specimens during the acute phase of infection. The lowest concentration of SARS-CoV-2 viral copies this assay can detect is 131 copies/mL. A negative result does not preclude SARS-Cov-2 infection and should not be used as the sole basis for  treatment or other patient management decisions. A negative result may occur with  improper specimen collection/handling, submission of specimen other than nasopharyngeal swab, presence of viral mutation(s) within the areas targeted by this assay, and inadequate number of viral copies (<131 copies/mL). A negative result must be combined with clinical observations, patient history, and epidemiological information. The expected result is Negative. Fact Sheet for Patients:  https://www.moore.com/ Fact Sheet for Healthcare Providers:  https://www.young.biz/ This test is not yet ap proved or cleared by the [place] FDA and  has been authorized for detection and/or diagnosis of SARS-CoV-2 by FDA under an Emergency Use Authorization (EUA). This EUA will remain  in effect (meaning this test can be used) for the duration of the COVID-19 declaration under Section 564(b)(1) of the Act, 21 U.S.C. section 360bbb-3(b)(1), unless the authorization is terminated or revoked sooner.    Influenza A by PCR NEGATIVE NEGATIVE Final   Influenza B by PCR NEGATIVE NEGATIVE Final    Comment: (NOTE) The Xpert  Xpress SARS-CoV-2/FLU/RSV assay is intended as an aid in  the diagnosis of influenza from Nasopharyngeal swab specimens and  should not be used as a sole basis for treatment. Nasal washings and  aspirates are unacceptable for Xpert Xpress SARS-CoV-2/FLU/RSV  testing. Fact Sheet for Patients: https://www.moore.com/ Fact Sheet for Healthcare Providers: https://www.young.biz/ This test is not yet approved or cleared by the [place] FDA and  has been authorized for detection and/or diagnosis of SARS-CoV-2 by  FDA under an Emergency Use Authorization (EUA). This EUA will remain  in effect (meaning this test can be used) for the duration of the  Covid-19 declaration under Section 564(b)(1) of the Act, 21  U.S.C. section 360bbb-3(b)(1), unless the authorization is  terminated or revoked. Performed at [facility], [address] [place], [place] [address]      Labs: BNP (last 3 results) No results for input(s): BNP in the last 8760 hours. Basic Metabolic Panel: Recent Labs  Lab 10/26/19 0214  NA 137  K 4.2  CL 104  CO2 23  GLUCOSE 136*  BUN 27*  CREATININE 1.20  CALCIUM 9.5   Liver Function Tests: Recent Labs  Lab 10/26/19 0214  AST 51*  ALT 37  ALKPHOS 90  BILITOT 0.9  PROT 7.6  ALBUMIN 4.0   No results for input(s): LIPASE, AMYLASE in the last 168 hours. No results for input(s): AMMONIA in the last 168 hours. CBC: Recent Labs  Lab 10/26/19 0214  WBC 5.6  NEUTROABS 3.5  HGB 14.8  HCT 43.2  MCV 92.5  PLT 143*   Cardiac Enzymes: No results for input(s): CKTOTAL, CKMB, CKMBINDEX, TROPONINI in the last 168 hours. BNP: Invalid input(s): POCBNP CBG: Recent Labs  Lab 10/27/19 0742 10/27/19 1121 10/27/19 1634 10/27/19 2111 10/28/19 0734  GLUCAP 121* 134* 107* 120* 115*   D-Dimer No results for input(s): DDIMER in the last 72 hours. Hgb A1c Recent Labs    10/26/19 0214  HGBA1C 6.6*   Lipid  Profile Recent Labs    10/26/19 0214  CHOL 205*  HDL 56  LDLCALC 133*  TRIG 80  CHOLHDL 3.7   Thyroid function studies No results for input(s): TSH, T4TOTAL, T3FREE, THYROIDAB in the last 72 hours.  Invalid input(s): FREET3 Anemia work up No results for input(s): VITAMINB12, FOLATE, FERRITIN, TIBC, IRON, RETICCTPCT in the last 72 hours. Urinalysis    Component Value Date/Time   COLORURINE YELLOW (A) 10/26/2019 0340   APPEARANCEUR CLEAR (A) 10/26/2019 0340   LABSPEC  1.020 10/26/2019 0340   PHURINE 7.0 10/26/2019 0340   GLUCOSEU NEGATIVE 10/26/2019 0340   HGBUR NEGATIVE 10/26/2019 0340   BILIRUBINUR NEGATIVE 10/26/2019 0340   KETONESUR NEGATIVE 10/26/2019 0340   PROTEINUR NEGATIVE 10/26/2019 0340   NITRITE NEGATIVE 10/26/2019 0340   LEUKOCYTESUR NEGATIVE 10/26/2019 0340   Sepsis Labs Invalid input(s): PROCALCITONIN,  WBC,  LACTICIDVEN Microbiology Recent Results (from the past 240 hour(s))  Respiratory Panel by RT PCR (Flu A&B, Covid) - Nasopharyngeal Swab     Status: None   Collection Time: 10/26/19  4:50 AM   Specimen: Nasopharyngeal Swab  Result Value Ref Range Status   SARS Coronavirus 2 by RT PCR NEGATIVE NEGATIVE Final    Comment: (NOTE) SARS-CoV-2 target nucleic acids are NOT DETECTED. The SARS-CoV-2 RNA is generally detectable in upper respiratoy specimens during the acute phase of infection. The lowest concentration of SARS-CoV-2 viral copies this assay can detect is 131 copies/mL. A negative result does not preclude SARS-Cov-2 infection and should not be used as the sole basis for treatment or other patient management decisions. A negative result may occur with  improper specimen collection/handling, submission of specimen other than nasopharyngeal swab, presence of viral mutation(s) within the areas targeted by this assay, and inadequate number of viral copies (<131 copies/mL). A negative result must be combined with clinical observations, patient history,  and epidemiological information. The expected result is Negative. Fact Sheet for Patients:  https://www.moore.com/ Fact Sheet for Healthcare Providers:  https://www.young.biz/ This test is not yet ap proved or cleared by the [place] FDA and  has been authorized for detection and/or diagnosis of SARS-CoV-2 by FDA under an Emergency Use Authorization (EUA). This EUA will remain  in effect (meaning this test can be used) for the duration of the COVID-19 declaration under Section 564(b)(1) of the Act, 21 U.S.C. section 360bbb-3(b)(1), unless the authorization is terminated or revoked sooner.    Influenza A by PCR NEGATIVE NEGATIVE Final   Influenza B by PCR NEGATIVE NEGATIVE Final    Comment: (NOTE) The Xpert Xpress SARS-CoV-2/FLU/RSV assay is intended as an aid in  the diagnosis of influenza from Nasopharyngeal swab specimens and  should not be used as a sole basis for treatment. Nasal washings and  aspirates are unacceptable for Xpert Xpress SARS-CoV-2/FLU/RSV  testing. Fact Sheet for Patients: https://www.moore.com/ Fact Sheet for Healthcare Providers: https://www.young.biz/ This test is not yet approved or cleared by the [place] FDA and  has been authorized for detection and/or diagnosis of SARS-CoV-2 by  FDA under an Emergency Use Authorization (EUA). This EUA will remain  in effect (meaning this test can be used) for the duration of the  Covid-19 declaration under Section 564(b)(1) of the Act, 21  U.S.C. section 360bbb-3(b)(1), unless the authorization is  terminated or revoked. Performed at [facility], [address] [place], [place] [address]      Time coordinating discharge: 35 minutes  SIGNED:   Eddie North, MD  [facility] 10/28/2019, 10:55 AM Pager   If 7PM-7AM, please contact night-coverage [email or web address] Password [email or web address]

## 2019-10-29 ENCOUNTER — Inpatient Hospital Stay (HOSPITAL_COMMUNITY): Payer: PPO | Admitting: Physical Therapy

## 2019-10-29 ENCOUNTER — Inpatient Hospital Stay (HOSPITAL_COMMUNITY): Payer: PPO | Admitting: Occupational Therapy

## 2019-10-29 ENCOUNTER — Inpatient Hospital Stay (HOSPITAL_COMMUNITY): Payer: PPO | Admitting: Speech Pathology

## 2019-10-29 DIAGNOSIS — I6350 Cerebral infarction due to unspecified occlusion or stenosis of unspecified cerebral artery: Secondary | ICD-10-CM | POA: Diagnosis not present

## 2019-10-29 LAB — CBC WITH DIFFERENTIAL/PLATELET
Abs Immature Granulocytes: 0.06 10*3/uL (ref 0.00–0.07)
Basophils Absolute: 0.1 10*3/uL (ref 0.0–0.1)
Basophils Relative: 1 %
Eosinophils Absolute: 0.1 10*3/uL (ref 0.0–0.5)
Eosinophils Relative: 1 %
HCT: 46.6 % (ref 39.0–52.0)
Hemoglobin: 15.7 g/dL (ref 13.0–17.0)
Immature Granulocytes: 1 %
Lymphocytes Relative: 14 %
Lymphs Abs: 1.2 10*3/uL (ref 0.7–4.0)
MCH: 32.2 pg (ref 26.0–34.0)
MCHC: 33.7 g/dL (ref 30.0–36.0)
MCV: 95.7 fL (ref 80.0–100.0)
Monocytes Absolute: 1.3 10*3/uL — ABNORMAL HIGH (ref 0.1–1.0)
Monocytes Relative: 15 %
Neutro Abs: 6 10*3/uL (ref 1.7–7.7)
Neutrophils Relative %: 68 %
Platelets: 152 10*3/uL (ref 150–400)
RBC: 4.87 MIL/uL (ref 4.22–5.81)
RDW: 13.1 % (ref 11.5–15.5)
WBC: 8.7 10*3/uL (ref 4.0–10.5)
nRBC: 0 % (ref 0.0–0.2)

## 2019-10-29 LAB — COMPREHENSIVE METABOLIC PANEL
ALT: 43 U/L (ref 0–44)
AST: 62 U/L — ABNORMAL HIGH (ref 15–41)
Albumin: 3.6 g/dL (ref 3.5–5.0)
Alkaline Phosphatase: 60 U/L (ref 38–126)
Anion gap: 9 (ref 5–15)
BUN: 23 mg/dL (ref 8–23)
CO2: 20 mmol/L — ABNORMAL LOW (ref 22–32)
Calcium: 9.4 mg/dL (ref 8.9–10.3)
Chloride: 107 mmol/L (ref 98–111)
Creatinine, Ser: 1.22 mg/dL (ref 0.61–1.24)
GFR calc Af Amer: >60 mL/min (ref >60)
GFR calc non Af Amer: 56 mL/min — ABNORMAL LOW (ref >60)
Glucose, Bld: 122 mg/dL — ABNORMAL HIGH (ref 70–99)
Potassium: 4.3 mmol/L (ref 3.5–5.1)
Sodium: 136 mmol/L (ref 135–145)
Total Bilirubin: 1.7 mg/dL — ABNORMAL HIGH (ref 0.3–1.2)
Total Protein: 7.1 g/dL (ref 6.5–8.1)

## 2019-10-29 LAB — GLUCOSE, CAPILLARY
Glucose-Capillary: 117 mg/dL — ABNORMAL HIGH (ref 70–99)
Glucose-Capillary: 144 mg/dL — ABNORMAL HIGH (ref 70–99)
Glucose-Capillary: 170 mg/dL — ABNORMAL HIGH (ref 70–99)
Glucose-Capillary: 104 mg/dL — ABNORMAL HIGH (ref 70–99)

## 2019-10-29 NOTE — Progress Notes (Signed)
[place] PHYSICAL MEDICINE & REHABILITATION PROGRESS NOTE   Subjective/Complaints:  RIght toe pain stroke  ROS- neg CP SOB, N/V/D  Objective:   No results found. No results for input(s): WBC, HGB, HCT, PLT in the last 72 hours. No results for input(s): NA, K, CL, CO2, GLUCOSE, BUN, CREATININE, CALCIUM in the last 72 hours.  Intake/Output Summary (Last 24 hours) at 10/29/2019 1026 Last data filed at 10/29/2019 0557 Gross per 24 hour  Intake 237 ml  Output 1475 ml  Net -1238 ml     Physical Exam: Vital Signs Blood pressure (!) 130/44, pulse 73, temperature 98.6 F (37 C), resp. rate 16, height 5\' 11"  (1.803 m), weight 129.1 kg, SpO2 99 %.   General: No acute distress Mood and affect are appropriate Heart: Regular rate and rhythm no rubs murmurs or extra sounds Lungs: Clear to auscultation, breathing unlabored, no rales or wheezes Abdomen: Positive bowel sounds, soft nontender to palpation, nondistended Extremities: No clubbing, cyanosis, or edema Skin: No evidence of breakdown, no evidence of rash Neurologic: Cranial nerves II through XII intact, motor strength is 5/5 in bilateral deltoid, bicep, tricep, grip, hip flexor, knee extensors, ankle dorsiflexor and plantar flexor Sensory exam normal sensation to light touch and proprioception in bilateral upper and lower extremities Cerebellar exam normal finger to nose to finger as well as heel to shin in bilateral upper and lower extremities Musculoskeletal: Full range of motion in all 4 extremities. No joint swelling   Assessment/Plan: 1. Functional deficits secondary to bilateral CVA which require 3+ hours per day of interdisciplinary therapy in a comprehensive inpatient rehab setting.  Physiatrist is providing close team supervision and 24 hour management of active medical problems listed below.  Physiatrist and rehab team continue to assess barriers to discharge/monitor patient progress toward functional and medical  goals  Care Tool:  Bathing  Bathing activity did not occur: Safety/medical concerns           Bathing assist       Upper Body Dressing/Undressing Upper body dressing Upper body dressing/undressing activity did not occur (including orthotics): Refused What is the patient wearing?: Pull over shirt    Upper body assist      Lower Body Dressing/Undressing Lower body dressing      What is the patient wearing?: Incontinence brief     Lower body assist Assist for lower body dressing: Total Assistance - Patient < 25%     Toileting Toileting    Toileting assist Assist for toileting: Set up assist Assistive Device Comment: urinal   Transfers Chair/bed transfer  Transfers assist  Chair/bed transfer activity did not occur: Safety/medical concerns        Locomotion Ambulation   Ambulation assist              Walk 10 feet activity   Assist           Walk 50 feet activity   Assist           Walk 150 feet activity   Assist           Walk 10 feet on uneven surface  activity   Assist           Wheelchair     Assist               Wheelchair 50 feet with 2 turns activity    Assist            Wheelchair 150 feet activity  Assist          Blood pressure (!) 130/44, pulse 73, temperature 98.6 F (37 C), resp. rate 16, height 5\' 11"  (1.803 m), weight 129.1 kg, SpO2 99 %.  Medical Problem List and Plan: 1.  Right-sided weakness with dysarthria secondary to acute to subacute infarct multiple vascular territories involving bilateral brain parenchyma(left pons, inferior right cerebellum, left frontal lobe and right parietal lobe).  Small chronic right cerebellar infarct             -patient may  shower             -ELOS/Goals: 2- 2.5 weeks- goals Mod I to supervision 2.  Antithrombotics: -DVT/anticoagulation: Lovenox             -antiplatelet therapy: Aspirin 81 mg daily, Plavix 75 mg daily 3. Pain  Management: Neurontin 600 mg 3 times daily, Flexeril as needed 4. Mood: Provide emotional support             -antipsychotic agents: N/A 5. Neuropsych: This patient is capable of making decisions on his own behalf. 6. Skin/Wound Care: Routine skin checks 7. Fluids/Electrolytes/Nutrition: Routine in and outs with follow-up chemistries 8.  Diabetes mellitus with peripheral neuropathy.  Hemoglobin A1c 6.6.  SSI. CBG (last 3)  Recent Labs    10/28/19 1703 10/28/19 2100 10/29/19 0600  GLUCAP 126* 126* 117*   9.  Permissive hypertension.  Monitor with increased mobility.  Patient on Cozaar 50 mg daily, Maxide 37.5-25 mg daily prior to admission.  Resume as needed Vitals:   10/28/19 1951 10/29/19 0512  BP: 124/72 (!) 130/44  Pulse: 80 73  Resp: 16 16  Temp: 98.9 F (37.2 C) 98.6 F (37 C)  SpO2: 96% 99%   10.  Hyperlipidemia.  Lipitor 11. Gout flare- pt has had before- will start Colchicine 0.6 mg BID and also Prednisone 30 mg x3days - since Dm, won't do longer- will allow Dr 12/29/19 to decide overall plan.     LOS: 1 days A FACE TO FACE EVALUATION WAS PERFORMED  Wynn Banker 10/29/2019, 10:26 AM

## 2019-10-29 NOTE — Progress Notes (Signed)
Patient information reviewed and entered into [facility] by Becky Adalay Azucena, PPS coordinator. Information including medical coding, function ability, and quality indicators will be reviewed and updated through discharge.   

## 2019-10-29 NOTE — Evaluation (Signed)
Occupational Therapy Assessment and Plan  Patient Details  Name: Jordan Chang MRN: [id number] Date of Birth: August 29, 1940  OT Diagnosis: abnormal posture, hemiplegia affecting dominant side, muscle weakness (generalized) and coordination disorder Rehab Potential: Rehab Potential (ACUTE ONLY): Good ELOS: 2.5- 3 weeks   Today's Date: 10/29/2019 OT Individual Time: 2330-0762 and 1130-1155 OT Individual Time Calculation (min): 68 min   And 25 min  Problem List:  Patient Active Problem List   Diagnosis Date Noted  . Hyperlipidemia 10/28/2019  . Left pontine cerebrovascular accident ([place]) 10/28/2019  . Acute CVA (cerebrovascular accident) ([place]) 10/26/2019  . Type 2 diabetes mellitus with other specified complication ([place]) 26/33/3545  . Essential hypertension 10/26/2019  . Chronic back pain 01/22/2016  . Lumbar herniated disc 01/20/2016    Past Medical History:  Past Medical History:  Diagnosis Date  . GERD (gastroesophageal reflux disease)    occ  . Hyperlipemia   . Hypertension    Past Surgical History:  Past Surgical History:  Procedure Laterality Date  . BACK SURGERY  05/2011  . EYE SURGERY     cataracts  . LUMBAR LAMINECTOMY/DECOMPRESSION MICRODISCECTOMY Left 01/20/2016   Procedure: Left Lumbar two-three, Lumbar three-four,  Lumbar four-five Microdiskectomy;  Surgeon: Newman Pies, MD;  Location: [facility];  Service: Neurosurgery;  Laterality: Left;    Assessment & Plan Clinical Impression: Patient is a 79 y.o. year old male with history of hypertension, morbid obesity with BMI 39.51, diabetes mellitus with neuropathy, chronic back pain with lumbar laminectomy decompression 01/20/2016, hyperlipidemia.  Per chart review patient lives with spouse.  Independent with a straight point cane.  1 level home 3 steps to entry.  Family assistance as needed.  Presented to [facility] 10/26/2019 with slurred speech and right-sided weakness.  Admission chemistries with alcohol negative,  glucose 136, BUN 27, creatinine 1.20, urine drug screen negative.  Cranial CT scan negative for acute changes.  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  CT angiogram of head and neck no emergent large vessel occlusion or high-grade stenosis.  MRI of the brain showed few small acute to subacute infarcts multiple vascular territories.  Chronic microvascular ischemic changes.  Patient did not receive TPA.  Echocardiogram with ejection fraction of 65%.  Grade 1 diastolic dysfunction.  Lower extremity Doppler negative for DVT.  Currently on aspirin and Plavix for CVA prophylaxis.  Subcutaneous Lovenox for DVT prophylaxis.  Tolerating a regular diet.  Therapy evaluations completed and patient was admitted for a comprehensive rehab program. .  Patient transferred to CIR on 10/28/2019 .    Patient currently requires min - total A with basic self-care skills and IADL secondary to muscle weakness, decreased cardiorespiratoy endurance, decreased coordination and decreased motor planning, decreased initiation, decreased attention, decreased awareness, decreased problem solving and decreased safety awareness and decreased sitting balance, decreased standing balance, decreased postural control, hemiplegia and decreased balance strategies.  Prior to hospitalization, patient could complete ADLs and IADLs with modified independent .  Patient will benefit from skilled intervention to decrease level of assist with basic self-care skills prior to discharge home with care partner.  Anticipate patient will require 24 hour supervision and minimal physical assistance and follow up home health.  OT - End of Session Activity Tolerance: Decreased this session Endurance Deficit: Yes Endurance Deficit Description: frequent rest breaks OT Assessment Rehab Potential (ACUTE ONLY): Good OT Barriers to Discharge: Decreased caregiver support OT Patient demonstrates impairments in the following area(s):  Balance;Behavior;Cognition;Endurance;Motor;Pain;Safety OT Basic ADL's Functional Problem(s): Grooming;Bathing;Dressing;Toileting OT Transfers  Functional Problem(s): Toilet;Tub/Shower OT Additional Impairment(s): Fuctional Use of Upper Extremity OT Plan OT Intensity: Minimum of 1-2 x/day, 45 to 90 minutes OT Frequency: 5 out of 7 days OT Duration/Estimated Length of Stay: 2.5- 3 weeks OT Treatment/Interventions: Balance/vestibular training;DME/adaptive equipment instruction;Patient/family education;Therapeutic Activities;Wheelchair propulsion/positioning;Cognitive remediation/compensation;Psychosocial support;Therapeutic Exercise;Community reintegration;Functional mobility training;Self Care/advanced ADL retraining;UE/LE Strength taining/ROM;Discharge planning;Neuromuscular re-education;UE/LE Coordination activities;Pain management OT Self Feeding Anticipated Outcome(s): supervision OT Basic Self-Care Anticipated Outcome(s): S - min A OT Toileting Anticipated Outcome(s): min A OT Bathroom Transfers Anticipated Outcome(s): min A OT Recommendation Recommendations for Other Services: Neuropsych consult Patient destination: Home Follow Up Recommendations: 24 hour supervision/assistance;Home health OT Equipment Recommended: To be determined   Skilled Therapeutic Intervention Session 1: Upon entering the room, pt supine in bed with no c/o pain. Pt required mod A for supine >sit to EOB. Pt required total A to thread pants onto B LEs and max A to stand while therapist pulled clothing over B hips. Pt unable to do stand pivot secondary to increased pain in R ankle/foot with weight bearing. Pt standing into stedy with max A as well. Pt assisted to mirror while in stedy for grooming tasks with use of mirror for visual feed back with pt needing min cuing to return to midline secondary to R lateral lean. OT assisted pt into recliner chair from stedy with max A. OT educated pt on OT purpose, POC, and goals with  pt verbalizing understanding and agreement. Pt seated on chair alarm with call bell and all needed items within reach.   Session 2: Upon entering the room, pt seated in recliner chair with pt's wife now present. OT reviewed findings from morning session with caregiver and recommendation for 24/7 supervision and min A for safety. She verbalized understanding. Pt requesting to attempt toileting this session secondary to increased constipation. Pt standing from recliner chair into stedy with max A and OT assisted pt to bathroom via stedy. Pt required assistance to push clothing down before sitting on BSC. Pt still attempting to use at end of session and left on commode with stedy in front of him, RN notified, and caregiver present with pt.   OT Evaluation Precautions/Restrictions  Precautions Precautions: Fall Restrictions Weight Bearing Restrictions: No Pain Pain Assessment Pain Scale: 0-10 Pain Score: 8  Faces Pain Scale: Hurts a little bit Pain Type: Acute pain Pain Location: Foot Pain Orientation: Right Pain Descriptors / Indicators: Discomfort;Aching Pain Onset: On-going Patients Stated Pain Goal: 2 Pain Intervention(s): RN made aware;Repositioned;Distraction Multiple Pain Sites: No Home Living/Prior Functioning Home Living Family/patient expects to be discharged to:: Private residence Living Arrangements: Spouse/significant other Available Help at Discharge: Family, Available 24 hours/day Type of Home: House Home Access: Stairs to enter [employer] of Steps: 3 (from car-port) Entrance Stairs-Rails: Right Home Layout: One level Bathroom Shower/Tub: Multimedia programmer: Standard Bathroom Accessibility: Yes  Lives With: Spouse Prior Function Level of Independence: Requires assistive device for independence Vocation: Retired Comments: Ambulates with SPC. Vision Baseline Vision/History: Wears glasses Wears Glasses: Reading only Patient Visual Report: No  change from baseline Vision Assessment?: No apparent visual deficits Cognition Overall Cognitive Status: Impaired/Different from baseline Arousal/Alertness: Awake/alert Orientation Level: Person;Place;Situation Person: Oriented Place: Oriented Situation: Oriented Year: 2021 Month: May Day of Week: Correct Memory: Impaired Memory Impairment: Decreased short term memory;Decreased recall of new information Decreased Short Term Memory: Verbal basic;Functional basic Immediate Memory Recall: Sock;Blue;Bed Memory Recall Sock: Without Cue Memory Recall Blue: With Cue Memory Recall Bed: With Cue Attention: Sustained Sustained Attention:  Appears intact Awareness: Impaired Awareness Impairment: Anticipatory impairment Problem Solving: Impaired Problem Solving Impairment: Verbal complex Executive Function: Reasoning Reasoning: Impaired Reasoning Impairment: Verbal basic Sensation Sensation Light Touch: Appears Intact Hot/Cold: Appears Intact Proprioception: Appears Intact Stereognosis: Not tested Coordination Gross Motor Movements are Fluid and Coordinated: No Fine Motor Movements are Fluid and Coordinated: No Coordination and Movement Description: impaired 2/2 R hemi Motor  Motor Motor: Hemiplegia;Abnormal tone Motor - Skilled Clinical Observations: R hemi Mobility  Bed Mobility Bed Mobility: Rolling Right;Rolling Left;Supine to Sit;Sit to Supine Rolling Right: Minimal Assistance - Patient > 75% Rolling Left: Minimal Assistance - Patient > 75% Supine to Sit: Moderate Assistance - Patient 50-74% Sit to Supine: Moderate Assistance - Patient 50-74% Transfers Sit to Stand: Maximal Assistance - Patient 25-49%  Trunk/Postural Assessment  Cervical Assessment Cervical Assessment: Exceptions to WFL(forward head) Thoracic Assessment Thoracic Assessment: Exceptions to WFL(rounded shoulders) Lumbar Assessment Lumbar Assessment: Exceptions to WFL(posterior pelvic tilt) Postural  Control Postural Control: Deficits on evaluation  Balance Balance Balance Assessed: Yes Static Sitting Balance Static Sitting - Balance Support: Bilateral upper extremity supported;Feet supported Static Sitting - Level of Assistance: 5: Stand by assistance Dynamic Sitting Balance Dynamic Sitting - Balance Support: No upper extremity supported;Feet supported;During functional activity Dynamic Sitting - Level of Assistance: 4: Min assist Static Standing Balance Static Standing - Balance Support: Bilateral upper extremity supported;During functional activity Static Standing - Level of Assistance: 4: Min assist Extremity/Trunk Assessment RUE Assessment RUE Assessment: Exceptions to [facility] Passive Range of Motion (PROM) Comments: WFLs Active Range of Motion (AROM) Comments: WFLs General Strength Comments: 3-/5 throughout LUE Assessment LUE Assessment: Within Functional Limits     Refer to Care Plan for Long Term Goals  Recommendations for other services: Neuropsych   Discharge Criteria: Patient will be discharged from OT if patient refuses treatment 3 consecutive times without medical reason, if treatment goals not met, if there is a change in medical status, if patient makes no progress towards goals or if patient is discharged from hospital.  The above assessment, treatment plan, treatment alternatives and goals were discussed and mutually agreed upon: by patient  Gypsy Decant 10/29/2019, 12:50 PM

## 2019-10-29 NOTE — Progress Notes (Signed)
PRN Sorbitol 30 mL administered this morning at 6:14 AM due to last bowel movement was 10/25/19. Result still pending. Night shift nurse reported to day shift nurse to continue to monitor patient for BM output.

## 2019-10-29 NOTE — Care Management (Signed)
[facility] Individual Statement of Services  Patient Name:  Jordan Chang  Date:  10/29/2019  Welcome to the [facility].  Our goal is to provide you with an individualized program based on your diagnosis and situation, designed to meet your specific needs.  With this comprehensive rehabilitation program, you will be expected to participate in at least 3 hours of rehabilitation therapies Monday-Friday, with modified therapy programming on the weekends.  Your rehabilitation program will include the following services:  Physical Therapy (PT), Occupational Therapy (OT), Speech Therapy (ST), 24 hour per day rehabilitation nursing, Therapeutic Recreaction (TR), Neuropsychology, Case Management (Social Worker), Rehabilitation Medicine, Nutrition Services and Pharmacy Services  Weekly team conferences will be held on Wednesdays to discuss your progress.  Your Social Worker will talk with you frequently to get your input and to update you on team discussions.  Team conferences with you and your family in attendance may also be held.  Expected length of stay: 2-2.5 Weeks  Overall anticipated outcome: MOD I to Supervision  Depending on your progress and recovery, your program may change. Your Social Worker will coordinate services and will keep you informed of any changes. Your Social Worker's name and contact numbers are listed  below.  The following services may also be recommended but are not provided by the [facility]:    Home Health Rehabiltiation Services  Outpatient Rehabilitation Services    Arrangements will be made to provide these services after discharge if needed.  Arrangements include referral to agencies that provide these services.  Your insurance has been verified to be:  [employer]  Your primary doctor is:  Barbette Reichmann, MD  Pertinent information will be shared with your doctor and your insurance  company.  Social Worker:  Lavera Guise, [place] [phone] or [phone]   Information discussed with and copy given to patient by: [name], 10/29/2019, 9:30 AM

## 2019-10-29 NOTE — Evaluation (Signed)
Physical Therapy Assessment and Plan  Patient Details  Name: Jordan Chang MRN: [id number] Date of Birth: 1941-05-21  PT Diagnosis: Difficulty walking, Hemiplegia dominant and Muscle weakness Rehab Potential: Good ELOS: 18-21 days   Today's Date: 10/29/2019 PT Individual Time: 1300-1415 PT Individual Time Calculation (min): 75 min    Problem List:  Patient Active Problem List   Diagnosis Date Noted  . Hyperlipidemia 10/28/2019  . Left pontine cerebrovascular accident ([place]) 10/28/2019  . Acute CVA (cerebrovascular accident) ([place]) 10/26/2019  . Type 2 diabetes mellitus with other specified complication ([place]) 26/71/2458  . Essential hypertension 10/26/2019  . Chronic back pain 01/22/2016  . Lumbar herniated disc 01/20/2016    Past Medical History:  Past Medical History:  Diagnosis Date  . GERD (gastroesophageal reflux disease)    occ  . Hyperlipemia   . Hypertension    Past Surgical History:  Past Surgical History:  Procedure Laterality Date  . BACK SURGERY  05/2011  . EYE SURGERY     cataracts  . LUMBAR LAMINECTOMY/DECOMPRESSION MICRODISCECTOMY Left 01/20/2016   Procedure: Left Lumbar two-three, Lumbar three-four,  Lumbar four-five Microdiskectomy;  Surgeon: Newman Pies, MD;  Location: [facility];  Service: Neurosurgery;  Laterality: Left;    Assessment & Plan Clinical Impression:  Jordan Chang is an 79 year old right-handed male with history of hypertension, morbid obesity with BMI 39.51, diabetes mellitus with neuropathy, chronic back pain with lumbar laminectomy decompression 01/20/2016, hyperlipidemia.  Per chart review patient lives with spouse.  Independent with a straight point cane.  1 level home 3 steps to entry.  Family assistance as needed.  Presented to [facility] 10/26/2019 with slurred speech and right-sided weakness.  Admission chemistries with alcohol negative, glucose 136, BUN 27, creatinine 1.20, urine drug screen negative.  Cranial CT scan negative for  acute changes.  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  CT angiogram of head and neck no emergent large vessel occlusion or high-grade stenosis.  MRI of the brain showed few small acute to subacute infarcts multiple vascular territories.  Chronic microvascular ischemic changes.  Patient did not receive TPA.  Echocardiogram with ejection fraction of 65%.  Grade 1 diastolic dysfunction.  Lower extremity Doppler negative for DVT.  Currently on aspirin and Plavix for CVA prophylaxis.  Subcutaneous Lovenox for DVT prophylaxis.  Tolerating a regular diet.  Therapy evaluations completed and patient was admitted for a comprehensive rehab program.  Patient transferred to CIR on 10/28/2019 .   Patient currently requires max with mobility secondary to muscle weakness, decreased cardiorespiratoy endurance, abnormal tone and unbalanced muscle activation, decreased awareness, decreased problem solving, decreased safety awareness and decreased memory and decreased sitting balance, decreased standing balance, decreased postural control, hemiplegia and decreased balance strategies.  Prior to hospitalization, patient was modified independent  with mobility and lived with Spouse in a House home.  Home access is 3 (from car-port)Stairs to enter.  Patient will benefit from skilled PT intervention to maximize safe functional mobility, minimize fall risk and decrease caregiver burden for planned discharge home with 24 hour assist.  Anticipate patient will benefit from follow up [facility] at discharge.  PT - End of Session Activity Tolerance: Tolerates 30+ min activity with multiple rests Endurance Deficit: Yes Endurance Deficit Description: frequent rest breaks PT Assessment Rehab Potential (ACUTE/IP ONLY): Good PT Barriers to Discharge: Home environment access/layout PT Patient demonstrates impairments in the following area(s): Balance;Endurance;Motor;Pain;Safety PT Transfers Functional Problem(s): Bed  Mobility;Bed to Chair;Car;Furniture;Floor PT Locomotion Functional Problem(s): Ambulation;Wheelchair Mobility;Stairs PT Plan  PT Intensity: Minimum of 1-2 x/day ,45 to 90 minutes PT Frequency: 5 out of 7 days PT Duration Estimated Length of Stay: 18-21 days PT Treatment/Interventions: Ambulation/gait training;Balance/vestibular training;Community reintegration;Discharge planning;Disease management/prevention;DME/adaptive equipment instruction;Functional electrical stimulation;Functional mobility training;Neuromuscular re-education;Pain management;Patient/family education;Psychosocial support;Splinting/orthotics;Stair training;Therapeutic Activities;Therapeutic Exercise;UE/LE Strength taining/ROM;UE/LE Coordination activities;Wheelchair propulsion/positioning PT Transfers Anticipated Outcome(s): min A PT Locomotion Anticipated Outcome(s): min A with LRAD PT Recommendation Recommendations for Other Services: Neuropsych consult;Therapeutic Recreation consult Therapeutic Recreation Interventions: Stress management Follow Up Recommendations: Home health PT;24 hour supervision/assistance Patient destination: Home Equipment Recommended: Rolling walker with 5" wheels;Wheelchair (measurements);Wheelchair cushion (measurements) Equipment Details: wide RW; 20x18 w/c  Skilled Therapeutic Intervention Evaluation completed (see details above and below) with education on PT POC and goals and individual treatment initiated with focus on functional transfer assessment, orientation to rehab unit and schedule, discussion with pt and wife about goals and expected LOS. Pt received seated in recliner in room finishing lunch, wife present able to assist with providing PLOF and home setup. Pt reports pain in R foot from gout at rest, not rated but tender to the touch. Pt is max A to stand from recliner to stedy. Stedy transfer to bed. Pt is min A to stand from elevated bed to stedy. Stedy transfer to w/c. Manual w/c  propulsion x 25 ft with use of BUE and LLE with min A needed for steering due to decreased grip strength and ability to steer with RUE. Sit to stand in // bars with mod to max A. Once in standing pt exhibits lateral lean to the L with decreased WBing on RLE. Pt attempts to take a few steps in the // bars, R knee buckles. Pt returned to sitting with max A for safety. Stedy transfer back to recliner at end of session. Pt frequently impulsive throughout session with cues needed for safety. Utilized amplifier throughout session for increased understanding of cues as pt is severely HOH. Pt also becomes emotional and tearful at times during the session, provided emotional support. Pt left seated in recliner in room with needs in reach, chair alarm in place at end of session.  PT Evaluation Precautions/Restrictions Precautions Precautions: Fall Restrictions Weight Bearing Restrictions: No Home Living/Prior Functioning Home Living Available Help at Discharge: Family;Available 24 hours/day Type of Home: House Home Access: Stairs to enter [employer] of Steps: 3 (from car-port) Entrance Stairs-Rails: Right Home Layout: One level Bathroom Shower/Tub: Multimedia programmer: Standard Bathroom Accessibility: Yes  Lives With: Spouse Prior Function Level of Independence: Independent with gait;Independent with transfers;Requires assistive device for independence  Able to Take Stairs?: Yes Vocation: Retired Comments: Ambulates with SPC. Vision/Perception  Perception Perception: Within Functional Limits Praxis Praxis: Intact  Cognition Overall Cognitive Status: Impaired/Different from baseline Arousal/Alertness: Awake/alert Orientation Level: Oriented X4 Attention: Sustained Sustained Attention: Appears intact Memory: Impaired Memory Impairment: Decreased short term memory;Decreased recall of new information Decreased Short Term Memory: Verbal basic;Functional basic Immediate  Memory Recall: Sock;Blue;Bed Memory Recall Sock: Without Cue Memory Recall Blue: With Cue Memory Recall Bed: With Cue Awareness: Impaired Awareness Impairment: Anticipatory impairment Problem Solving: Impaired Problem Solving Impairment: Verbal complex Executive Function: Reasoning Reasoning: Impaired Reasoning Impairment: Verbal basic Behaviors: Impulsive;Lability Safety/Judgment: Impaired Comments: emotionally labile throughout session, tearful at times Sensation Sensation Light Touch: Appears Intact Proprioception: Appears Intact Coordination Gross Motor Movements are Fluid and Coordinated: No Fine Motor Movements are Fluid and Coordinated: No Coordination and Movement Description: impaired 2/2 R hemi Motor  Motor Motor: Hemiplegia;Abnormal tone Motor - Skilled Clinical Observations: R hemi  Mobility Bed Mobility Bed Mobility: Rolling Right;Rolling Left;Supine to Sit;Sit to Supine Rolling Right: Minimal Assistance - Patient > 75% Rolling Left: Minimal Assistance - Patient > 75% Supine to Sit: Moderate Assistance - Patient 50-74% Sit to Supine: Moderate Assistance - Patient 50-74% Transfers Transfers: Sit to [employer] via Geophysicist/field seismologist Sit to Stand: Maximal Assistance - Patient 25-49% Transfer (Assistive device): Other (Comment)(to stedy) Transfer via Lift Equipment: Haematologist / Therapist, nutritional Stairs: No Architect: Yes Wheelchair Assistance: Minimal assistance - Patient >75% Wheelchair Propulsion: Both upper extremities;Left lower extremity Wheelchair Parts Management: Needs assistance Distance: 25  Trunk/Postural Assessment  Cervical Assessment Cervical Assessment: Exceptions to WFL(forward head) Thoracic Assessment Thoracic Assessment: Exceptions to WFL(rounded shoulders) Lumbar Assessment Lumbar Assessment: Exceptions to WFL(posterior pelvic tilt) Postural Control Postural Control: Deficits on  evaluation(L lateral lean in standing)  Balance Balance Balance Assessed: Yes Static Sitting Balance Static Sitting - Balance Support: Bilateral upper extremity supported;Feet supported Static Sitting - Level of Assistance: 5: Stand by assistance Dynamic Sitting Balance Dynamic Sitting - Balance Support: No upper extremity supported;Feet supported;During functional activity Dynamic Sitting - Level of Assistance: 4: Min assist Static Standing Balance Static Standing - Balance Support: Bilateral upper extremity supported;During functional activity Static Standing - Level of Assistance: 4: Min assist Extremity Assessment   RLE Assessment RLE Assessment: Exceptions to [facility] General Strength Comments: impaired, see below RLE Strength Right Hip Flexion: 3-/5 Right Knee Flexion: 3/5 Right Knee Extension: 3/5 Right Ankle Dorsiflexion: 1/5(painful) LLE Assessment LLE Assessment: Within Functional Limits General Strength Comments: 5/5 grossly, 4/5 PF    Refer to Care Plan for Long Term Goals  Recommendations for other services: Neuropsych and Therapeutic Recreation  Stress management  Discharge Criteria: Patient will be discharged from PT if patient refuses treatment 3 consecutive times without medical reason, if treatment goals not met, if there is a change in medical status, if patient makes no progress towards goals or if patient is discharged from hospital.  The above assessment, treatment plan, treatment alternatives and goals were discussed and mutually agreed upon: by patient and by family   Excell Seltzer, PT, DPT 10/29/2019, 4:10 PM

## 2019-10-29 NOTE — Progress Notes (Signed)
Social Work Patient ID: Jordan Chang, male   DOB: 16-Dec-1940, 79 y.o.   MRN: [id number]  Sw entered room. Patient sitting up eating in wheelchair, spouse at bedside. SW provided team conference updates. Provided information from: MD, nursing and therapy. Sw will follow up with questions or concerns.

## 2019-10-29 NOTE — Patient Care Conference (Signed)
Inpatient RehabilitationTeam Conference and Plan of Care Update Date: 10/29/2019   Time: 10:25 AM    Patient Name: Jordan Chang      Medical Record Number: [id number]  Date of Birth: [birth year] Sex: Male         Room/Bed: 4W25C/4W25C-01 Payor Info: Payor: [employer] / Plan: [employer] Must PPO / Product Type: *No Product type* /    Admit Date/Time:  10/28/2019 12:19 PM  Primary Diagnosis:  Left pontine cerebrovascular accident [facility])  Patient Active Problem List   Diagnosis Date Noted  . Hyperlipidemia 10/28/2019  . Left pontine cerebrovascular accident ([place]) 10/28/2019  . Acute CVA (cerebrovascular accident) ([place]) 10/26/2019  . Type 2 diabetes mellitus with other specified complication ([place]) 78/29/5621  . Essential hypertension 10/26/2019  . Chronic back pain 01/22/2016  . Lumbar herniated disc 01/20/2016    Expected Discharge Date: Expected Discharge Date: (Estimated LOS 2 1/2 to 3 weeks)  Team Members Present: Physician leading conference: Dr. Alysia Penna Care Coodinator Present: Karene Fry, RN, MSN;Christina Sampson Goon, BSW;Deborah Tobin Chad, RN, BSN, CRRN Nurse Present: Rosita Fire, RN PT Present: Barrie Folk, PT;[name], PTA OT Present: Darleen Crocker, OT SLP Present: Jettie Booze, CF-SLP PPS Coordinator present : Ileana Ladd, Burna Mortimer, SLP     Current Status/Progress Goal Weekly Team Focus  Bowel/Bladder   patient is continent of bowel and bladder. LBM 10/25/19  maintain continence  assess bowel sounds and monitoring for bowel output and assess patient toileting needs   Swallow/Nutrition/ Hydration             ADL's   UB mod A, LB total A, functional transfers with stedy, toileting total A  min A overall  R NMR, ADL retraining, balance, functional transfers, endurance, pt/family edu   Mobility   Eval Pending.  Eval pending.      Communication   eval pending         Safety/Cognition/ Behavioral Observations  eval pending          Pain   patient c/o R ankle pain due to gout  pain level <3 with or without activity  Q shift and PRN pain assessment   Skin   patient has no skin issue  maintain good skin condition  q shift and PRN skin assessment    Rehab Goals Patient on target to meet rehab goals: Yes Rehab Goals Revised: Goals being evaluated *See Care Plan and progress notes for long and short-term goals.     Barriers to Discharge  Current Status/Progress Possible Resolutions Date Resolved   Nursing                  PT  Home environment access/layout                 OT Decreased caregiver support                SLP                SW Medical stability   On target          Discharge Planning/Teaching Needs:  Goal to discharge home with spouse  Will schedule education if needed   Team Discussion: MD L pontine CVA, h/o CVA, HTN, DM, gout, on prednisone.  RN BS 117, gout ankle and foot, cont B/B, BM 5/1, sorbitol given.  OT stood max A, Total A LB dressing.  PT eval pending.  SLP eval pending for higher level judgment.  SW wife has back problems; can  provide S level.   Revisions to Treatment Plan: N/A     Medical Summary Current Status: Left pontine infarct as well as cerebellar infarct and right parietal infarct, gallop with right first MTP pain Weekly Focus/Goal: Treatment of gout flareup  Barriers to Discharge: Medical stability;Other (comments)  Barriers to Discharge Comments: Pain from gout flare Possible Resolutions to Barriers: See above initiate rehabilitation program   Continued Need for Acute Rehabilitation Level of Care: The patient requires daily medical management by a physician with specialized training in physical medicine and rehabilitation for the following reasons: Direction of a multidisciplinary physical rehabilitation program to maximize functional independence : Yes Medical management of patient stability for increased activity during participation in an intensive  rehabilitation regime.: Yes Analysis of laboratory values and/or radiology reports with any subsequent need for medication adjustment and/or medical intervention. : Yes   I attest that I was present, lead the team conference, and concur with the assessment and plan of the team.   Trish Mage 10/30/2019, 5:00 PM   Team conference was held via web/ teleconference due to COVID - 19

## 2019-10-29 NOTE — Progress Notes (Signed)
Social Work Assessment and Plan   Patient Details  Name: Jordan Chang MRN: [id number] Date of Birth: 1941/01/05  Today's Date: 10/29/2019  Problem List:  Patient Active Problem List   Diagnosis Date Noted  . Hyperlipidemia 10/28/2019  . Left pontine cerebrovascular accident ([facility]) 10/28/2019  . Acute CVA (cerebrovascular accident) ([facility]) 10/26/2019  . Type 2 diabetes mellitus with other specified complication ([facility]) 10/26/2019  . Essential hypertension 10/26/2019  . Chronic back pain 01/22/2016  . Lumbar herniated disc 01/20/2016   Past Medical History:  Past Medical History:  Diagnosis Date  . GERD (gastroesophageal reflux disease)    occ  . Hyperlipemia   . Hypertension    Past Surgical History:  Past Surgical History:  Procedure Laterality Date  . BACK SURGERY  05/2011  . EYE SURGERY     cataracts  . LUMBAR LAMINECTOMY/DECOMPRESSION MICRODISCECTOMY Left 01/20/2016   Procedure: Left Lumbar two-three, Lumbar three-four,  Lumbar four-five Microdiskectomy;  Surgeon: Tressie Stalker, MD;  Location: [facility];  Service: Neurosurgery;  Laterality: Left;   Social History:  reports that he has never smoked. He has never used smokeless tobacco. He reports previous alcohol use. He reports that he does not use drugs.  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 Read: Yes Write: Yes Employment Status: Retired(Retired 2002)   Abuse/Neglect Abuse/Neglect Assessment Can Be Completed: Yes Physical Abuse: Denies Verbal Abuse: Denies Sexual Abuse:  Denies Exploitation of patient/patient's resources: Denies Self-Neglect: Denies  Emotional Status Pt's affect, behavior and adjustment status: Spouse states she can really see a difference in patient, he has been great Recent Psychosocial Issues: no Psychiatric History: no Substance Abuse History: no  Patient / Family Perceptions, Expectations & Goals Pt/Family understanding of illness & functional limitations: Patient and family understanding due to past health history Pt/family expectations/goals: Goal is for patient to discharge back home with spouse  [employer]: None Premorbid Home Care/DME Agencies: None Transportation available at discharge: Family able to transport  Discharge Planning Living Arrangements: Spouse/significant other Support Systems: Children, Spouse/significant other Type of Residence: Private residence Insurance Resources: Media planner (specify)(Health [employer]vantage) Financial Resources: Restaurant manager, fast food Screen Referred: Yes Living Expenses: Mortgage Money Management: Patient, Spouse Does the patient have any problems obtaining your medications?: No Social Work Anticipated Follow Up Needs: HH/OP  Clinical Impression SW called patient spouse. SW introduced self and explained role. SW explained process. Will follow up with questions and concerns. Patient is not a veteran  [name] 10/29/2019, 9:29 AM

## 2019-10-29 NOTE — Evaluation (Signed)
Speech Language Pathology Assessment and Plan  Patient Details  Name: Jordan Chang MRN: [id number] Date of Birth: 1940/12/15  SLP Diagnosis: Cognitive Impairments  Rehab Potential: Good ELOS: 1.5-2 weeks    Today's Date: 10/29/2019 SLP Individual Time: 2248-2500 SLP Individual Time Calculation (min): 56 min   Problem List:  Patient Active Problem List   Diagnosis Date Noted  . Hyperlipidemia 10/28/2019  . Left pontine cerebrovascular accident ([place]) 10/28/2019  . Acute CVA (cerebrovascular accident) ([place]) 10/26/2019  . Type 2 diabetes mellitus with other specified complication ([place]) 37/09/8887  . Essential hypertension 10/26/2019  . Chronic back pain 01/22/2016  . Lumbar herniated disc 01/20/2016   Past Medical History:  Past Medical History:  Diagnosis Date  . GERD (gastroesophageal reflux disease)    occ  . Hyperlipemia   . Hypertension    Past Surgical History:  Past Surgical History:  Procedure Laterality Date  . BACK SURGERY  05/2011  . EYE SURGERY     cataracts  . LUMBAR LAMINECTOMY/DECOMPRESSION MICRODISCECTOMY Left 01/20/2016   Procedure: Left Lumbar two-three, Lumbar three-four,  Lumbar four-five Microdiskectomy;  Surgeon: Newman Pies, MD;  Location: [facility];  Service: Neurosurgery;  Laterality: Left;    Assessment / Plan / Recommendation Clinical Impression   HPI: Jordan Chang is a 79 year old right-handed male with history of hypertension, morbid obesity with BMI 39.51, diabetes mellitus with neuropathy, chronic back pain with lumbar laminectomy decompression 01/20/2016, hyperlipidemia.  Per chart review patient lives with spouse.  Independent with a straight point cane.  1 level home 3 steps to entry.  Family assistance as needed.  Presented to [facility] 10/26/2019 with slurred speech and right-sided weakness.  Admission chemistries with alcohol negative, glucose 136, BUN 27, creatinine 1.20, urine drug screen negative.  Cranial CT scan negative for acute  changes.  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  CT angiogram of head and neck no emergent large vessel occlusion or high-grade stenosis.  MRI of the brain showed few small acute to subacute infarcts multiple vascular territories.  Chronic microvascular ischemic changes.  Patient did not receive TPA.  Echocardiogram with ejection fraction of 65%.  Grade 1 diastolic dysfunction.  Lower extremity Doppler negative for DVT.  Currently on aspirin and Plavix for CVA prophylaxis.  Subcutaneous Lovenox for DVT prophylaxis.  Tolerating a regular diet.  Therapy evaluations completed and patient was admitted for a comprehensive rehab program 10/28/19 and SLP evaluation was completed 10/29/19 with results as follows:  Although no family was present to verify baseline cognitive-linguistic function, pt presents with mild cognitive deficits primarily impacting his anticipatory awareness, complex problem solving, judgement, and short term memory. Pt is hard of hearing (HOH) and reports he uses lip reading to compensate, however due to mask mandate that is not an appropriate strategy at this time. Voice amplifier was successfully used during evaluation and left with pt to use during admission. Pt was oriented X4 and able to identify some physical impairments, however when questioned, he demonstrated poor verbal judgement and reduced ability to anticipate tasks he would need assistance with at discharge (I.e. stated he would be able to go home and mow his yard). Cognistat evaluation scores that fell outside of normal limits included visual construction (3-mild), short term memory (5-moderately severe), and judgement (3-mild). Pt's speech was very mildly dysarthric but hyponasality did improve with use of voice amplifier, and per pt's report has improved drastically over the last few days. He was 95% intelligible during evaluation, and pt feels  speech is back to baseline; there is no functional impact of  dysarthria on his communication. Expressive and receptive language was [facility] for all tasks.   Recommend pt receive skilled ST to address higher level cognitive deficits previously described in order to maximize his functional independence and safety prior to discharge.    Skilled Therapeutic Interventions          Cognitive-linguistic evaluation was administered and results were reviewed with pt (please see above for details regarding results).   SLP Assessment  Patient will need skilled [place] [facility] during CIR admission    Recommendations  Oral Care Recommendations: Oral care BID Patient destination: Home Follow up Recommendations: 24 hour supervision/assistance;Other (comment)(follow up ST TBD depending on progress) Equipment Recommended: None recommended by SLP    SLP Frequency 3 to 5 out of 7 days   SLP Duration  SLP Intensity  SLP Treatment/Interventions 1.5-2 weeks  Minumum of 1-2 x/day, 30 to 90 minutes  Cognitive remediation/compensation;Cueing hierarchy;Functional tasks;Patient/family education;Internal/external aids    Pain Pain Assessment Pain Scale: Faces Pain Score: 4  Faces Pain Scale: Hurts a little bit Pain Type: Acute pain Pain Location: Foot Pain Orientation: Right Pain Descriptors / Indicators: Discomfort Pain Onset: On-going Pain Intervention(s): Distraction;Repositioned Multiple Pain Sites: No     SLP Evaluation Cognition Overall Cognitive Status: Impaired/Different from baseline Arousal/Alertness: Awake/alert Orientation Level: Oriented X4 Attention: Sustained Sustained Attention: Appears intact Memory: Impaired Memory Impairment: Decreased short term memory;Decreased recall of new information Decreased Short Term Memory: Verbal basic;Functional basic Awareness: Impaired Awareness Impairment: Anticipatory impairment Problem Solving: Impaired Problem Solving Impairment: Verbal complex Executive Function:  Reasoning Reasoning: Impaired Reasoning Impairment: Verbal basic  Comprehension Auditory Comprehension Overall Auditory Comprehension: Appears within functional limits for tasks assessed Yes/No Questions: Within Functional Limits Commands: Within Functional Limits Conversation: Complex Other Conversation Comments: Pt is hard of hearing and reports he does not use his hearing aids due to discomfort - he uses lip reading - voice amplifier can be used while inpatient given mask mandate Interfering Components: Hearing Visual Recognition/Discrimination Discrimination: Within Function Limits Reading Comprehension Reading Status: Not tested Expression Verbal Expression Overall Verbal Expression: Appears within functional limits for tasks assessed Written Expression Written Expression: Not tested Oral Motor Oral Motor/Sensory Function Overall Oral Motor/Sensory Function: Mild impairment Facial ROM: Within Functional Limits Facial Symmetry: Abnormal symmetry right Facial Strength: Reduced right Facial Sensation: Within Functional Limits Lingual ROM: Within Functional Limits Lingual Symmetry: Within Functional Limits Lingual Strength: Reduced Velum: Within Functional Limits Mandible: Within Functional Limits Motor Speech Overall Motor Speech: Impaired(but 95% intelligible in conversation) Respiration: Within functional limits Phonation: Normal Resonance: Hyponasality Articulation: Impaired Level of Impairment: Conversation Intelligibility: Intelligible Word: 75-100% accurate Phrase: 75-100% accurate Sentence: 75-100% accurate Conversation: 75-100% accurate Motor Planning: Witnin functional limits Motor Speech Errors: Not applicable Interfering Components: Hearing loss   Intelligibility: Intelligible Word: 75-100% accurate Phrase: 75-100% accurate Sentence: 75-100% accurate Conversation: 75-100% accurate    Short Term Goals: Week 1: SLP Short Term Goal 1 (Week 1): Pt  will recall functional daily and new information with Min A for use of aids or strategies. SLP Short Term Goal 2 (Week 1): Pt will demonstrate ability to problem solve mildly complex functional tasks with Min A verbal/visual cues. SLP Short Term Goal 3 (Week 1): Pt will anticipate 2 activities he will need assistance with at home (based on current deficits) with Mod A verbal/visual cues.  Refer to Care Plan for Long Term Goals  Recommendations for other services: None  Discharge Criteria: Patient will be discharged from SLP if patient refuses treatment 3 consecutive times without medical reason, if treatment goals not met, if there is a change in medical status, if patient makes no progress towards goals or if patient is discharged from hospital.  The above assessment, treatment plan, treatment alternatives and goals were discussed and mutually agreed upon: by patient  Arbutus Leas 10/29/2019, 12:22 PM

## 2019-10-30 ENCOUNTER — Inpatient Hospital Stay (HOSPITAL_COMMUNITY): Payer: PPO

## 2019-10-30 ENCOUNTER — Inpatient Hospital Stay (HOSPITAL_COMMUNITY): Payer: PPO | Admitting: Occupational Therapy

## 2019-10-30 ENCOUNTER — Inpatient Hospital Stay (HOSPITAL_COMMUNITY): Payer: PPO | Admitting: Physical Therapy

## 2019-10-30 ENCOUNTER — Other Ambulatory Visit: Payer: Self-pay | Admitting: Medical

## 2019-10-30 DIAGNOSIS — I639 Cerebral infarction, unspecified: Secondary | ICD-10-CM

## 2019-10-30 DIAGNOSIS — I6350 Cerebral infarction due to unspecified occlusion or stenosis of unspecified cerebral artery: Secondary | ICD-10-CM | POA: Diagnosis not present

## 2019-10-30 LAB — GLUCOSE, CAPILLARY
Glucose-Capillary: 104 mg/dL — ABNORMAL HIGH (ref 70–99)
Glucose-Capillary: 135 mg/dL — ABNORMAL HIGH (ref 70–99)
Glucose-Capillary: 140 mg/dL — ABNORMAL HIGH (ref 70–99)
Glucose-Capillary: 120 mg/dL — ABNORMAL HIGH (ref 70–99)

## 2019-10-30 NOTE — Progress Notes (Signed)
[place] PHYSICAL MEDICINE & REHABILITATION PROGRESS NOTE   Subjective/Complaints:  RIght toe pain improved  ROS- neg CP SOB, N/V/D  Objective:   No results found. Recent Labs    10/29/19 1051  WBC 8.7  HGB 15.7  HCT 46.6  PLT 152   Recent Labs    10/29/19 1051  NA 136  K 4.3  CL 107  CO2 20*  GLUCOSE 122*  BUN 23  CREATININE 1.22  CALCIUM 9.4    Intake/Output Summary (Last 24 hours) at 10/30/2019 1221 Last data filed at 10/30/2019 1050 Gross per 24 hour  Intake 360 ml  Output 750 ml  Net -390 ml     Physical Exam: Vital Signs Blood pressure (!) 125/52, pulse 65, temperature 97.7 F (36.5 C), temperature source Oral, resp. rate 16, height 5\' 11"  (1.803 m), weight 129.1 kg, SpO2 95 %.  General: No acute distress Mood and affect are appropriate Heart: Regular rate and rhythm no rubs murmurs or extra sounds Lungs: Clear to auscultation, breathing unlabored, no rales or wheezes Abdomen: Positive bowel sounds, soft nontender to palpation, nondistended Extremities: No clubbing, cyanosis, or edema Skin: No evidence of breakdown, no evidence of rash Neurologic: Cranial nerves II through XII intact, motor strength is 5/5 in bilateral deltoid, bicep, tricep, grip, hip flexor, knee extensors, ankle dorsiflexor and plantar flexor Sensory exam normal sensation to light touch and proprioception in bilateral upper and lower extremities Cerebellar exam normal finger to nose to finger as well as heel to shin in bilateral upper and lower extremities Musculoskeletal: Full range of motion in all 4 extremities. No joint swelling    Assessment/Plan: 1. Functional deficits secondary to bilateral CVA which require 3+ hours per day of interdisciplinary therapy in a comprehensive inpatient rehab setting.  Physiatrist is providing close team supervision and 24 hour management of active medical problems listed below.  Physiatrist and rehab team continue to assess barriers to  discharge/monitor patient progress toward functional and medical goals  Care Tool:  Bathing  Bathing activity did not occur: Safety/medical concerns Body parts bathed by patient: Right arm, Chest, Abdomen, Right upper leg, Left upper leg, Face   Body parts bathed by helper: Left arm, Front perineal area, Buttocks, Right lower leg, Left lower leg     Bathing assist Assist Level: Maximal Assistance - Patient 24 - 49%     Upper Body Dressing/Undressing Upper body dressing Upper body dressing/undressing activity did not occur (including orthotics): Refused What is the patient wearing?: Pull over shirt    Upper body assist Assist Level: Moderate Assistance - Patient 50 - 74%    Lower Body Dressing/Undressing Lower body dressing      What is the patient wearing?: Underwear/pull up     Lower body assist Assist for lower body dressing: Total Assistance - Patient < 25%     Toileting Toileting    Toileting assist Assist for toileting: Total Assistance - Patient < 25% Assistive Device Comment: Stedy   Transfers Chair/bed transfer  Transfers assist  Chair/bed transfer activity did not occur: Safety/medical concerns  Chair/bed transfer assist level: Maximal Assistance - Patient 25 - 49%     Locomotion Ambulation   Ambulation assist   Ambulation activity did not occur: Safety/medical concerns          Walk 10 feet activity   Assist  Walk 10 feet activity did not occur: Safety/medical concerns        Walk 50 feet activity   Assist Walk 50 feet  with 2 turns activity did not occur: Safety/medical concerns         Walk 150 feet activity   Assist Walk 150 feet activity did not occur: Safety/medical concerns         Walk 10 feet on uneven surface  activity   Assist Walk 10 feet on uneven surfaces activity did not occur: Safety/medical concerns         Wheelchair     Assist Will patient use wheelchair at discharge?: Yes Type of  Wheelchair: Manual    Wheelchair assist level: Minimal Assistance - Patient > 75% Max wheelchair distance: 56'    Wheelchair 50 feet with 2 turns activity    Assist        Assist Level: Moderate Assistance - Patient 50 - 74%   Wheelchair 150 feet activity     Assist      Assist Level: Total Assistance - Patient < 25%   Blood pressure (!) 125/52, pulse 65, temperature 97.7 F (36.5 C), temperature source Oral, resp. rate 16, height 5\' 11"  (1.803 m), weight 129.1 kg, SpO2 95 %.  Medical Problem List and Plan: 1.  Right-sided weakness with dysarthria secondary to acute to subacute infarct multiple vascular territories involving bilateral brain parenchyma(left pons, inferior right cerebellum, left frontal lobe and right parietal lobe).  Small chronic right cerebellar infarct             -patient may  shower             -ELOS/Goals: 2- 2.5 weeks- goals Mod I to supervision 2.  Antithrombotics: -DVT/anticoagulation: Lovenox             -antiplatelet therapy: Aspirin 81 mg daily, Plavix 75 mg daily 3. Pain Management: Neurontin 600 mg 3 times daily, Flexeril as needed 4. Mood: Provide emotional support             -antipsychotic agents: N/A 5. Neuropsych: This patient is capable of making decisions on his own behalf. 6. Skin/Wound Care: Routine skin checks 7. Fluids/Electrolytes/Nutrition: Routine in and outs with follow-up chemistries 8.  Diabetes mellitus with peripheral neuropathy.  Hemoglobin A1c 6.6.  SSI. CBG (last 3)  Recent Labs    10/29/19 1723 10/29/19 2105 10/30/19 0638  GLUCAP 170* 104* 104*  Controlled 5/6 9.  Permissive hypertension.  Monitor with increased mobility.  Patient on Cozaar 50 mg daily, Maxide 37.5-25 mg daily prior to admission.  Resume as needed Vitals:   10/29/19 1954 10/30/19 0503  BP: (!) 149/76 (!) 125/52  Pulse: 79 65  Resp: 20 16  Temp: 98.6 F (37 C) 97.7 F (36.5 C)  SpO2: 97% 95%  Controlled off antihypertensive  medications 10.  Hyperlipidemia.  Lipitor 11. Gout flare- pt has had before- will start Colchicine 0.6 mg BID and also Prednisone 30 mg x3days -will taper to 20 mg x 3 days    LOS: 2 days A FACE TO Jordan Chang 10/30/2019, 12:21 PM

## 2019-10-30 NOTE — Progress Notes (Signed)
   Cardiology asked to order a 30-day cardiac event monitor to further evaluate the etiology of his stroke.  Beatriz Stallion, PA-C 10/30/19; 1:29 PM

## 2019-10-30 NOTE — IPOC Note (Signed)
Overall Plan of Care [facility]) Patient Details Name: Jordan Chang MRN: [id number] DOB: March 16, 1941  Admitting Diagnosis: Left pontine cerebrovascular accident [facility])  Hospital Problems: Principal Problem:   Left pontine cerebrovascular accident [facility]) Active Problems:   Type 2 diabetes mellitus with other specified complication (HCC)   Essential hypertension     Functional Problem List: Nursing Endurance, Edema, Bowel, Pain, Safety, Motor, Medication Management  PT Balance, Endurance, Motor, Pain, Safety  OT Balance, Behavior, Cognition, Endurance, Motor, Pain, Safety  SLP Cognition  TR         Basic ADL's: OT Grooming, Bathing, Dressing, Toileting     Advanced  ADL's: OT       Transfers: PT Bed Mobility, Bed to Chair, Car, Furniture, Floor  OT Toilet, Research scientist (life sciences): PT Ambulation, Psychologist, prison and probation services, Stairs     Additional Impairments: OT Fuctional Use of Upper Extremity  SLP Social Cognition   Problem Solving, Memory, Awareness  TR      Anticipated Outcomes Item Anticipated Outcome  Self Feeding supervision  Swallowing      Basic self-care  S - min A  Toileting  min A   Bathroom Transfers min A  Bowel/Bladder  manage bowel and bladder with mod I assist  Transfers  min A  Locomotion  min A with LRAD  Communication     Cognition  Supervision A  Pain  pain level less than 4 on scale of 0-10  Safety/Judgment  remain free of injury, prevent falls with cues and reminders   Therapy Plan: PT Intensity: Minimum of 1-2 x/day ,45 to 90 minutes PT Frequency: 5 out of 7 days PT Duration Estimated Length of Stay: 18-21 days OT Intensity: Minimum of 1-2 x/day, 45 to 90 minutes OT Frequency: 5 out of 7 days OT Duration/Estimated Length of Stay: 2.5- 3 weeks SLP Intensity: Minumum of 1-2 x/day, 30 to 90 minutes SLP Frequency: 3 to 5 out of 7 days SLP Duration/Estimated Length of Stay: 1.5-2 weeks   Due to the current state of emergency,  patients may not be receiving their 3-hours of Medicare-mandated therapy.   Team Interventions: Nursing Interventions Patient/Family Education, Pain Management, Bowel Management, Cognitive Remediation/Compensation, Psychosocial Support, Medication Management, Discharge Planning  PT interventions Ambulation/gait training, Balance/vestibular training, Community reintegration, Discharge planning, Disease management/prevention, DME/adaptive equipment instruction, Functional electrical stimulation, Functional mobility training, Neuromuscular re-education, Pain management, Patient/family education, Psychosocial support, Splinting/orthotics, Stair training, Therapeutic Activities, Therapeutic Exercise, UE/LE Strength taining/ROM, UE/LE Coordination activities, Wheelchair propulsion/positioning  OT Interventions Warden/ranger, Fish farm manager, Patient/family education, Therapeutic Activities, Wheelchair propulsion/positioning, Cognitive remediation/compensation, Psychosocial support, Therapeutic Exercise, Community reintegration, Functional mobility training, Self Care/advanced ADL retraining, UE/LE Strength taining/ROM, Discharge planning, Neuromuscular re-education, UE/LE Coordination activities, Pain management  SLP Interventions Cognitive remediation/compensation, Cueing hierarchy, Functional tasks, Patient/family education, Internal/external aids  TR Interventions    SW/CM Interventions Discharge Planning, Psychosocial Support, Patient/Family Education   Barriers to Discharge MD  Medical stability and Weight  Nursing      PT Home environment access/layout    OT Decreased caregiver support    SLP      SW Medical stability     Team Discharge Planning: Destination: PT-Home ,OT- Home , SLP-Home Projected Follow-up: PT-Home health PT, 24 hour supervision/assistance, OT-  24 hour supervision/assistance, Home health OT, SLP-24 hour supervision/assistance, Other  (comment)(follow up ST TBD depending on progress) Projected Equipment Needs: PT-Rolling walker with 5" wheels, Wheelchair (measurements), Wheelchair cushion (measurements), OT- To be determined, SLP-None recommended by SLP Equipment  Details: PT-wide RW; 20x18 w/c, OT-  Patient/family involved in discharge planning: PT- Patient, Family member/caregiver,  OT-Patient, Family member/caregiver, SLP-Patient  MD ELOS: 14-18d Medical Rehab Prognosis:  Good Assessment:  79 year old right-handed male with history of hypertension, morbid obesity with BMI 39.51, diabetes mellitus with neuropathy, chronic back pain with lumbar laminectomy decompression 01/20/2016, hyperlipidemia.  Per chart review patient lives with spouse.  Independent with a straight point cane.  1 level home 3 steps to entry.  Family assistance as needed.  Presented to [facility] 10/26/2019 with slurred speech and right-sided weakness.  Admission chemistries with alcohol negative, glucose 136, BUN 27, creatinine 1.20, urine drug screen negative.  Cranial CT scan negative for acute changes.  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  CT angiogram of head and neck no emergent large vessel occlusion or high-grade stenosis.  MRI of the brain showed few small acute to subacute infarcts multiple vascular territories.  Chronic microvascular ischemic changes.  Patient did not receive TPA.  Echocardiogram with ejection fraction of 65%.  Grade 1 diastolic dysfunction.  Lower extremity Doppler negative for DVT.  Currently on aspirin and Plavix for CVA prophylaxis.  Subcutaneous Lovenox for DVT prophylaxis.  Tolerating a regular diet.  Therapy evaluations completed and patient was admitted for a comprehensive rehab program.    Now requiring 24/7 Rehab RN,MD, as well as CIR level PT, OT and SLP.  Treatment team will focus on ADLs and mobility with goals set at [facility]  See Team Conference Notes for weekly updates to the plan of care

## 2019-10-30 NOTE — Progress Notes (Signed)
Physical Therapy Session Note  Patient Details  Name: Jordan Chang MRN: [id number] Date of Birth: 1941/05/24  Today's Date: 10/30/2019 PT Individual Time: 0850-1000 PT Individual Time Calculation (min): 70 min   Short Term Goals: Week 1:  PT Short Term Goal 1 (Week 1): Pt will complete least restrictive transfers with max A consistently PT Short Term Goal 2 (Week 1): Pt will initiate gait training as safe and able PT Short Term Goal 3 (Week 1): Pt will perform w/c mobility x 50 ft with min A  Skilled Therapeutic Interventions/Progress Updates:   Pt received sitting in recliner and agreeable to PT. Sit<>stand with max assist and RW and PT pulling pants to waist. Sit<>stand in stedy with min assist to transfer to [facility]. Pt performed additional stedy transfer to mat table in rehab gym with CGA.   Sit<>stand from elevated surface and mod assist progressing to min assist. Performed blocked practice sit<>stand with RW from elevated height x 7. pregait stepping R and L with mod on the R to improved coordination and step width. Mod-max assist  Advancing the L to prevent buckle on the R. Static standing balance with BUE support on RW 3 x 45 sec with mod progressing to min assist to prevent posterior LOB. Max cues for awareness of LOB and use of ankle strategy to correct.   Stand pivot transfer to and from [facility] x 2 with max assist for RLE stability and RW management and max cues for posture and sequecning throughout.   nustep reciprocal endurance, NMR: 10 min, level 6 with BUE/BLE. With CUes for full ROM  on the RLE and symmetry throughout.   Patient returned to room and performed stand pivot to recliner with mod assist and stedy. Noted bladder incontinence while in stedy. Pt assisted with doffing soild clothes in stedy. Pt left sitting in recliner with urinal, call bell in reach and all needs met.         Therapy Documentation Precautions:  Precautions Precautions: Fall Restrictions Weight  Bearing Restrictions: No   Pain: Pain Assessment Pain Scale: 0-10 Pain Score: 0-No pain    Therapy/Group: Individual Therapy  Lorie Phenix 10/30/2019, 10:08 AM

## 2019-10-30 NOTE — Progress Notes (Signed)
Speech Language Pathology Daily Session Note  Patient Details  Name: Jordan Chang MRN: [id number] Date of Birth: Mar 10, 1941  Today's Date: 10/30/2019 SLP Individual Time: 1100-1200 SLP Individual Time Calculation (min): 60 min  Short Term Goals: Week 1: SLP Short Term Goal 1 (Week 1): Pt will recall functional daily and new information with Min A for use of aids or strategies. SLP Short Term Goal 2 (Week 1): Pt will demonstrate ability to problem solve mildly complex functional tasks with Min A verbal/visual cues. SLP Short Term Goal 3 (Week 1): Pt will anticipate 2 activities he will need assistance with at home (based on current deficits) with Mod A verbal/visual cues.  Skilled Therapeutic Interventions: Skilled ST services focused on cognitive skills. SLP facilitated basic problem solving and error awareness skills utilizing money management task (ALFA) pt required initial mod A verbal cues for error awareness fading to mod I in displaying and subtracting change. Pt expressed that his wife handles all fiances, while he independently manages his medication. Pt was able to verbally explain his medication refill process through the [place], however was unable to consistently name medication consumed at home. SLP educated pt on current medication prescribed during admission, creating medication list, pt required min A fading to supervision A verbal cues in verbal problem solving of complex medication x2 and x3 per day. Pt does not use a pill organizer and would like to continue his system of checking labels and removing each pill from bottle. Pt demonstrated increase but inconsistent insight into acute deficits throughout treatment session. Education was provided on driving restrictions and pt was agreeable. SLP recommends continued education. Pt was left in room with call bell within reach and chair alarm set. ST recommends to continue skilled ST services     Pain Pain Assessment Pain Score: 0-No  pain  Therapy/Group: Individual Therapy  Adden Strout  [facility] 10/30/2019, 12:20 PM

## 2019-10-30 NOTE — Progress Notes (Signed)
Occupational Therapy Session Note  Patient Details  Name: Jordan Chang MRN: [id number] Date of Birth: 07-07-1940  Today's Date: 10/30/2019 OT Individual Time: 1415-1530 OT Individual Time Calculation (min): 75 min    Short Term Goals: Week 1:  OT Short Term Goal 1 (Week 1): Pt will perform toilet transfer with mod A overall. OT Short Term Goal 2 (Week 1): Pt will perform toileting with mod A overall. OT Short Term Goal 3 (Week 1): Pt will perform LB dressing with mod A overall.  Skilled Therapeutic Interventions/Progress Updates:    Upon entering the room, pt seated in recliner chair awaiting OT arrival with no c/o pain this session. Pt standing from recliner chair with mod A into stedy and assisted into bathroom for transfer onto TTB. Pt standing from stedy with min A. Pt bathing while seated on bench with supervision for balance. Pt needing assistance with washing buttocks and B LEs. Hand over hand assist utilized with R UE to thoroughly wash L side. Pt attempting to use R UE much more functionally this session with less cuing needed. Pt drying and therapist assisting pt with threading pants over B feet. Pt then standing in stedy and pulling pants over L hip himself with assist from therapist to complete further. Pt received phone call from pastor during this session which made him emotional but able to redirect for tasks. Pt returning to sit in recliner chair with stedy and providing cuing for hemiplegic dressing technique with min A overall. OT provided pt with theraputty exercises with red resistive putty for use on R UE with min cuing needed for technique. Pt remained in recliner chair and B LEs elevated secondary to increased edema noted this session. Call bell and all needed items within reach.   Therapy Documentation Precautions:  Precautions Precautions: Fall Restrictions Weight Bearing Restrictions: No General:   Vital Signs: Therapy Vitals Temp: 98 F (36.7 C) Pulse Rate:  91 Resp: 17 BP: (!) 158/68 Patient Position (if appropriate): Sitting Oxygen Therapy SpO2: 94 % O2 Device: Room Air Pain: Pain Assessment Pain Score: 0-No pain   Therapy/Group: Individual Therapy  Alen Bleacher 10/30/2019, 4:13 PM

## 2019-10-31 ENCOUNTER — Inpatient Hospital Stay (HOSPITAL_COMMUNITY): Payer: PPO | Admitting: Physical Therapy

## 2019-10-31 ENCOUNTER — Inpatient Hospital Stay (HOSPITAL_COMMUNITY): Payer: PPO

## 2019-10-31 ENCOUNTER — Inpatient Hospital Stay (HOSPITAL_COMMUNITY): Payer: PPO | Admitting: Occupational Therapy

## 2019-10-31 LAB — GLUCOSE, CAPILLARY
Glucose-Capillary: 103 mg/dL — ABNORMAL HIGH (ref 70–99)
Glucose-Capillary: 120 mg/dL — ABNORMAL HIGH (ref 70–99)
Glucose-Capillary: 143 mg/dL — ABNORMAL HIGH (ref 70–99)
Glucose-Capillary: 115 mg/dL — ABNORMAL HIGH (ref 70–99)

## 2019-10-31 NOTE — Progress Notes (Signed)
[place] PHYSICAL MEDICINE & REHABILITATION PROGRESS NOTE   Subjective/Complaints:  RIght toe pain improved, no foot pain, he is like he is doing well in his therapies  ROS- neg CP SOB, N/V/D  Objective:   No results found. Recent Labs    10/29/19 1051  WBC 8.7  HGB 15.7  HCT 46.6  PLT 152   Recent Labs    10/29/19 1051  NA 136  K 4.3  CL 107  CO2 20*  GLUCOSE 122*  BUN 23  CREATININE 1.22  CALCIUM 9.4    Intake/Output Summary (Last 24 hours) at 10/31/2019 1347 Last data filed at 10/31/2019 0730 Gross per 24 hour  Intake 380 ml  Output 550 ml  Net -170 ml     Physical Exam: Vital Signs Blood pressure 134/63, pulse (!) 59, temperature (!) 97.5 F (36.4 C), temperature source Oral, resp. rate 16, height 5\' 11"  (1.803 m), weight 129.1 kg, SpO2 98 %.   General: No acute distress Mood and affect are appropriate Heart: Regular rate and rhythm no rubs murmurs or extra sounds Lungs: Clear to auscultation, breathing unlabored, no rales or wheezes Abdomen: Positive bowel sounds, soft nontender to palpation, nondistended Extremities: No clubbing, cyanosis, or edema Skin: No evidence of breakdown, no evidence of rash Neurologic: Cranial nerves II through XII intact, motor strength is 5/5 in left and 4/5 Right  deltoid, bicep, tricep, grip, hip flexor, knee extensors, ankle dorsiflexor and plantar flexor sensation intact LT BUE and BLE Musculoskeletal: Full range of motion in all 4 extremities. No joint swelling     Assessment/Plan: 1. Functional deficits secondary to bilateral CVA which require 3+ hours per day of interdisciplinary therapy in a comprehensive inpatient rehab setting.  Physiatrist is providing close team supervision and 24 hour management of active medical problems listed below.  Physiatrist and rehab team continue to assess barriers to discharge/monitor patient progress toward functional and medical goals  Care Tool:  Bathing  Bathing activity  did not occur: Safety/medical concerns Body parts bathed by patient: Right arm, Chest, Abdomen, Right upper leg, Left upper leg, Face, Front perineal area   Body parts bathed by helper: Left arm, Right lower leg, Left lower leg, Buttocks     Bathing assist Assist Level: Moderate Assistance - Patient 50 - 74%     Upper Body Dressing/Undressing Upper body dressing Upper body dressing/undressing activity did not occur (including orthotics): Refused What is the patient wearing?: Pull over shirt    Upper body assist Assist Level: Minimal Assistance - Patient > 75%    Lower Body Dressing/Undressing Lower body dressing      What is the patient wearing?: Underwear/pull up     Lower body assist Assist for lower body dressing: Maximal Assistance - Patient 25 - 49%     Toileting Toileting    Toileting assist Assist for toileting: Minimal Assistance - Patient > 75% Assistive Device Comment: Urinal   Transfers Chair/bed transfer  Transfers assist  Chair/bed transfer activity did not occur: Safety/medical concerns  Chair/bed transfer assist level: Maximal Assistance - Patient 25 - 49%     Locomotion Ambulation   Ambulation assist   Ambulation activity did not occur: Safety/medical concerns          Walk 10 feet activity   Assist  Walk 10 feet activity did not occur: Safety/medical concerns        Walk 50 feet activity   Assist Walk 50 feet with 2 turns activity did not occur: Safety/medical concerns  Walk 150 feet activity   Assist Walk 150 feet activity did not occur: Safety/medical concerns         Walk 10 feet on uneven surface  activity   Assist Walk 10 feet on uneven surfaces activity did not occur: Safety/medical concerns         Wheelchair     Assist Will patient use wheelchair at discharge?: Yes Type of Wheelchair: Manual    Wheelchair assist level: Minimal Assistance - Patient > 75% Max wheelchair distance: 28'     Wheelchair 50 feet with 2 turns activity    Assist        Assist Level: Moderate Assistance - Patient 50 - 74%   Wheelchair 150 feet activity     Assist      Assist Level: Total Assistance - Patient < 25%   Blood pressure 134/63, pulse (!) 59, temperature (!) 97.5 F (36.4 C), temperature source Oral, resp. rate 16, height 5\' 11"  (1.803 m), weight 129.1 kg, SpO2 98 %.  Medical Problem List and Plan: 1.  Right-sided weakness with dysarthria secondary to acute to subacute infarct multiple vascular territories involving bilateral brain parenchyma(left pons, inferior right cerebellum, left frontal lobe and right parietal lobe).  Small chronic right cerebellar infarct             -patient may  shower             -ELOS/Goals: 2- 2.5 weeks- goals Mod I to supervision 2.  Antithrombotics: -DVT/anticoagulation: Lovenox             -antiplatelet therapy: Aspirin 81 mg daily, Plavix 75 mg daily 3. Pain Management: Neurontin 600 mg 3 times daily, Flexeril as needed 4. Mood: Provide emotional support             -antipsychotic agents: N/A 5. Neuropsych: This patient is capable of making decisions on his own behalf. 6. Skin/Wound Care: Routine skin checks 7. Fluids/Electrolytes/Nutrition: Routine in and outs with follow-up chemistries 8.  Diabetes mellitus with peripheral neuropathy.  Hemoglobin A1c 6.6.  SSI. CBG (last 3)  Recent Labs    10/30/19 2115 10/31/19 0605 10/31/19 1131  GLUCAP 120* 103* 120*  Controlled 5/7 9.  Permissive hypertension.  Monitor with increased mobility.  Patient on Cozaar 50 mg daily, Maxide 37.5-25 mg daily prior to admission.  Resume as needed Vitals:   10/30/19 2113 10/31/19 0603  BP: 128/64 134/63  Pulse: 67 (!) 59  Resp: 18 16  Temp: 97.8 F (36.6 C) (!) 97.5 F (36.4 C)  SpO2: 98% 98%  Controlled off antihypertensive medications 10.  Hyperlipidemia.  Lipitor 11. Gout flare- pt has had before- will start Colchicine 0.6 mg BID and also  Prednisone 30 mg x3days -will taper to 20 mg x 3 days    LOS: 3 days A FACE TO [place] E Jayra Choyce 10/31/2019, 1:47 PM

## 2019-10-31 NOTE — Progress Notes (Signed)
Physical Therapy Session Note  Patient Details  Name: Jordan Chang MRN: [id number] Date of Birth: April 24, 1941  Today's Date: 10/31/2019 PT Individual Time: 0904-1000 PT Individual Time Calculation (min): 56 min   Short Term Goals: Week 1:  PT Short Term Goal 1 (Week 1): Pt will complete least restrictive transfers with max A consistently PT Short Term Goal 2 (Week 1): Pt will initiate gait training as safe and able PT Short Term Goal 3 (Week 1): Pt will perform w/c mobility x 50 ft with min A Week 2:     Skilled Therapeutic Interventions/Progress Updates:   Pt received sitting in WC and agreeable to PT. Pt reports need to urinate. Stedy transfer to toilet with supervision assistfor sit<>stand in stedy. Pt unable to void sitting on toilet. Stedy transfer back to [facility] with supervision assist to stand. PT required to manage pants and underwear prior to and following toiletting attempt.   Pt transported to rehab gym. Sit<>stand x 5 in parallel bars with min assist overall with pt pulling on rails. Pre-gait reciprocal stepping with BUE support on Rail and CGA to prevent buckling on the R.   Gait training with min assist in parallel bars forward/reverse 2 x 55f each with min assist ovreall and moderate cues for sequencing and awareness of the RUE. Additional gait training with RW and min assist overall 2 x 8103fwith tactile cues for improved knee extension in stance on the R.   Pt transported to day room. Stand pivot transfer to and from nustep with min assist overall and moderate cues for gait pattern, posture, and step width on the R. nustep reciprocal movement and cardiovascular training x 11 min with min assist to improved ROM on the RUE and encourage symmetry throughout. Patient returned to room and performed steady to recliner. Pt left sitting in recliner with call bell in reach and all needs met.         Therapy Documentation Precautions:  Precautions Precautions:  Fall Restrictions Weight Bearing Restrictions: No   Pain: Pain Assessment Pain Scale: 0-10 Pain Score: 0-No pain    Therapy/Group: Individual Therapy  AuLorie Phenix/12/2019, 10:06 AM

## 2019-10-31 NOTE — Progress Notes (Signed)
Occupational Therapy Session Note  Patient Details  Name: Jordan Chang MRN: [id number] Date of Birth: 05-02-1941  Today's Date: 10/31/2019 OT Individual Time: 1300-1413 OT Individual Time Calculation (min): 73 min   Short Term Goals: Week 1:  OT Short Term Goal 1 (Week 1): Pt will perform toilet transfer with mod A overall. OT Short Term Goal 2 (Week 1): Pt will perform toileting with mod A overall. OT Short Term Goal 3 (Week 1): Pt will perform LB dressing with mod A overall.  Skilled Therapeutic Interventions/Progress Updates:    Pt greeted in his recliner with no c/o pain. His wife was assisting him with using the urinal. OT brought pt an elongated BSC for the bathroom, however it was unable to properly fit over toilet due to position of the toilet paper dispenser. Pt was motivated to attempt toilet transfer with standard seat vs elevated seat. Stedy utilized for transfers today for time management as pt needed to use the restroom and also wanted to shower. Supervision for sit<stand in device with pt able to grip the bar with his Rt hand unassisted. Pt able to assist with lowering boxers using the Lt hand, note that he did not rely on the knee support of the Stedy at this time. Pt had continent B+B void, OT assisted him with perihygiene while standing, supervision for sit<stand from low surface. Transitioned to TTB where pt then bathed while seated utilizing lateral leans as needed. Pt able to use the Rt hand to wash his Lt side and also to lather hair with shampoo a little. OT assisted him with reaching feet and he would benefit from a LH sponge. Pt required Max A for donning boxers sit<stand from TTB using Stedy, able to assist with elevating fabric on the Lt side. Total A for Teds and gripper socks. When pt transferred to the recliner he donned his overhead shirt with Min A to pull over trunk on the Rt side. Setup to comb hair. He declined wearing pants because this made using the urinal more  difficult. Pt remained in the recliner with all needs within reach and LEs elevated for edema mgt. Spouse present and chair alarm activated.    Therapy Documentation Precautions:  Precautions Precautions: Fall Restrictions Weight Bearing Restrictions: No Vital Signs: Therapy Vitals Temp: 97.8 F (36.6 C) Temp Source: Oral Pulse Rate: 66 Resp: 18 BP: (!) 152/71 Patient Position (if appropriate): Sitting Oxygen Therapy SpO2: 95 % O2 Device: Room Air ADL:       Therapy/Group: Individual Therapy  Shekelia Boutin A Lakyn Mantione 10/31/2019, 4:18 PM

## 2019-10-31 NOTE — Plan of Care (Signed)
  Problem: Consults Goal: RH STROKE PATIENT EDUCATION Description: See Patient Education module for education specifics  Outcome: Progressing   Problem: RH BOWEL ELIMINATION Goal: RH STG MANAGE BOWEL WITH ASSISTANCE Description: STG Manage Bowel with mod I Assistance. Outcome: Progressing   Problem: RH SKIN INTEGRITY Goal: RH STG MAINTAIN SKIN INTEGRITY WITH ASSISTANCE Description: STG Maintain Skin Integrity With mod I Assistance. Outcome: Progressing   Problem: RH SAFETY Goal: RH STG ADHERE TO SAFETY PRECAUTIONS W/ASSISTANCE/DEVICE Description: STG Adhere to Safety Precautions With cues and reminders Outcome: Progressing   Problem: RH PAIN MANAGEMENT Goal: RH STG PAIN MANAGED AT OR BELOW PT'S PAIN GOAL Description: Pain level less than 4 on scale of 0-10 Outcome: Progressing   Problem: RH KNOWLEDGE DEFICIT Goal: RH STG INCREASE KNOWLEDGE OF DIABETES Description: Pt will be able to adhere to medication regimen, dietary and lifestyle modifications to better control blood glucose levels with mod I assist upon discharge.   Outcome: Progressing Goal: RH STG INCREASE KNOWLEDGE OF HYPERTENSION Description: Pt will be able to adhere to medication regimen, dietary and lifestyle modifications to better control blood pressure with mod I assist upon discharge.   Outcome: Progressing Goal: RH STG INCREASE KNOWLEGDE OF HYPERLIPIDEMIA Description: Pt will be able to adhere to medication regimen, dietary and lifestyle modifications to better control cholesterol levels with mod I assist upon discharge.   Outcome: Progressing Goal: RH STG INCREASE KNOWLEDGE OF STROKE PROPHYLAXIS Description: Pt will be able to adhere to medication regimen, dietary and lifestyle modifications to better control blood pressure and prevent stroke with mod I assist upon discharge.   Outcome: Progressing

## 2019-10-31 NOTE — Plan of Care (Signed)
  Problem: Consults Goal: RH STROKE PATIENT EDUCATION Description: See Patient Education module for education specifics  Outcome: Progressing   Problem: RH BOWEL ELIMINATION Goal: RH STG MANAGE BOWEL WITH ASSISTANCE Description: STG Manage Bowel with mod I Assistance. Outcome: Progressing   Problem: RH SKIN INTEGRITY Goal: RH STG MAINTAIN SKIN INTEGRITY WITH ASSISTANCE Description: STG Maintain Skin Integrity With mod I Assistance. Outcome: Progressing   Problem: RH SAFETY Goal: RH STG ADHERE TO SAFETY PRECAUTIONS W/ASSISTANCE/DEVICE Description: STG Adhere to Safety Precautions With cues and reminders Outcome: Progressing   Problem: RH PAIN MANAGEMENT Goal: RH STG PAIN MANAGED AT OR BELOW PT'S PAIN GOAL Description: Pain level less than 4 on scale of 0-10 Outcome: Progressing   Problem: RH KNOWLEDGE DEFICIT Goal: RH STG INCREASE KNOWLEDGE OF DIABETES Description: Pt will be able to adhere to medication regimen, dietary and lifestyle modifications to better control blood glucose levels with mod I assist upon discharge.   Outcome: Progressing Goal: RH STG INCREASE KNOWLEDGE OF HYPERTENSION Description: Pt will be able to adhere to medication regimen, dietary and lifestyle modifications to better control blood pressure with mod I assist upon discharge.   Outcome: Progressing Goal: RH STG INCREASE KNOWLEGDE OF HYPERLIPIDEMIA Description: Pt will be able to adhere to medication regimen, dietary and lifestyle modifications to better control cholesterol levels with mod I assist upon discharge.   Outcome: Progressing Goal: RH STG INCREASE KNOWLEDGE OF STROKE PROPHYLAXIS Description: Pt will be able to adhere to medication regimen, dietary and lifestyle modifications to better control blood pressure and prevent stroke with mod I assist upon discharge.   Outcome: Progressing   

## 2019-10-31 NOTE — Progress Notes (Signed)
Speech Language Pathology Daily Session Note  Patient Details  Name: LEVIN DAGOSTINO MRN: [id number] Date of Birth: [birth year]  Today's Date: 10/31/2019 SLP Individual Time: 0805-0900 SLP Individual Time Calculation (min): 55 min  Short Term Goals: Week 1: SLP Short Term Goal 1 (Week 1): Pt will recall functional daily and new information with Min A for use of aids or strategies. SLP Short Term Goal 2 (Week 1): Pt will demonstrate ability to problem solve mildly complex functional tasks with Min A verbal/visual cues. SLP Short Term Goal 3 (Week 1): Pt will anticipate 2 activities he will need assistance with at home (based on current deficits) with Mod A verbal/visual cues.  Skilled Therapeutic Interventions: Skilled ST services focused on cognitive skills. Pt demonstrated recall of majority of yesterday's ST treatment session and vitals from this morning. Pt requested transfer into chair from bed, with stedy pt required only supervision a verbal cues for safety awareness. SLP facilitated mildly complex problem solving skills with QID medication management task, pt required min A  Fading to supervision A verbal cues for verbal problem solving, while required max A verbal cues for functional problem solving/awareness of errors. SLP believes once the task becomes more familiar, pill organizer is a new system, pt will demonstrate an increase in skill level.  Pt was left in room with call bell within reach and chair alarm set. ST recommends to continue skilled ST services.      Pain Pain Assessment Pain Score: 0-No pain  Therapy/Group: Individual Therapy  Bohdan Macho  [facility] 10/31/2019, 4:36 PM

## 2019-11-01 ENCOUNTER — Inpatient Hospital Stay (HOSPITAL_COMMUNITY): Payer: PPO | Admitting: Physical Therapy

## 2019-11-01 ENCOUNTER — Inpatient Hospital Stay (HOSPITAL_COMMUNITY): Payer: PPO | Admitting: Speech Pathology

## 2019-11-01 ENCOUNTER — Inpatient Hospital Stay (HOSPITAL_COMMUNITY): Payer: PPO | Admitting: Occupational Therapy

## 2019-11-01 DIAGNOSIS — I10 Essential (primary) hypertension: Secondary | ICD-10-CM

## 2019-11-01 LAB — GLUCOSE, CAPILLARY
Glucose-Capillary: 104 mg/dL — ABNORMAL HIGH (ref 70–99)
Glucose-Capillary: 84 mg/dL (ref 70–99)
Glucose-Capillary: 98 mg/dL (ref 70–99)
Glucose-Capillary: 142 mg/dL — ABNORMAL HIGH (ref 70–99)

## 2019-11-01 NOTE — Plan of Care (Signed)
  Problem: RH BOWEL ELIMINATION Goal: RH STG MANAGE BOWEL WITH ASSISTANCE Description: STG Manage Bowel with mod I Assistance. Outcome: Progressing   Problem: RH SAFETY Goal: RH STG ADHERE TO SAFETY PRECAUTIONS W/ASSISTANCE/DEVICE Description: STG Adhere to Safety Precautions With cues and reminders Outcome: Progressing

## 2019-11-01 NOTE — Progress Notes (Signed)
Occupational Therapy Session Note  Patient Details  Name: Jordan Chang MRN: [id number] Date of Birth: [birth year]  Today's Date: 11/01/2019 OT Individual Time: 0258-5277 OT Individual Time Calculation (min): 87 min   Short Term Goals: Week 1:  OT Short Term Goal 1 (Week 1): Pt will perform toilet transfer with mod A overall. OT Short Term Goal 2 (Week 1): Pt will perform toileting with mod A overall. OT Short Term Goal 3 (Week 1): Pt will perform LB dressing with mod A overall.  Skilled Therapeutic Interventions/Progress Updates:    Pt greeted in his recliner with no c/o pain. Requesting to shower. Min A for stand pivot<w/c using RW and then Min A for stand pivot<TTB using grab bars. Pt then bathed while seated with assistance for washing both feet. He was able to complete partial stand in order to wash his bottom with CGA. LB dressing was completed at sit<stand level from TTB using RW. Pt able to thread both LEs into underwear and pants with increased time using hemi techniques and Min A. Mod-Max A to elevate LB garments over hips and adjust them while standing, Min A sit<stand. He then completed short distance ambulatory transfer to the standard toilet with vcs and Min A. Pt then had B+B void. Worked on sit<stands and standing balance during hygiene and dressing tasks using RW once again. OT completed perihygiene while pt stood with CGA and B UE support on walker. When pt attempted to assist with hygiene his Rt knee began to buckle and we had to sit down for safety. Max A for clothing mgt x2 due to urgency and fatigue afterwards. Stand pivot<w/c completed with Min A where pt then completed handwashing and oral care while sitting at the sink. Vcs for increasing functional use of Rt, pt using active assist technique to include Rt hand while manipulating toothbrush. Min A to dispense toothpaste onto brush. Afterwards pt completed a short distance ambulatory transfer to the recliner using RW with Min A  and vcs. Brought pt a blue foam sponge and red theraputty. His dtr was present at this time and we discussed exercise techniques with dtr providing appropriate cues when pt became more talkative and began using putty with just the Lt hand. Pt remained in his recliner with all needs within reach, chair alarm set, and LEs elevated for edema mgt.   Voice amplifier used for communication due to [facility]   Therapy Documentation Precautions:  Precautions Precautions: Fall Restrictions Weight Bearing Restrictions: No Pain: Pain Assessment Pain Scale: Faces Faces Pain Scale: No hurt ADL:        Therapy/Group: Individual Therapy  Kanaya Gunnarson A Sanskriti Greenlaw 11/01/2019, 12:33 PM

## 2019-11-01 NOTE — Progress Notes (Signed)
Physical Therapy Session Note  Patient Details  Name: Jordan Chang MRN: [id number] Date of Birth: Jul 11, 1940  Today's Date: 11/01/2019 PT Individual Time: 1309-1408 PT Individual Time Calculation (min): 59 min   Short Term Goals: Week 1:  PT Short Term Goal 1 (Week 1): Pt will complete least restrictive transfers with max A consistently PT Short Term Goal 2 (Week 1): Pt will initiate gait training as safe and able PT Short Term Goal 3 (Week 1): Pt will perform w/c mobility x 50 ft with min A  Skilled Therapeutic Interventions/Progress Updates:    Pt received sitting in recliner and agreeable to therapy session. Amplifier used during session for improved hearing as pt reports he is deaf in L ear. Pt very motivated and eager to improve, stating "Let me try myself" and "I'm going to walk out of here." Sit>stand recliner>RW with min assist for lifting and L stand pivot to w/c with min assist. Transported in/out of bathroom in w/c. Stand pivot w/c<>toilet using grab bar with min assist for lifting and balance while turning. Continent of bladder - total assist for posterior peri-care to ensure cleanliness.  Transported to/from gym in w/c for time management and energy conservation. Gait training back/forth for ~54ft x3.5 rounds totaling ~175ft using RW with min/light mod assist for balance and AD management - demonstrates decreased R LE foot clearance, minimal/no ankle DF (though demonstrates 2-/5 MMT in sitting), and increased R knee extension during initial contact. Repeated sit<>stands without B UE support x12reps with mirror feedback and cuing for R lateral weight shift to increase R LE muscle activation. Seated R LE hamstring curls against level 1 theraband resistance x15reps with cuing for isometric hold at end for increased muscle activation. Standing with RW performed R LE hamstring curls x15reps with min assist for balance - mirror feedback and cuing for upright posture during task and improved  control of movement for increased muscle activation. Standing with B UE support on RW R LE NMR targeting swing phase of gait via foot taps on 4" step x15reps - demonstrates limited hip abductor activation with pt placing foot towards midline, cuing for improvement - pt able to clear foot to place in step. Gait ~66ft back to w/c using RW with min/mod assist for balance (R lateral lean) and AD management - cuing for improved R LE gait mechanics and upright posture. Transported back to room. Stand pivot to recliner using RW with min assist for balance and cuing for R LE stepping. Pt left seated in recliner with needs in reach, chair alarm on, and his wife [name]) present.   Therapy Documentation Precautions:  Precautions Precautions: Fall Restrictions Weight Bearing Restrictions: No  Pain: No reports of pain throughout session.   Therapy/Group: Individual Therapy  Ginny Forth, PT, DPT 11/01/2019, 1:05 PM

## 2019-11-01 NOTE — Progress Notes (Signed)
Speech Language Pathology Daily Session Note  Patient Details  Name: Jordan Chang MRN: [id number] Date of Birth: 1941/02/13  Today's Date: 11/01/2019 SLP Individual Time: 0830-0859 SLP Individual Time Calculation (min): 29 min  Short Term Goals: Week 1: SLP Short Term Goal 1 (Week 1): Pt will recall functional daily and new information with Min A for use of aids or strategies. SLP Short Term Goal 2 (Week 1): Pt will demonstrate ability to problem solve mildly complex functional tasks with Min A verbal/visual cues. SLP Short Term Goal 3 (Week 1): Pt will anticipate 2 activities he will need assistance with at home (based on current deficits) with Mod A verbal/visual cues.  Skilled Therapeutic Interventions: Pt was seen for skilled ST targeting cognition. SLP facilitated session with Moderate verbal and visual cueing for sustained attention and problem solving during a semi-complex monthly scheduling task. He demonstrated relatively good insight into the level of difficulty this task presented him - stated he needed "a lot" of help when questioned by SLP. His handwriting was also illegible, therefore accommodations made to assist during task. Min A verbal cues provided for anticipating he would need assistance with writing on his calendar at home. Pt left sitting in recliner with alarm set and needs within reach. Continue per current plan of care.          Pain Pain Assessment Pain Scale: Faces Faces Pain Scale: No hurt  Therapy/Group: Individual Therapy  [name] 11/01/2019, 7:04 AM

## 2019-11-01 NOTE — Progress Notes (Signed)
Jordan Chang PHYSICAL MEDICINE & REHABILITATION PROGRESS NOTE   Subjective/Complaints:  In good spirits. Happy with improvement in RUE. Working very hard to make it stronger   ROS: Patient denies fever, rash, sore throat, blurred vision, nausea, vomiting, diarrhea, cough, shortness of breath or chest pain,  headache, or mood change.    Objective:   No results found. No results for input(s): WBC, HGB, HCT, PLT in the last 72 hours. No results for input(s): NA, K, CL, CO2, GLUCOSE, BUN, CREATININE, CALCIUM in the last 72 hours.  Intake/Output Summary (Last 24 hours) at 11/01/2019 1205 Last data filed at 11/01/2019 0803 Gross per 24 hour  Intake 1020 ml  Output 150 ml  Net 870 ml     Physical Exam: Vital Signs Blood pressure 137/67, pulse 60, temperature 97.7 F (36.5 C), temperature source Oral, resp. rate 18, height 5\' 11"  (1.803 m), weight 129.1 kg, SpO2 96 %.   Constitutional: No distress . Vital signs reviewed. HEENT: EOMI, oral membranes moist Neck: supple Cardiovascular: RRR without murmur. No JVD    Respiratory/Chest: CTA Bilaterally without wheezes or rales. Normal effort    GI/Abdomen: BS +, non-tender, non-distended Ext: no clubbing, cyanosis, or edema Psych: pleasant and cooperative Skin: No evidence of breakdown, no evidence of rash Neurologic: Cranial nerves II through XII intact, motor strength is 5/5 in left and 4- to 4/5 Right  deltoid, bicep, tricep, grip, hip flexor, knee extensors, ankle dorsiflexor and plantar flexor sensation intact LT BUE and BLE Musculoskeletal:full ROM, no pain    Assessment/Plan: 1. Functional deficits secondary to bilateral CVA which require 3+ hours per day of interdisciplinary therapy in a comprehensive inpatient rehab setting.  Physiatrist is providing close team supervision and 24 hour management of active medical problems listed below.  Physiatrist and rehab team continue to assess barriers to discharge/monitor patient  progress toward functional and medical goals  Care Tool:  Bathing  Bathing activity did not occur: Safety/medical concerns Body parts bathed by patient: Right arm, Chest, Abdomen, Right upper leg, Left upper leg, Face, Front perineal area, Left arm, Buttocks   Body parts bathed by helper: Right lower leg, Left lower leg     Bathing assist Assist Level: Minimal Assistance - Patient > 75%     Upper Body Dressing/Undressing Upper body dressing Upper body dressing/undressing activity did not occur (including orthotics): Refused What is the patient wearing?: Pull over shirt    Upper body assist Assist Level: Contact Guard/Touching assist    Lower Body Dressing/Undressing Lower body dressing      What is the patient wearing?: Underwear/pull up, Pants     Lower body assist Assist for lower body dressing: Moderate Assistance - Patient 50 - 74%     Toileting Toileting    Toileting assist Assist for toileting: Maximal Assistance - Patient 25 - 49% Assistive Device Comment: Urinal   Transfers Chair/bed transfer  Transfers assist  Chair/bed transfer activity did not occur: Safety/medical concerns  Chair/bed transfer assist level: Maximal Assistance - Patient 25 - 49%     Locomotion Ambulation   Ambulation assist   Ambulation activity did not occur: Safety/medical concerns    Assistive device: (Stedy)     Walk 10 feet activity   Assist  Walk 10 feet activity did not occur: Safety/medical concerns        Walk 50 feet activity   Assist Walk 50 feet with 2 turns activity did not occur: Safety/medical concerns         Walk  150 feet activity   Assist Walk 150 feet activity did not occur: Safety/medical concerns         Walk 10 feet on uneven surface  activity   Assist Walk 10 feet on uneven surfaces activity did not occur: Safety/medical concerns         Wheelchair     Assist Will patient use wheelchair at discharge?: Yes Type of  Wheelchair: Manual    Wheelchair assist level: Minimal Assistance - Patient > 75% Max wheelchair distance: 65'    Wheelchair 50 feet with 2 turns activity    Assist        Assist Level: Moderate Assistance - Patient 50 - 74%   Wheelchair 150 feet activity     Assist      Assist Level: Total Assistance - Patient < 25%   Blood pressure 137/67, pulse 60, temperature 97.7 F (36.5 C), temperature source Oral, resp. rate 18, height 5\' 11"  (1.803 m), weight 129.1 kg, SpO2 96 %.  Medical Problem List and Plan: 1.  Right-sided weakness with dysarthria secondary to acute to subacute infarct multiple vascular territories involving bilateral brain parenchyma(left pons, inferior right cerebellum, left frontal lobe and right parietal lobe).  Small chronic right cerebellar infarct             -patient may  shower             -ELOS/Goals: 2- 2.5 weeks- goals Mod I to supervision 2.  Antithrombotics: -DVT/anticoagulation: Lovenox             -antiplatelet therapy: Aspirin 81 mg daily, Plavix 75 mg daily 3. Pain Management: Neurontin 600 mg 3 times daily, Flexeril as needed 4. Mood: Provide emotional support             -antipsychotic agents: N/A 5. Neuropsych: This patient is capable of making decisions on his own behalf. 6. Skin/Wound Care: Routine skin checks 7. Fluids/Electrolytes/Nutrition: Routine in and outs with follow-up chemistries 8.  Diabetes mellitus with peripheral neuropathy.  Hemoglobin A1c 6.6.  SSI. CBG (last 3)  Recent Labs    10/31/19 2120 11/01/19 0622 11/01/19 1142  GLUCAP 115* 104* 84  Controlled 5/8 9.  Permissive hypertension.  Monitor with increased mobility.  Patient on Cozaar 50 mg daily, Maxide 37.5-25 mg daily prior to admission.  Resume as needed Vitals:   10/31/19 2028 11/01/19 0320  BP: (!) 155/69 137/67  Pulse: 68 60  Resp: 16 18  Temp: (!) 97.4 F (36.3 C) 97.7 F (36.5 C)  SpO2: 100% 96%  Controlled off antihypertensive medications  5/8 10.  Hyperlipidemia.  Lipitor 11. Gout flare- pt has had before- will start Colchicine 0.6 mg BID and also Prednisone 30 mg x3days -will taper to 20 mg x 3 days---seems improved    LOS: 4 days A FACE TO FACE EVALUATION WAS PERFORMED  7/8 11/01/2019, 12:05 PM

## 2019-11-02 LAB — GLUCOSE, CAPILLARY
Glucose-Capillary: 105 mg/dL — ABNORMAL HIGH (ref 70–99)
Glucose-Capillary: 107 mg/dL — ABNORMAL HIGH (ref 70–99)
Glucose-Capillary: 86 mg/dL (ref 70–99)
Glucose-Capillary: 129 mg/dL — ABNORMAL HIGH (ref 70–99)

## 2019-11-02 NOTE — Plan of Care (Signed)
  Problem: RH SAFETY Goal: RH STG ADHERE TO SAFETY PRECAUTIONS W/ASSISTANCE/DEVICE Description: STG Adhere to Safety Precautions With cues and reminders Outcome: Progressing   Problem: RH PAIN MANAGEMENT Goal: RH STG PAIN MANAGED AT OR BELOW PT'S PAIN GOAL Description: Pain level less than 4 on scale of 0-10 Outcome: Progressing   

## 2019-11-03 ENCOUNTER — Inpatient Hospital Stay (HOSPITAL_COMMUNITY): Payer: PPO | Admitting: Occupational Therapy

## 2019-11-03 ENCOUNTER — Inpatient Hospital Stay (HOSPITAL_COMMUNITY): Payer: PPO | Admitting: Speech Pathology

## 2019-11-03 ENCOUNTER — Inpatient Hospital Stay (HOSPITAL_COMMUNITY): Payer: PPO | Admitting: Physical Therapy

## 2019-11-03 LAB — GLUCOSE, CAPILLARY
Glucose-Capillary: 100 mg/dL — ABNORMAL HIGH (ref 70–99)
Glucose-Capillary: 84 mg/dL (ref 70–99)
Glucose-Capillary: 97 mg/dL (ref 70–99)
Glucose-Capillary: 113 mg/dL — ABNORMAL HIGH (ref 70–99)

## 2019-11-03 MED ORDER — TAMSULOSIN HCL 0.4 MG PO CAPS
0.4000 mg | ORAL_CAPSULE | Freq: Every day | ORAL | Status: DC
  Administered 2019-11-03 – 2019-11-12 (×10): 0.4 mg via ORAL
  Filled 2019-11-03 (×10): qty 1

## 2019-11-03 NOTE — Progress Notes (Signed)
Physical Therapy Session Note  Patient Details  Name: Jordan Chang MRN: [id number] Date of Birth: 04/14/1941  Today's Date: 11/03/2019 PT Individual Time: 1335-1420 PT Individual Time Calculation (min): 45 min   Short Term Goals: Week 1:  PT Short Term Goal 1 (Week 1): Pt will complete least restrictive transfers with max A consistently PT Short Term Goal 2 (Week 1): Pt will initiate gait training as safe and able PT Short Term Goal 3 (Week 1): Pt will perform w/c mobility x 50 ft with min A  Skilled Therapeutic Interventions/Progress Updates: Pt presented in recliner with wife present agreeable to therapy. Pt states feeling much better. Performed ambulatory transfer to w/c with CGA for STS with x 2 attempts and cues for hand placement as pt wanted to pull on RW. Pt transported to day room and performed ambulatory transfer to mat in same manner. PTA noted that pt performed better transfer when attempting to use BUE vs LUE only on w/c. Participated in STS 2 x 5 without AD and mirror feedback for BLE strengthening and midline orientation. Pt then performed additional x 5 with LLE on 2in step for increased RLE recruitment. Pt ambulated to NuStep x 35f with minA nearing CGA with multimodal cues for increased quad recruitment to maintain R knee extension and to facilitation clearance when stepping through on LLE. Pt then participated in NuStep L3 x 6 min for global strengthening with pt maintaining avg 35-40 SPM. Pt then ambulated additional 163fto w/c in same manner as prior with STS improving throughout session from minA to [place] room and performed STS from w/c CGA and ambulated approx 1053fo recliner with continued cues for R knee extension with PTA noting decreased LLE clearance and foot flat landing. Pt returned to recliner at end of session and left with seat alarm on, call bell within reach and needs met.      Therapy Documentation Precautions:   Precautions Precautions: Fall Restrictions Weight Bearing Restrictions: No General:   Vital Signs: Therapy Vitals Temp: 97.7 F (36.5 C) Temp Source: Oral Pulse Rate: 73 Resp: 16 BP: 138/64 Patient Position (if appropriate): Sitting Oxygen Therapy SpO2: 92 % O2 Device: Room Air Pain: Pain Assessment Pain Scale: 0-10 Pain Score: 0-No pain   Therapy/Group: Individual Therapy  Jordanny Waddington  Uva Runkel, PTA  11/03/2019, 3:26 PM

## 2019-11-03 NOTE — Progress Notes (Signed)
Speech Language Pathology Daily Session Note  Patient Details  Name: Jordan Chang MRN: [id number] Date of Birth: [birth year]  Today's Date: 11/03/2019 SLP Individual Time: 7619-5093 SLP Individual Time Calculation (min): 41 min  Short Term Goals: Week 1: SLP Short Term Goal 1 (Week 1): Pt will recall functional daily and new information with Min A for use of aids or strategies. SLP Short Term Goal 2 (Week 1): Pt will demonstrate ability to problem solve mildly complex functional tasks with Min A verbal/visual cues. SLP Short Term Goal 3 (Week 1): Pt will anticipate 2 activities he will need assistance with at home (based on current deficits) with Mod A verbal/visual cues.  Skilled Therapeutic Interventions: Pt was seen for skilled ST targeting cognition. SLP facilitated session with a complex medication management task. Pt required Moderate verbal cues to recall functions of current medications. When provided list as aid he could identify which ones were for his heart function vs pain, etc. He organized a 3X daily pill box with overall Min A verbal and visual cues for problem solving, error awareness and organization. He demonstrated most difficulty with interpreting how to administer 3X daily pill. He sustained attention with Supervision A verbal cues for redirection, much improved over previous sessions. SLP also assisted pt with transfer to restroom, during which only Min A verbal cues were required to reduce impulsivity and safety awareness with use of walker. Pt left in restroom with nursing present to assist with cleaning and transferring pt back to recliner. Continue per current plan of care.       Pain Pain Assessment Pain Scale: 0-10 Pain Score: 0-No pain  Therapy/Group: Individual Therapy  Jordan Chang 11/03/2019, 6:54 AM

## 2019-11-03 NOTE — Progress Notes (Signed)
Occupational Therapy Session Note  Patient Details  Name: Jordan Chang MRN: [id number] Date of Birth: 29-Apr-1941  Today's Date: 11/03/2019 OT Individual Time: 7408-1448 and 1030-1130 OT Individual Time Calculation (min): 40 min and 60 mins   Short Term Goals: Week 1:  OT Short Term Goal 1 (Week 1): Pt will perform toilet transfer with mod A overall. OT Short Term Goal 2 (Week 1): Pt will perform toileting with mod A overall. OT Short Term Goal 3 (Week 1): Pt will perform LB dressing with mod A overall.  Skilled Therapeutic Interventions/Progress Updates:    Session 1: Upon entering the room, pt seated in recliner chair and finishing breakfast. Pt demonstrated ability to pick up drink cup with R UE and bring to mouth without issue which is an improvement from last time this therapist saw pt. OT reviewed therapy schedule with pt as well. Pt then returning demonstrations for R UE coordination and B UE coordination exercises with pt continuing to have decreased speed and dexterity mostly with 4th and 5th digits. Pt remained in recliner chair with chair alarm activated and call bell within reach. Plans for shower at next session.   Session 2: Upon entering the room, pt seated in wheelchair and asleep. Pt was easily aroused for OT participation and excited about shower this session. OT assisted pt with locating clothing items and they were left on bed. Pt standing from recliner chair with min guard and ambulating with RW and min guard - min A into bathroom for toileting needs. Pt given increased time and encouraged to pushing clothing items down himself. Pt completed doffing clothing while seated on toilet. Pt then transferred from toilet to TTB with min A for safety. Pt remained seated on bench while bathing during this session. Pt able to utilize R UE to wash hair this session and able to reach over and wash L UE as well. Pt needing assistance to wash B feet and OT discussed getting him a LH reacher  for next session to increase independence. Pt exiting the shower and returning to sit in recliner chair for rest break. Pt needing assistance threading pants over B feet and pulling over B hips. Pt remained in recliner chair at end of session with call bell and all needed items within reach.   Therapy Documentation Precautions:  Precautions Precautions: Fall Restrictions Weight Bearing Restrictions: No General:   Vital Signs:   Pain: Pain Assessment Pain Scale: 0-10 Pain Score: 0-No pain Pain Type: Acute pain Pain Location: Axilla Pain Orientation: Right Pain Descriptors / Indicators: Discomfort Pain Frequency: Intermittent Pain Onset: Gradual Patients Stated Pain Goal: 0 Pain Intervention(s): Medication (See eMAR) ADL:   Vision   Perception    Praxis   Exercises:   Other Treatments:     Therapy/Group: Individual Therapy  Alen Bleacher 11/03/2019, 8:27 AM

## 2019-11-03 NOTE — Progress Notes (Signed)
[facility] PHYSICAL MEDICINE & REHABILITATION PROGRESS NOTE   Subjective/Complaints:  Discussed small volume voids, took flomax at home, discussed no return to driving until RUE strenght improved   ROS: Patient denies CP, SOB, N/V/D Objective:   No results found. No results for input(s): WBC, HGB, HCT, PLT in the last 72 hours. No results for input(s): NA, K, CL, CO2, GLUCOSE, BUN, CREATININE, CALCIUM in the last 72 hours.  Intake/Output Summary (Last 24 hours) at 11/03/2019 0921 Last data filed at 11/03/2019 0305 Gross per 24 hour  Intake 250 ml  Output 250 ml  Net 0 ml     Physical Exam: Vital Signs Blood pressure 121/72, pulse 70, temperature 97.6 F (36.4 C), temperature source Oral, resp. rate 20, height 5\' 11"  (1.803 m), weight 129.1 kg, SpO2 96 %.    General: No acute distress Mood and affect are appropriate Heart: Regular rate and rhythm no rubs murmurs or extra sounds Lungs: Clear to auscultation, breathing unlabored, no rales or wheezes Abdomen: Positive bowel sounds, soft nontender to palpation, nondistended Extremities: No clubbing, cyanosis, or edema Skin: No evidence of breakdown, no evidence of rash  Neurologic: Cranial nerves II through XII intact, motor strength is 5/5 in left and 4- to 4/5 Right  deltoid, bicep, tricep, grip, hip flexor, knee extensors, ankle dorsiflexor and plantar flexor sensation intact LT BUE and BLE Musculoskeletal:full ROM, no pain    Assessment/Plan: 1. Functional deficits secondary to bilateral CVA which require 3+ hours per day of interdisciplinary therapy in a comprehensive inpatient rehab setting.  Physiatrist is providing close team supervision and 24 hour management of active medical problems listed below.  Physiatrist and rehab team continue to assess barriers to discharge/monitor patient progress toward functional and medical goals  Care Tool:  Bathing  Bathing activity did not occur: Safety/medical concerns Body  parts bathed by patient: Right arm, Chest, Abdomen, Right upper leg, Left upper leg, Face, Front perineal area, Left arm, Buttocks   Body parts bathed by helper: Right lower leg, Left lower leg     Bathing assist Assist Level: Minimal Assistance - Patient > 75%     Upper Body Dressing/Undressing Upper body dressing Upper body dressing/undressing activity did not occur (including orthotics): Refused What is the patient wearing?: Pull over shirt    Upper body assist Assist Level: Contact Guard/Touching assist    Lower Body Dressing/Undressing Lower body dressing      What is the patient wearing?: Underwear/pull up, Pants     Lower body assist Assist for lower body dressing: Moderate Assistance - Patient 50 - 74%     Toileting Toileting    Toileting assist Assist for toileting: Minimal Assistance - Patient > 75% Assistive Device Comment: Urinal   Transfers Chair/bed transfer  Transfers assist  Chair/bed transfer activity did not occur: Safety/medical concerns  Chair/bed transfer assist level: Minimal Assistance - Patient > 75%     Locomotion Ambulation   Ambulation assist   Ambulation activity did not occur: Safety/medical concerns  Assist level: Moderate Assistance - Patient 50 - 74% Assistive device: Walker-rolling Max distance: 135ft   Walk 10 feet activity   Assist  Walk 10 feet activity did not occur: Safety/medical concerns  Assist level: Moderate Assistance - Patient - 50 - 74% Assistive device: Walker-rolling   Walk 50 feet activity   Assist Walk 50 feet with 2 turns activity did not occur: Safety/medical concerns  Assist level: Moderate Assistance - Patient - 50 - 74% Assistive device: Walker-rolling  Walk 150 feet activity   Assist Walk 150 feet activity did not occur: Safety/medical concerns         Walk 10 feet on uneven surface  activity   Assist Walk 10 feet on uneven surfaces activity did not occur: Safety/medical  concerns         Wheelchair     Assist Will patient use wheelchair at discharge?: Yes Type of Wheelchair: Manual    Wheelchair assist level: Minimal Assistance - Patient > 75% Max wheelchair distance: 36'    Wheelchair 50 feet with 2 turns activity    Assist        Assist Level: Moderate Assistance - Patient 50 - 74%   Wheelchair 150 feet activity     Assist      Assist Level: Total Assistance - Patient < 25%   Blood pressure 121/72, pulse 70, temperature 97.6 F (36.4 C), temperature source Oral, resp. rate 20, height 5\' 11"  (1.803 m), weight 129.1 kg, SpO2 96 %.  Medical Problem List and Plan: 1.  Right-sided weakness with dysarthria secondary to acute to subacute infarct multiple vascular territories involving bilateral brain parenchyma(left pons, inferior right cerebellum, left frontal lobe and right parietal lobe).  Small chronic right cerebellar infarct             -patient may  shower             -ELOS/Goals: 2- 2.5 weeks- goals Mod I to supervision 2.  Antithrombotics: -DVT/anticoagulation: Lovenox             -antiplatelet therapy: Aspirin 81 mg daily, Plavix 75 mg daily 3. Pain Management: Neurontin 600 mg 3 times daily, Flexeril as needed 4. Mood: Provide emotional support             -antipsychotic agents: N/A 5. Neuropsych: This patient is capable of making decisions on his own behalf. 6. Skin/Wound Care: Routine skin checks 7. Fluids/Electrolytes/Nutrition: Routine in and outs with follow-up chemistries 8.  Diabetes mellitus with peripheral neuropathy.  Hemoglobin A1c 6.6.  SSI. CBG (last 3)  Recent Labs    11/02/19 1658 11/02/19 2055 11/03/19 0606  GLUCAP 86 129* 100*  Controlled 5/10 9.  Permissive hypertension.  Monitor with increased mobility.  Patient on Cozaar 50 mg daily, Maxide 37.5-25 mg daily prior to admission.  Resume as needed Vitals:   11/02/19 1932 11/03/19 0312  BP: 138/67 121/72  Pulse: 79 70  Resp: 20 20  Temp:  98.8 F (37.1 C) 97.6 F (36.4 C)  SpO2: 92% 96%  Controlled off antihypertensive medications 5/10 10.  Hyperlipidemia.  Lipitor 11. Gout flare- pt has had before- will start Colchicine 0.6 mg BID off Prednisone without recurrence   12.  Hx BPH will restart Flomax and monitor for orthostatic drops  LOS: 6 days A FACE TO FACE EVALUATION WAS PERFORMED  01/03/20 11/03/2019, 9:21 AM

## 2019-11-04 ENCOUNTER — Inpatient Hospital Stay (HOSPITAL_COMMUNITY): Payer: PPO | Admitting: Occupational Therapy

## 2019-11-04 ENCOUNTER — Inpatient Hospital Stay (HOSPITAL_COMMUNITY): Payer: PPO | Admitting: Physical Therapy

## 2019-11-04 ENCOUNTER — Inpatient Hospital Stay (HOSPITAL_COMMUNITY): Payer: PPO

## 2019-11-04 LAB — GLUCOSE, CAPILLARY
Glucose-Capillary: 105 mg/dL — ABNORMAL HIGH (ref 70–99)
Glucose-Capillary: 78 mg/dL (ref 70–99)
Glucose-Capillary: 108 mg/dL — ABNORMAL HIGH (ref 70–99)
Glucose-Capillary: 141 mg/dL — ABNORMAL HIGH (ref 70–99)

## 2019-11-04 NOTE — Progress Notes (Signed)
[facility] PHYSICAL MEDICINE & REHABILITATION PROGRESS NOTE   Subjective/Complaints:  No issues overnite   ROS: Patient denies CP, SOB, N/V/D Objective:   No results found. No results for input(s): WBC, HGB, HCT, PLT in the last 72 hours. No results for input(s): NA, K, CL, CO2, GLUCOSE, BUN, CREATININE, CALCIUM in the last 72 hours.  Intake/Output Summary (Last 24 hours) at 11/04/2019 0910 Last data filed at 11/04/2019 0711 Gross per 24 hour  Intake 840 ml  Output 350 ml  Net 490 ml     Physical Exam: Vital Signs Blood pressure 128/69, pulse 78, temperature 97.6 F (36.4 C), temperature source Oral, resp. rate 18, height 5\' 11"  (1.803 m), weight 129.1 kg, SpO2 98 %.    General: No acute distress Mood and affect are appropriate Heart: Regular rate and rhythm no rubs murmurs or extra sounds Lungs: Clear to auscultation, breathing unlabored, no rales or wheezes Abdomen: Positive bowel sounds, soft nontender to palpation, nondistended Extremities: No clubbing, cyanosis, or edema Skin: No evidence of breakdown, no evidence of rash  Neurologic: Cranial nerves II through XII intact, motor strength is 5/5 in left and 4- to 4/5 Right  deltoid, bicep, tricep, grip, hip flexor, knee extensors, ankle dorsiflexor and plantar flexor sensation intact LT BUE and BLE Musculoskeletal:full ROM, no pain    Assessment/Plan: 1. Functional deficits secondary to bilateral CVA which require 3+ hours per day of interdisciplinary therapy in a comprehensive inpatient rehab setting.  Physiatrist is providing close team supervision and 24 hour management of active medical problems listed below.  Physiatrist and rehab team continue to assess barriers to discharge/monitor patient progress toward functional and medical goals  Care Tool:  Bathing  Bathing activity did not occur: Safety/medical concerns Body parts bathed by patient: Right arm, Chest, Abdomen, Right upper leg, Left upper leg, Face,  Front perineal area, Left arm, Buttocks   Body parts bathed by helper: Right lower leg, Left lower leg     Bathing assist Assist Level: Minimal Assistance - Patient > 75%     Upper Body Dressing/Undressing Upper body dressing Upper body dressing/undressing activity did not occur (including orthotics): Refused What is the patient wearing?: Pull over shirt    Upper body assist Assist Level: Set up assist    Lower Body Dressing/Undressing Lower body dressing      What is the patient wearing?: Underwear/pull up, Pants     Lower body assist Assist for lower body dressing: Minimal Assistance - Patient > 75%     Toileting Toileting    Toileting assist Assist for toileting: Minimal Assistance - Patient > 75% Assistive Device Comment: Urinal   Transfers Chair/bed transfer  Transfers assist  Chair/bed transfer activity did not occur: Safety/medical concerns  Chair/bed transfer assist level: Contact Guard/Touching assist     Locomotion Ambulation   Ambulation assist   Ambulation activity did not occur: Safety/medical concerns  Assist level: Minimal Assistance - Patient > 75% Assistive device: Walker-rolling Max distance: 83ft   Walk 10 feet activity   Assist  Walk 10 feet activity did not occur: Safety/medical concerns  Assist level: Minimal Assistance - Patient > 75% Assistive device: Walker-rolling   Walk 50 feet activity   Assist Walk 50 feet with 2 turns activity did not occur: Safety/medical concerns  Assist level: Moderate Assistance - Patient - 50 - 74% Assistive device: Walker-rolling    Walk 150 feet activity   Assist Walk 150 feet activity did not occur: Safety/medical concerns  Walk 10 feet on uneven surface  activity   Assist Walk 10 feet on uneven surfaces activity did not occur: Safety/medical concerns         Wheelchair     Assist Will patient use wheelchair at discharge?: Yes Type of Wheelchair: Manual     Wheelchair assist level: Minimal Assistance - Patient > 75% Max wheelchair distance: 79'    Wheelchair 50 feet with 2 turns activity    Assist        Assist Level: Moderate Assistance - Patient 50 - 74%   Wheelchair 150 feet activity     Assist      Assist Level: Total Assistance - Patient < 25%   Blood pressure 128/69, pulse 78, temperature 97.6 F (36.4 C), temperature source Oral, resp. rate 18, height 5\' 11"  (1.803 m), weight 129.1 kg, SpO2 98 %.  Medical Problem List and Plan: 1.  Right-sided weakness with dysarthria secondary to acute to subacute infarct multiple vascular territories involving bilateral brain parenchyma(left pons, inferior right cerebellum, left frontal lobe and right parietal lobe).  Small chronic right cerebellar infarct             -patient may  shower             -ELOS/Goals: 2- 2.5 weeks- goals Mod I to supervision 2.  Antithrombotics: -DVT/anticoagulation: Lovenox             -antiplatelet therapy: Aspirin 81 mg daily, Plavix 75 mg daily 3. Pain Management: Neurontin 600 mg 3 times daily, Flexeril as needed 4. Mood: Provide emotional support             -antipsychotic agents: N/A 5. Neuropsych: This patient is capable of making decisions on his own behalf. 6. Skin/Wound Care: Routine skin checks 7. Fluids/Electrolytes/Nutrition: Routine in and outs with follow-up chemistries 8.  Diabetes mellitus with peripheral neuropathy.  Hemoglobin A1c 6.6.  SSI. CBG (last 3)  Recent Labs    11/03/19 1629 11/03/19 2054 11/04/19 0608  GLUCAP 97 113* 105*  Controlled 5/11 9.  Permissive hypertension.  Monitor with increased mobility.  Patient on Cozaar 50 mg daily, Maxide 37.5-25 mg daily prior to admission.  Resume as needed Vitals:   11/04/19 0556 11/04/19 0559  BP: (!) 141/65 128/69  Pulse: 70 78  Resp:    Temp:    SpO2: (!) 89% 98%  Controlled off antihypertensive medications 5/11 10.  Hyperlipidemia.  Lipitor 11. Gout flare- pt has  had before- will start Colchicine 0.6 mg BID off Prednisone without recurrence   12.  Hx BPH will restart Flomax and monitor for orthostatic drops  LOS: 7 days A FACE TO FACE EVALUATION WAS PERFORMED  7/11 11/04/2019, 9:10 AM

## 2019-11-04 NOTE — Progress Notes (Signed)
Occupational Therapy Session Note  Patient Details  Name: Jordan Chang MRN: [id number] Date of Birth: [birth year]  Today's Date: 11/04/2019 OT Individual Time: 0704-0800 and 1400-1500 OT Individual Time Calculation (min): 56 min and 60 mins   Short Term Goals: Week 1:  OT Short Term Goal 1 (Week 1): Pt will perform toilet transfer with mod A overall. OT Short Term Goal 2 (Week 1): Pt will perform toileting with mod A overall. OT Short Term Goal 3 (Week 1): Pt will perform LB dressing with mod A overall.  Skilled Therapeutic Interventions/Progress Updates:    Session 1: Upon entering the room, pt's NT present in the room getting pt up to go to bathroom. OT taking over assistance as pt ambulates with RW and min guard into bathroom with min cuing for forward head. Pt seated on commode but unable to have BM this session. Clothing is soiled and changed while seated on commode with assistance needed to thread over B feet and pt needing min A and increased encouragement to pull over B hips. Pt exiting the room and performing hand hygiene and grooming while standing for 7  Minutes with S - min guard for safety. Pt returning to recliner chair for R UE NMR with resistive foam pieces and writing activities. Pt given two mazes of moderate difficulty with more errors occurring on 90 degree angles verses curved maze. Pt remained in recliner chair at end of session with call bell and all needed items within reach.   Session 2: Upon entering the room, pt seated in wheelchair with wife present in the room. Pt with no c/o pain and agreeable to shower this session. Pt standing from recliner chair with min A and ambulating into bathroom with min guard. Pt transferred into shower with RW and grab bar with min A. Pt seated on TTB for bathing this session. OT provided education on use of LH sponge to wash back and B feet with pt returning demonstration this session. Pt needing assistance to wash buttocks. Pt drying and  returning to sit in recliner chair for dressing tasks. OT educated and demonstrated use of LH reacher to thread pants onto B feet with pt returning demonstrations with min guard for balance. Pt able to pull underwear and pants up over B hips when they were done individually this session. Pt needing assistance to don B socks secondary to edema. Pt remained in recliner chair with wife present in the room. Call bell and all needed items within reach.   Therapy Documentation Precautions:  Precautions Precautions: Fall Restrictions Weight Bearing Restrictions: No   Therapy/Group: Individual Therapy  Alen Bleacher 11/04/2019, 12:50 PM

## 2019-11-04 NOTE — Progress Notes (Signed)
Physical Therapy Session Note  Patient Details  Name: Jordan Chang MRN: [id number] Date of Birth: 1940/07/18  Today's Date: 11/04/2019 PT Individual Time: 0920-1010 PT Individual Time Calculation (min): 50 min   Short Term Goals: Week 1:  PT Short Term Goal 1 (Week 1): Pt will complete least restrictive transfers with max A consistently PT Short Term Goal 2 (Week 1): Pt will initiate gait training as safe and able PT Short Term Goal 3 (Week 1): Pt will perform w/c mobility x 50 ft with min A  Skilled Therapeutic Interventions/Progress Updates: Pt presented in recliner agreeable to therapy. Pt able to perform STS from recliner with CGA and perform ambulatory transfer w/c. PTA was able to successfully don shoes once grip socks removed. Pt transported to rehab gym and performed ambulatory transfer to mat with minA for STS. Pt participated in LAQ 2 x 10 and hamstring pulls with level 1 resistance band with emphasis on increasing range for forced use of RLE. Performed horiz shoulder abd with level 1 resistance band per pt request 2 x 10 with tactile cues to decrease compensatory strategies. Pt participated in x 2 bouts of horseshoes for forced use of RUE. First bout performed with RW with pt able to reach outside BOS with moderate challenges. Pt then performed x1 bout without AD with pt able to maintain fair static standing balance with minimal challenges reaching outside BOS. After seated rest pt ambulated 41ft with RW and CGA with pt demonstrating improved TKE in stance phase and able to maintain midline. Pt transported remaining distance back to room and pt indicated need for void. Pt ambulated to toilet with RW and CGA and required minA for LB clothing management. Pt left at toilet verbalizing understanding to use pull cord once completed and pt's nurse and NT notified of pt's disposition.      Therapy Documentation Precautions:  Precautions Precautions: Fall Restrictions Weight Bearing  Restrictions: No General:   Vital Signs: Therapy Vitals Temp: 98.1 F (36.7 C) Temp Source: Oral Pulse Rate: (!) 58 Resp: 16 BP: (!) 144/63 Patient Position (if appropriate): Sitting Oxygen Therapy SpO2: 97 % Pain:   Mobility:   Locomotion :    Trunk/Postural Assessment :    Balance:   Exercises:   Other Treatments:      Therapy/Group: Individual Therapy  Lacee Grey 11/04/2019, 4:22 PM

## 2019-11-04 NOTE — Plan of Care (Signed)
Goals upgraded secondary to progress

## 2019-11-04 NOTE — Progress Notes (Signed)
Speech Language Pathology Daily Session Note  Patient Details  Name: Jordan Chang MRN: [id number] Date of Birth: [birth year]  Today's Date: 11/04/2019 SLP Individual Time: 1443-1540 SLP Individual Time Calculation (min): 29 min  Short Term Goals: Week 1: SLP Short Term Goal 1 (Week 1): Pt will recall functional daily and new information with Min A for use of aids or strategies. SLP Short Term Goal 2 (Week 1): Pt will demonstrate ability to problem solve mildly complex functional tasks with Min A verbal/visual cues. SLP Short Term Goal 3 (Week 1): Pt will anticipate 2 activities he will need assistance with at home (based on current deficits) with Mod A verbal/visual cues.   Skilled Therapeutic Interventions: Skilled ST services focused on education and cognitive skills. Pt's wife was present for treatment session, SLP provided education pertaining to assistance for higher level cognitive task such as medication, time and money management. Pt's wife agreed and all questions were answered to satisfaction. SLP facilitated problem solving and recall with aid in novel card task, pt required supervision A fade to mod I for basic problem solving, min A verbal cues for mildly complex problem solving skill and supervision A verbal cues to recall three rules with aid. Pt was left in room with wife present, call bell within reach and chair alarm set. ST recommends to continue skilled ST services.      Pain Pain Assessment Pain Score: 0-No pain  Therapy/Group: Individual Therapy  Samyah Bilbo  [facility] 11/04/2019, 5:11 PM

## 2019-11-04 NOTE — Plan of Care (Signed)
  Problem: RH SAFETY Goal: RH STG ADHERE TO SAFETY PRECAUTIONS W/ASSISTANCE/DEVICE Description: STG Adhere to Safety Precautions With cues and reminders Outcome: Progressing   Problem: RH PAIN MANAGEMENT Goal: RH STG PAIN MANAGED AT OR BELOW PT'S PAIN GOAL Description: Pain level less than 4 on scale of 0-10 Outcome: Progressing   

## 2019-11-05 ENCOUNTER — Inpatient Hospital Stay (HOSPITAL_COMMUNITY): Payer: PPO

## 2019-11-05 ENCOUNTER — Inpatient Hospital Stay (HOSPITAL_COMMUNITY): Payer: PPO | Admitting: Occupational Therapy

## 2019-11-05 ENCOUNTER — Inpatient Hospital Stay (HOSPITAL_COMMUNITY): Payer: PPO | Admitting: Physical Therapy

## 2019-11-05 LAB — GLUCOSE, CAPILLARY
Glucose-Capillary: 105 mg/dL — ABNORMAL HIGH (ref 70–99)
Glucose-Capillary: 66 mg/dL — ABNORMAL LOW (ref 70–99)
Glucose-Capillary: 107 mg/dL — ABNORMAL HIGH (ref 70–99)
Glucose-Capillary: 78 mg/dL (ref 70–99)

## 2019-11-05 NOTE — Progress Notes (Signed)
Physical Therapy Session Note  Patient Details  Name: Jordan Chang MRN: [id number] Date of Birth: [birth year]  Today's Date: 11/05/2019 PT Individual Time: 4098-1191 PT Individual Time Calculation (min): 32 min   Short Term Goals: Week 1:  PT Short Term Goal 1 (Week 1): Pt will complete least restrictive transfers with max A consistently PT Short Term Goal 2 (Week 1): Pt will initiate gait training as safe and able PT Short Term Goal 3 (Week 1): Pt will perform w/c mobility x 50 ft with min A  Skilled Therapeutic Interventions/Progress Updates:     Pt received seated in recliner. Agreeable to therapy. No report of pain. Session focuses on gait and endurance training.  Sit to stand with CGA. Pt ambulates bouts of 250' and 325' with extended seated rest break. Pt maintains forward flexed posture throughout ambulation but is able to slightly correct with cues from PT. Cues required for hand placement to assist with safe sit to stand transfer and for increased hip and trunk extension for improved balance and safety during gait.  Pt left seated in recliner with alarm intact and all needs within reach.  Therapy Documentation Precautions:  Precautions Precautions: Fall Restrictions Weight Bearing Restrictions: No    Therapy/Group: Individual Therapy  Beau Fanny, PT, DPT 11/05/2019, 12:53 PM

## 2019-11-05 NOTE — Plan of Care (Signed)
  Problem: RH SAFETY Goal: RH STG ADHERE TO SAFETY PRECAUTIONS W/ASSISTANCE/DEVICE Description: STG Adhere to Safety Precautions With cues and reminders Outcome: Progressing   

## 2019-11-05 NOTE — Progress Notes (Signed)
Speech Language Pathology Daily Session Note  Patient Details  Name: Jordan Chang MRN: [id number] Date of Birth: 12-03-1940  Today's Date: 11/05/2019 SLP Individual Time: 1345-1407 SLP Individual Time Calculation (min): 22 min  Short Term Goals: Week 1: SLP Short Term Goal 1 (Week 1): Pt will recall functional daily and new information with Min A for use of aids or strategies. SLP Short Term Goal 2 (Week 1): Pt will demonstrate ability to problem solve mildly complex functional tasks with Min A verbal/visual cues. SLP Short Term Goal 3 (Week 1): Pt will anticipate 2 activities he will need assistance with at home (based on current deficits) with Mod A verbal/visual cues.  Skilled Therapeutic Interventions: Skilled treatment session focused on cognitive goals. Patient's wife present and reported she felt the patient's memory was at baseline due to his ability to recall his time in the service, etc. SLP provided education regarding the difference between long-term and short-term memory. She verbalized understanding. Patient independently recalled his current medications but both and his wife report they would like patient to utilize a pill box at home despite his previous medication administration method at home. Educated both of them that it will be addressed during next therapy session. Patient handed off to OT. Continue with current plan of care.      Pain No/Denies Pain   Therapy/Group: Individual Therapy  Briahna Pescador 11/05/2019, 3:13 PM

## 2019-11-05 NOTE — Progress Notes (Signed)
Physical Therapy Weekly Progress Note  Patient Details  Name: Jordan Chang MRN: [id number] Date of Birth: [birth year]  Beginning of progress report period: Oct 29, 2019 End of progress report period: Nov 05, 2019  Today's Date: 11/05/2019  Patient has met 3 of 3 short term goals.  Pt has made significant progress during current week of therapy. Pt's bout of gouts has resolved and has allowed pt to participate significantly more in therapy. Pt is currently minA for STS from lower level surfaces and CGA from higher level, CGA ambulation for distances >150f with RW. Pt continues to require cues for increased TKE in stance phase. Pt has also improved in standing tolerance and will begin stair negotiation in upcoming week.   Patient continues to demonstrate the following deficits muscle weakness, muscle joint tightness and muscle paralysis, decreased cardiorespiratoy endurance and impaired timing and sequencing, decreased coordination and decreased motor planning and therefore will continue to benefit from skilled PT intervention to increase functional independence with mobility.  Patient progressing toward long term goals..  Continue plan of care.  PT Short Term Goals Week 1:  PT Short Term Goal 1 (Week 1): Pt will complete least restrictive transfers with max A consistently PT Short Term Goal 1 - Progress (Week 1): Met PT Short Term Goal 2 (Week 1): Pt will initiate gait training as safe and able PT Short Term Goal 2 - Progress (Week 1): Met PT Short Term Goal 3 (Week 1): Pt will perform w/c mobility x 50 ft with min A PT Short Term Goal 3 - Progress (Week 1): Met Week 2:  PT Short Term Goal 1 (Week 2): STG=LTG    Therapy Documentation Precautions:  Precautions Precautions: Fall Restrictions Weight Bearing Restrictions: No Vital Signs: Therapy Vitals Temp: 97.7 F (36.5 C) Temp Source: Oral Pulse Rate: 88 Resp: 18 BP: (!) 162/74 Patient Position (if appropriate):  Standing Oxygen Therapy SpO2: 97 % O2 Device: Room Air  Therapy/Group: Individual Therapy  [name] 11/05/2019, 4:23 PM

## 2019-11-05 NOTE — Progress Notes (Signed)
Speech Language Pathology Weekly Progress and Session Note  Patient Details  Name: Jordan Chang MRN: [id number] Date of Birth: Jul 10, 1940  Beginning of progress report period: Oct 30, 2019 End of progress report period: Nov 05, 2019  Today's Date: 11/05/2019 SLP Individual Time: 7116-5790 SLP Individual Time Calculation (min): 43 min  Short Term Goals: Week 1: SLP Short Term Goal 1 (Week 1): Pt will recall functional daily and new information with Min A for use of aids or strategies. SLP Short Term Goal 1 - Progress (Week 1): Met SLP Short Term Goal 2 (Week 1): Pt will demonstrate ability to problem solve mildly complex functional tasks with Min A verbal/visual cues. SLP Short Term Goal 2 - Progress (Week 1): Met SLP Short Term Goal 3 (Week 1): Pt will anticipate 2 activities he will need assistance with at home (based on current deficits) with Mod A verbal/visual cues. SLP Short Term Goal 3 - Progress (Week 1): Met    New Short Term Goals: Week 2: SLP Short Term Goal 1 (Week 2): STG=LTG due to short ELOS  Weekly Progress Updates: Pt made great progress meeting 3 out 3 goals this reporting period currently min-supervision A. SLP facilitated functional problem solving skills utilizing medication and time management as well as novel tasks. Pt demonstrated increase of safety awareness and anticipatory awareness. Pt continues to demonstrate increase deficits with novel task and SLP will focus on carryover strategies in novel situations and continued medication management. Pt would continue to benefit from skills ST services in order to maximumize functional indepdence and reduce burden of care, requring 24 hour supervision and continued ST services.     Intensity: Minumum of 1-2 x/day, 30 to 90 minutes Frequency: 3 to 5 out of 7 days Duration/Length of Stay: 5/20 Treatment/Interventions: Cognitive remediation/compensation;Cueing hierarchy;Functional tasks;Patient/family  education;Internal/external aids   Daily Session  Skilled Therapeutic Interventions:  Skilled ST services focused on cognitive skills. Pt demonstrated recall of today's schedule  Mod I to utilize visual aid. SLP facilitated semi-complex problem solving skills in familiar card task, pt required min A verbal cues fade to supervision A verbal cues. SLP also facilitated mildly complex problem solving skills in novel inference organization task, pt required max A fade to mod A verbal cues. Pt continues to demonstrate increase of skill level with increase familiarity of the task. Pt was left in room with call bell within reach and chair alarm set. ST recommends to continue skilled ST services.     General    Pain Pain Assessment Pain Score: 0-No pain  Therapy/Group: Individual Therapy  Tonda Wiederhold  [facility] 11/05/2019, 4:56 PM

## 2019-11-05 NOTE — Patient Care Conference (Signed)
Inpatient RehabilitationTeam Conference and Plan of Care Update Date: 11/05/2019   Time: 3:12 PM    Patient Name: Jordan Chang      Medical Record Number: [id number]  Date of Birth: 12-Nov-1940 Sex: Male         Room/Bed: 4W25C/4W25C-01 Payor Info: Payor: [employer] / Plan: [employer] Must PPO / Product Type: *No Product type* /    Admit Date/Time:  10/28/2019 12:19 PM  Primary Diagnosis:  Left pontine cerebrovascular accident [facility])  Patient Active Problem List   Diagnosis Date Noted  . Hyperlipidemia 10/28/2019  . Left pontine cerebrovascular accident ([place]) 10/28/2019  . Acute CVA (cerebrovascular accident) ([place]) 10/26/2019  . Type 2 diabetes mellitus with other specified complication ([place]) 23/55/7322  . Essential hypertension 10/26/2019  . Chronic back pain 01/22/2016  . Lumbar herniated disc 01/20/2016    Expected Discharge Date: Expected Discharge Date: 11/13/19  Team Members Present: Physician leading conference: Dr. Alysia Penna Care Coodinator Present: Erlene Quan, BSW;Boruch Manuele Tobin Chad, RN, BSN, [place] Nurse Present: Debroah Loop, RN PT Present: Phylliss Bob, PTA OT Present: Darleen Crocker, OT SLP Present: Charolett Bumpers, SLP PPS Coordinator present : Ileana Ladd, Burna Mortimer, SLP     Current Status/Progress Goal Weekly Team Focus  Bowel/Bladder   Patient remains continent of bowel and bladder. Ambulates with walker and assistance to bathroom.  Other times uses urinal at bedside.  LBM 11/04/2019  Patient will maintain continence  Assess bowel sounds and output.  Assist with toileting needs PRN. Encourage balanced nutritional intake.   Swallow/Nutrition/ Hydration             ADL's   min A overall with use of RW  goals were upgraded to supervision overall with min A for LB dressing  R NMR, pt/family education, ADL retraining, functional transfers, endurance   Mobility   minA progressing to CGA transfers, CGA gait with RW up to  64ft with improved RLE clearance and sequencing  min A overall, mod A stairs - pending upgrading goals due to pt's progress  balance, strengthening, endurance, stair training d/c planning   Communication           Problem solving deficits, recall and anticipatory awareness  Safety/Cognition/ Behavioral Observations  Min -supervision A mildly complex, emergent awareness and recall  Supervision A  mildly complex to complex problem solving, recall and emergent/anticipatory awareness   Pain   Patient c/o intermittent arm and shoulder pain.  Relieved with Tylenol PRN  Patient will maintain level less than 4 with activity tolerance  Pain assessment Qshift and PRN   Skin   Patient's skin remains intact.  C/o perineal burning following toileting.  address perineal skin burning, prevent further skin breakdown  Assess skin Qshift and PRN    Rehab Goals Patient on target to meet rehab goals: Yes Rehab Goals Revised: on target with current goals *See Care Plan and progress notes for long and short-term goals.     Barriers to Discharge  Current Status/Progress Possible Resolutions Date Resolved   Nursing        Urinary retention   Check PVRs       PT          on target for discharge          OT        on target for discharge          SLP      on target for discharge  SW Medical stability SW aware of none on target          Discharge Planning/Teaching Needs:  Goal to discharge home  Will schedule if recommended   Team Discussion:  Urinary retention and restarted home medications; check PVRs  Revisions to Treatment Plan:  Upgraded OT/PT goals to supervision overall    Medical Summary Current Status: Right hemiparesis, decreased processing speed Weekly Focus/Goal: Increase activity tolerance  Barriers to Discharge: Medical stability;Weight   Possible Resolutions to Barriers: Continue rehabilitation program   Continued Need for Acute Rehabilitation Level of Care: The patient  requires daily medical management by a physician with specialized training in physical medicine and rehabilitation for the following reasons: Direction of a multidisciplinary physical rehabilitation program to maximize functional independence : Yes Medical management of patient stability for increased activity during participation in an intensive rehabilitation regime.: Yes Analysis of laboratory values and/or radiology reports with any subsequent need for medication adjustment and/or medical intervention. : Yes   I attest that I was present, lead the team conference, and concur with the assessment and plan of the team.   Chana Bode B 11/05/2019, 3:12 PM

## 2019-11-05 NOTE — Progress Notes (Signed)
[facility] PHYSICAL MEDICINE & REHABILITATION PROGRESS NOTE   Subjective/Complaints:  No issues overnite   ROS: Patient denies CP, SOB, N/V/D Objective:   No results found. No results for input(s): WBC, HGB, HCT, PLT in the last 72 hours. No results for input(s): NA, K, CL, CO2, GLUCOSE, BUN, CREATININE, CALCIUM in the last 72 hours.  Intake/Output Summary (Last 24 hours) at 11/05/2019 0906 Last data filed at 11/05/2019 0727 Gross per 24 hour  Intake 882 ml  Output 300 ml  Net 582 ml     Physical Exam: Vital Signs Blood pressure 137/68, pulse 73, temperature (!) 97.4 F (36.3 C), temperature source Oral, resp. rate 16, height 5\' 11"  (1.803 m), weight 129.1 kg, SpO2 98 %.    General: No acute distress Mood and affect are appropriate Heart: Regular rate and rhythm no rubs murmurs or extra sounds Lungs: Clear to auscultation, breathing unlabored, no rales or wheezes Abdomen: Positive bowel sounds, soft nontender to palpation, nondistended Extremities: No clubbing, cyanosis, or edema Skin: No evidence of breakdown, no evidence of rash   Neurologic: Cranial nerves II through XII intact, motor strength is 5/5 in left and 4- to 4/5 Right  deltoid, bicep, tricep, grip, hip flexor, knee extensors, ankle dorsiflexor and plantar flexor sensation intact LT BUE and BLE Musculoskeletal:full ROM, no pain    Assessment/Plan: 1. Functional deficits secondary to bilateral CVA which require 3+ hours per day of interdisciplinary therapy in a comprehensive inpatient rehab setting.  Physiatrist is providing close team supervision and 24 hour management of active medical problems listed below.  Physiatrist and rehab team continue to assess barriers to discharge/monitor patient progress toward functional and medical goals  Care Tool:  Bathing  Bathing activity did not occur: Safety/medical concerns Body parts bathed by patient: Right arm, Chest, Abdomen, Right upper leg, Left upper leg,  Face, Front perineal area, Left arm, Right lower leg, Left lower leg, Buttocks   Body parts bathed by helper: Right lower leg, Left lower leg     Bathing assist Assist Level: Contact Guard/Touching assist     Upper Body Dressing/Undressing Upper body dressing Upper body dressing/undressing activity did not occur (including orthotics): Refused What is the patient wearing?: Pull over shirt    Upper body assist Assist Level: Set up assist    Lower Body Dressing/Undressing Lower body dressing      What is the patient wearing?: Underwear/pull up, Pants     Lower body assist Assist for lower body dressing: Contact Guard/Touching assist     Toileting Toileting    Toileting assist Assist for toileting: Minimal Assistance - Patient > 75% Assistive Device Comment: Urinal   Transfers Chair/bed transfer  Transfers assist  Chair/bed transfer activity did not occur: Safety/medical concerns  Chair/bed transfer assist level: Contact Guard/Touching assist     Locomotion Ambulation   Ambulation assist   Ambulation activity did not occur: Safety/medical concerns  Assist level: Minimal Assistance - Patient > 75% Assistive device: Walker-rolling Max distance: 27ft   Walk 10 feet activity   Assist  Walk 10 feet activity did not occur: Safety/medical concerns  Assist level: Minimal Assistance - Patient > 75% Assistive device: Walker-rolling   Walk 50 feet activity   Assist Walk 50 feet with 2 turns activity did not occur: Safety/medical concerns  Assist level: Moderate Assistance - Patient - 50 - 74% Assistive device: Walker-rolling    Walk 150 feet activity   Assist Walk 150 feet activity did not occur: Safety/medical concerns  Walk 10 feet on uneven surface  activity   Assist Walk 10 feet on uneven surfaces activity did not occur: Safety/medical concerns         Wheelchair     Assist Will patient use wheelchair at discharge?: Yes Type of  Wheelchair: Manual    Wheelchair assist level: Minimal Assistance - Patient > 75% Max wheelchair distance: 92'    Wheelchair 50 feet with 2 turns activity    Assist        Assist Level: Moderate Assistance - Patient 50 - 74%   Wheelchair 150 feet activity     Assist      Assist Level: Total Assistance - Patient < 25%   Blood pressure 137/68, pulse 73, temperature (!) 97.4 F (36.3 C), temperature source Oral, resp. rate 16, height 5\' 11"  (1.803 m), weight 129.1 kg, SpO2 98 %.  Medical Problem List and Plan: 1.  Right-sided weakness with dysarthria secondary to acute to subacute infarct multiple vascular territories involving bilateral brain parenchyma(left pons, inferior right cerebellum, left frontal lobe and right parietal lobe).  Small chronic right cerebellar infarct             -patient may  shower             -ELOS/Goals: 2- 2.5 weeks- goals Mod I to supervision 2.  Antithrombotics: -DVT/anticoagulation: Lovenox             -antiplatelet therapy: Aspirin 81 mg daily, Plavix 75 mg daily 3. Pain Management: Neurontin 600 mg 3 times daily, Flexeril as needed 4. Mood: Provide emotional support             -antipsychotic agents: N/A 5. Neuropsych: This patient is capable of making decisions on his own behalf. 6. Skin/Wound Care: Routine skin checks 7. Fluids/Electrolytes/Nutrition: Routine in and outs with follow-up chemistries 8.  Diabetes mellitus with peripheral neuropathy.  Hemoglobin A1c 6.6.  SSI. CBG (last 3)  Recent Labs    11/04/19 1702 11/04/19 2134 11/05/19 0620  GLUCAP 108* 141* 105*  Controlled 5/12 9.  Permissive hypertension.  Monitor with increased mobility.  Patient on Cozaar 50 mg daily, Maxide 37.5-25 mg daily prior to admission.  Resume as needed Vitals:   11/05/19 0521 11/05/19 0523  BP: (!) 141/73 137/68  Pulse: 65 73  Resp:    Temp:    SpO2: 98% 98%  Controlled off antihypertensive medications 5/12 10.  Hyperlipidemia.   Lipitor 11. Gout flare- pt has had before- will start Colchicine 0.6 mg BID off Prednisone without recurrence   12.  Hx BPH will restart Flomax and monitor for orthostatic drops  LOS: 8 days A FACE TO FACE EVALUATION WAS PERFORMED  Charlett Blake 11/05/2019, 9:06 AM

## 2019-11-05 NOTE — Progress Notes (Signed)
Physical Therapy Session Note  Patient Details  Name: Jordan Chang MRN: [id number] Date of Birth: 08/12/1940  Today's Date: 11/05/2019 PT Individual Time: 1035-1120 PT Individual Time Calculation (min): 45 min   Short Term Goals: Week 1:  PT Short Term Goal 1 (Week 1): Pt will complete least restrictive transfers with max A consistently PT Short Term Goal 2 (Week 1): Pt will initiate gait training as safe and able PT Short Term Goal 3 (Week 1): Pt will perform w/c mobility x 50 ft with min A  Skilled Therapeutic Interventions/Progress Updates: Pt presented in recliner with wife present agreeable to therapy. Pt denies pain this session. Pt ambulated to elevators with RW and CGA with min cues for increased TKE in stance phase. Pt noted with fatigue to have decreased R foot clearance thus encouraged therapeutic rest. Pt then propelled remaining distance to rehab gym for forced use of RUE. Pt then participated in standing therex at high/low table for BLE strengthening and forced use of RLE. Pt participated in hip abd/add, hamstring curls and hip flexion 2 x 10 bilaterally. Pt required minA for STS from w/c level and required mod cues for use of RUE as pt would tend to lean to R while performing activity. With cues pt was able to push through however unable to maintain for more than 10 sec. Pt transported back to room at end of session and performed ambulatory transfer back to recliner. Pt left with call bell within reach, seat alarm on, and needs met.      Therapy Documentation Precautions:  Precautions Precautions: Fall Restrictions Weight Bearing Restrictions: No General:   Vital Signs:    Therapy/Group: Individual Therapy  Alyjah Lovingood 11/05/2019, 2:25 PM

## 2019-11-05 NOTE — Plan of Care (Signed)
  Problem: RH Problem Solving Goal: LTG Patient will demonstrate problem solving for (SLP) Description: LTG:  Patient will demonstrate problem solving for basic/complex daily situations with cues  (SLP) Flowsheets (Taken 11/05/2019 1704) LTG: Patient will demonstrate problem solving for (SLP): (mild to complex problem solving) Other (comment)

## 2019-11-05 NOTE — Progress Notes (Signed)
Occupational Therapy Weekly Progress Note  Patient Details  Name: Jordan Chang MRN: [id number] Date of Birth: [birth year]  Beginning of progress report period: Oct 29, 2019 End of progress report period: Nov 05, 2019  Today's Date: 11/05/2019 OT Individual Time: 0086-7619 OT Individual Time Calculation (min): 54 min    Patient has met 3 of 3 short term goals.  Pt making progress towards occupational therapy goals this week. Pt reports no pain limiting him in R foot/ankle over the last few days. Pt currently performing short distance ambulating with use of RW and min guard - min A for functional transfers. Pt has been introduced to [place] sponge and reacher to increase independence with LB self care. Pt very pleasant and agreeable to try any activity suggested.His wife has been present to observe several sessions in the room as well. Pt also using R UE in more functional way to wash hair, pull pants up, and drink from cup. Pt continues to make progress and benefits from OT intervention.   Patient continues to demonstrate the following deficits: muscle weakness, decreased cardiorespiratoy endurance, decreased coordination, decreased problem solving and decreased safety awareness and decreased sitting balance, decreased standing balance, hemiplegia and decreased balance strategies and therefore will continue to benefit from skilled OT intervention to enhance overall performance with BADL and iADL.  Patient progressing toward long term goals..  Continue plan of care.  OT Short Term Goals Week 1:  OT Short Term Goal 1 (Week 1): Pt will perform toilet transfer with mod A overall. OT Short Term Goal 1 - Progress (Week 1): Met OT Short Term Goal 2 (Week 1): Pt will perform toileting with mod A overall. OT Short Term Goal 2 - Progress (Week 1): Met OT Short Term Goal 3 (Week 1): Pt will perform LB dressing with mod A overall. OT Short Term Goal 3 - Progress (Week 1): Met Week 2:  OT Short Term Goal 1  (Week 2): STGs=LTGs secondary to upcoming discharge  Skilled Therapeutic Interventions/Progress Updates:    Upon entering the room, pt seated in recliner chair with wife present and transitioning easily from SLP session. Pt requesting to shower and wife has clean clothing out for him. Pt standing with CGA from recliner chair and ambulating 10' with RW and CGA into bathroom. Pt declines toileting this session and sits on TTB for bathing tasks. Pt washing more of self without cuing needed from therapist and continues to incorporate the R UE in functional tasks. Pt able to wash to B ankles but needing to use LH sponge to wash feet. Pt needing assistance this session to wash buttocks as well for thoroughness. Pt standing with min A and OT assisting pt to dry off. He ambulated back to recliner chair for dressing tasks. Pt able to thread clothing onto B feet with min cuing for technique this session. Pt standing with min guard for balance to pull pants over B hips. Pt needing total A to don B non slip socks. Pt remains in recliner chair with call bell and all needed items within reach.  Therapy Documentation Precautions:  Precautions Precautions: Fall Restrictions Weight Bearing Restrictions: No General:   Vital Signs: Therapy Vitals Temp: 97.7 F (36.5 C) Temp Source: Oral Pulse Rate: 88 Resp: 18 BP: (!) 162/74 Patient Position (if appropriate): Standing Oxygen Therapy SpO2: 97 % O2 Device: Room Air   Therapy/Group: Individual Therapy  Gypsy Decant 11/05/2019, 4:17 PM

## 2019-11-05 NOTE — Progress Notes (Signed)
Team Conference Report to Patient/Family  Team Conference discussion was reviewed with the patient and caregiver, including goals, any changes in plan of care and target discharge date.  Patient and caregiver express understanding and are in agreement.  The patient has a target discharge date of 11/13/19. Jordan Chang 11/05/2019, 2:42 PM

## 2019-11-06 ENCOUNTER — Inpatient Hospital Stay (HOSPITAL_COMMUNITY): Payer: PPO | Admitting: Physical Therapy

## 2019-11-06 ENCOUNTER — Inpatient Hospital Stay (HOSPITAL_COMMUNITY): Payer: PPO | Admitting: Occupational Therapy

## 2019-11-06 LAB — GLUCOSE, CAPILLARY
Glucose-Capillary: 109 mg/dL — ABNORMAL HIGH (ref 70–99)
Glucose-Capillary: 82 mg/dL (ref 70–99)
Glucose-Capillary: 93 mg/dL (ref 70–99)
Glucose-Capillary: 88 mg/dL (ref 70–99)

## 2019-11-06 MED ORDER — LIDOCAINE HCL URETHRAL/MUCOSAL 2 % EX GEL
1.0000 "application " | CUTANEOUS | Status: DC | PRN
  Filled 2019-11-06: qty 5

## 2019-11-06 MED ORDER — METHOCARBAMOL 500 MG PO TABS: 500.0000 mg | ORAL_TABLET | Freq: Three times a day (TID) | ORAL | Status: DC | PRN

## 2019-11-06 MED ORDER — LIDOCAINE HCL URETHRAL/MUCOSAL 2 % EX GEL: 1.0000 "application " | Freq: Two times a day (BID) | CUTANEOUS | Status: DC | PRN

## 2019-11-06 NOTE — Progress Notes (Signed)
Physical Therapy Session Note  Patient Details  Name: Jordan Chang MRN: [id number] Date of Birth: [birth year]  Today's Date: 11/06/2019 PT Individual Time: 1020-1120  AND 1300-1359 PT Individual Time Calculation (min): 60 min and 59 min   Short Term Goals: Week 2:  PT Short Term Goal 1 (Week 2): STG=LTG  Skilled Therapeutic Interventions/Progress Updates:   Pt received sitting in WC and agreeable to PT. Pt reports desire to attempt to urinate. Ambulatory transfer to toilet with CGA for safety over threshold. Unable to void. Throughout treatment, pt performed sit<>stand from various surfaces with supervision-min assist due to fatigue at end of session.   WC mobility with BUE x 255f with cues for safety throughout for turns and doorway management.  Gait training with supervision assist and RW x [id number]/reverse gait training with RW and min assist to prevent posterior LOB 3 x [id number] bouts. Cues throughout gait training for improved posture, step length, and pelvic control on the R with LLE to prevent [place].   Stair management training with BUE support to ascend/descend 1 step x 8 with LLE and x 5 with RLE. Min assist for safety and cues for improved hip control to prevent foot drag.   Pt returned to room and performed stand pivot transfer to bed with RW and min assist. Sit>supine completed with supervision assist and cues for improved attention to the RLE and left supine in bed with call bell in reach and all needs met.    Session 2.   Pt received sitting in WC and agreeable to PT.   Gait training with RW to and from day room, supervision-CGA assist overall from PT for safety x 140ft62fditional gait training x 180ft 49f supervision assist in day room with supervision assist and cues for safety in turns and improved posture. No foot drage noted in the RLE this afternoon.   Dynamic balance training: sit<>stand 2 x 5. Pt noted to anchor BLE on table first bout,  instructed to keep space between knees and table for second bout. CGA overall froM For safety and to improved posture and anterior weight   Sit<>supine to mat table with supervision assist for safety, and improved awareness of RLE position. SUpine NMR for BLE and AAROM intermittently for RLE:  Bridges 5 + 6 with 2 sec hold.  SLR 2x10,   Hip abduction  2x11  Heel slide 2x 11 Ankle DF/PF AROM 2 x 20  Cues for full ROM and decreased speed throughout to improve coordination of the RLE and neuromotor control.    Pt returned to room and performed ambulatory transfer to bed with RW and supervision assist. Sit>supine completed with supervision assist as listed above, and left supine in bed with call bell in reach and all needs met.         Therapy Documentation Precautions:  Precautions Precautions: Fall Restrictions Weight Bearing Restrictions: No Pain: denies   Therapy/Group: Individual Therapy  AustinLorie Phenix2021, 11:23 AM

## 2019-11-06 NOTE — Progress Notes (Signed)
Occupational Therapy Session Note  Patient Details  Name: Jordan Chang MRN: [id number] Date of Birth: [birth year]  Today's Date: 11/06/2019 OT Individual Time: 8366-2947 OT Individual Time Calculation (min): 71 min    Short Term Goals: Week 2:  OT Short Term Goal 1 (Week 2): STGs=LTGs secondary to upcoming discharge  Skilled Therapeutic Interventions/Progress Updates:    Upon entering the room, pt seated in recliner chair with no c/o pain but reports he did not have a good night. Pt very upset over having to have I & O cath x2 . OT encouraged pt and he was agreeable to participate in OT intervention. Pt transferred into wheelchair and propelled himself with B UEs for strengthening and coordination with supervision and increased time 75'. Pt then standing and ambulating 95' with RW into day room with S- CGA and min cuing for forward gaze. Pt tasking seated rest break at high low table. Pt standing for 7 minutes and 5 minutes respectively. 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 No drops from hand but decreased dexterity and speed noted. Pt propelled self back towards room secondary to fatigue and transferred back into wheelchair with CGA. Call bell and all needed items within reach upon exiting the room.   Therapy Documentation Precautions:  Precautions Precautions: Fall Restrictions Weight Bearing Restrictions: No   Therapy/Group: Individual Therapy  Alen Bleacher 11/06/2019, 12:17 PM

## 2019-11-06 NOTE — Progress Notes (Signed)
Patient has experienced urinary retention over past 24 hours.  Frequent toileting efforts implemented with Q6H PVR bladder scans ordered.  Last night's bladder scan of 317 mL.  Patient reported urgency with inability to empty bladder.  Shift urine output of 1500 mL.  This morning's PVR bladder scan of 596 mL requiring I/O cath with resultant 1100 mL urine output.

## 2019-11-06 NOTE — Progress Notes (Signed)
[place] PHYSICAL MEDICINE & REHABILITATION PROGRESS NOTE   Subjective/Complaints:  Patient is ambulating in the hallway with physical therapy using a rolling walker.  No pain in the lower extremities.  Complains of requiring ICP  ROS: Patient denies CP, SOB, N/V/D Objective:   No results found. No results for input(s): WBC, HGB, HCT, PLT in the last 72 hours. No results for input(s): NA, K, CL, CO2, GLUCOSE, BUN, CREATININE, CALCIUM in the last 72 hours.  Intake/Output Summary (Last 24 hours) at 11/06/2019 1639 Last data filed at 11/06/2019 1445 Gross per 24 hour  Intake 476 ml  Output 1900 ml  Net -1424 ml     Physical Exam: Vital Signs Blood pressure 130/62, pulse 77, temperature 97.7 F (36.5 C), resp. rate 16, height 5\' 11"  (1.803 m), weight 129.1 kg, SpO2 96 %.  General: No acute distress Mood and affect are appropriate Heart: Regular rate and rhythm no rubs murmurs or extra sounds Lungs: Clear to auscultation, breathing unlabored, no rales or wheezes Abdomen: Positive bowel sounds, soft nontender to palpation, nondistended Extremities: No clubbing, cyanosis, or edema Skin: No evidence of breakdown, no evidence of rash  Neurologic: Cranial nerves II through XII intact, motor strength is 5/5 in left and 4- to 4/5 Right  deltoid, bicep, tricep, grip, hip flexor, knee extensors, ankle dorsiflexor and plantar flexor sensation intact LT BUE and BLE Musculoskeletal:full ROM, no pain    Assessment/Plan: 1. Functional deficits secondary to bilateral CVA which require 3+ hours per day of interdisciplinary therapy in a comprehensive inpatient rehab setting.  Physiatrist is providing close team supervision and 24 hour management of active medical problems listed below.  Physiatrist and rehab team continue to assess barriers to discharge/monitor patient progress toward functional and medical goals  Care Tool:  Bathing  Bathing activity did not occur: Safety/medical  concerns Body parts bathed by patient: Right arm, Chest, Abdomen, Right upper leg, Left upper leg, Face, Front perineal area, Left arm, Right lower leg, Left lower leg, Buttocks   Body parts bathed by helper: Right lower leg, Left lower leg     Bathing assist Assist Level: Contact Guard/Touching assist     Upper Body Dressing/Undressing Upper body dressing Upper body dressing/undressing activity did not occur (including orthotics): Refused What is the patient wearing?: Pull over shirt    Upper body assist Assist Level: Set up assist    Lower Body Dressing/Undressing Lower body dressing      What is the patient wearing?: Underwear/pull up, Pants     Lower body assist Assist for lower body dressing: Contact Guard/Touching assist     Toileting Toileting    Toileting assist Assist for toileting: Minimal Assistance - Patient > 75% Assistive Device Comment: Urinal   Transfers Chair/bed transfer  Transfers assist  Chair/bed transfer activity did not occur: Safety/medical concerns  Chair/bed transfer assist level: Contact Guard/Touching assist     Locomotion Ambulation   Ambulation assist   Ambulation activity did not occur: Safety/medical concerns  Assist level: Contact Guard/Touching assist Assistive device: Walker-rolling Max distance: 93'   Walk 10 feet activity   Assist  Walk 10 feet activity did not occur: Safety/medical concerns  Assist level: Contact Guard/Touching assist Assistive device: Walker-rolling   Walk 50 feet activity   Assist Walk 50 feet with 2 turns activity did not occur: Safety/medical concerns  Assist level: Contact Guard/Touching assist Assistive device: Walker-rolling    Walk 150 feet activity   Assist Walk 150 feet activity did not occur: Safety/medical  concerns  Assist level: Contact Guard/Touching assist Assistive device: Walker-rolling    Walk 10 feet on uneven surface  activity   Assist Walk 10 feet on uneven  surfaces activity did not occur: Safety/medical concerns         Wheelchair     Assist Will patient use wheelchair at discharge?: Yes Type of Wheelchair: Manual    Wheelchair assist level: Minimal Assistance - Patient > 75% Max wheelchair distance: 21'    Wheelchair 50 feet with 2 turns activity    Assist        Assist Level: Moderate Assistance - Patient 50 - 74%   Wheelchair 150 feet activity     Assist      Assist Level: Total Assistance - Patient < 25%   Blood pressure 130/62, pulse 77, temperature 97.7 F (36.5 C), resp. rate 16, height 5\' 11"  (1.803 m), weight 129.1 kg, SpO2 96 %.  Medical Problem List and Plan: 1.  Right-sided weakness with dysarthria secondary to acute to subacute infarct multiple vascular territories involving bilateral brain parenchyma(left pons, inferior right cerebellum, left frontal lobe and right parietal lobe).  Small chronic right cerebellar infarct             -patient may  shower             -ELOS/Goals: 2- 2.5 weeks- goals Mod I to supervision 2.  Antithrombotics: -DVT/anticoagulation: Lovenox             -antiplatelet therapy: Aspirin 81 mg daily, Plavix 75 mg daily 3. Pain Management: Neurontin 600 mg 3 times daily,  Flexeril discontinued secondary to urinary retention switch to methocarbamol 4. Mood: Provide emotional support             -antipsychotic agents: N/A 5. Neuropsych: This patient is capable of making decisions on his own behalf. 6. Skin/Wound Care: Routine skin checks 7. Fluids/Electrolytes/Nutrition: Routine in and outs with follow-up chemistries 8.  Diabetes mellitus with peripheral neuropathy.  Hemoglobin A1c 6.6.  SSI. CBG (last 3)  Recent Labs    11/05/19 2102 11/06/19 0559 11/06/19 1147  GLUCAP 78 109* 82  Controlled 5/13 9.  Permissive hypertension.  Monitor with increased mobility.  Patient on Cozaar 50 mg daily, Maxide 37.5-25 mg daily prior to admission.  Resume as needed Vitals:    11/06/19 0300 11/06/19 1446  BP: 129/75 130/62  Pulse: 80 77  Resp:  16  Temp: 97.8 F (36.6 C) 97.7 F (36.5 C)  SpO2: 99% 96%  Controlled off antihypertensive medications 5/13, monitor  10.  Hyperlipidemia.  Lipitor 11. Gout flare- pt has had before- will start Colchicine 0.6 mg BID off Prednisone without recurrence   12.  Hx BPH will restart Flomax and monitor for orthostatic drops , + urinary retention , required ICP but has been taking Flexeril on a regular basis.  Will discontinue Flexeril as this may have some anticholinergic effects on the bladder LOS: 9 days A FACE TO FACE EVALUATION WAS PERFORMED  6/13 11/06/2019, 4:39 PM

## 2019-11-07 ENCOUNTER — Inpatient Hospital Stay (HOSPITAL_COMMUNITY): Payer: PPO | Admitting: Occupational Therapy

## 2019-11-07 ENCOUNTER — Inpatient Hospital Stay (HOSPITAL_COMMUNITY): Payer: PPO | Admitting: Physical Therapy

## 2019-11-07 ENCOUNTER — Inpatient Hospital Stay (HOSPITAL_COMMUNITY): Payer: PPO | Admitting: Speech Pathology

## 2019-11-07 LAB — GLUCOSE, CAPILLARY
Glucose-Capillary: 94 mg/dL (ref 70–99)
Glucose-Capillary: 108 mg/dL — ABNORMAL HIGH (ref 70–99)
Glucose-Capillary: 92 mg/dL (ref 70–99)
Glucose-Capillary: 107 mg/dL — ABNORMAL HIGH (ref 70–99)

## 2019-11-07 NOTE — Progress Notes (Signed)
[place] PHYSICAL MEDICINE & REHABILITATION PROGRESS NOTE   Subjective/Complaints: C/o bruising abd from heparin  Also passed clot and has voided freely since that time  No issues overnite    ROS: Patient denies CP, SOB, N/V/D Objective:   No results found. No results for input(s): WBC, HGB, HCT, PLT in the last 72 hours. No results for input(s): NA, K, CL, CO2, GLUCOSE, BUN, CREATININE, CALCIUM in the last 72 hours.  Intake/Output Summary (Last 24 hours) at 11/07/2019 0802 Last data filed at 11/07/2019 0350 Gross per 24 hour  Intake 892 ml  Output 775 ml  Net 117 ml     Physical Exam: Vital Signs Blood pressure 137/65, pulse (!) 58, temperature 97.8 F (36.6 C), resp. rate 19, height 5\' 11"  (1.803 m), weight 129.1 kg, SpO2 100 %.   General: No acute distress Mood and affect are appropriate Heart: Regular rate and rhythm no rubs murmurs or extra sounds Lungs: Clear to auscultation, breathing unlabored, no rales or wheezes Abdomen: Positive bowel sounds, soft nontender to palpation, nondistended Extremities: No clubbing, cyanosis, or edema Skin: No evidence of breakdown, no evidence of rash 5-10cm diameter ecchymosis on abd x 5-6  Neurologic: Cranial nerves II through XII intact, motor strength is 5/5 in left and 4- to 4/5 Right  deltoid, bicep, tricep, grip, hip flexor, knee extensors, ankle dorsiflexor and plantar flexor sensation intact LT BUE and BLE Musculoskeletal:full ROM, no pain    Assessment/Plan: 1. Functional deficits secondary to bilateral CVA which require 3+ hours per day of interdisciplinary therapy in a comprehensive inpatient rehab setting.  Physiatrist is providing close team supervision and 24 hour management of active medical problems listed below.  Physiatrist and rehab team continue to assess barriers to discharge/monitor patient progress toward functional and medical goals  Care Tool:  Bathing  Bathing activity did not occur:  Safety/medical concerns Body parts bathed by patient: Right arm, Chest, Abdomen, Right upper leg, Left upper leg, Face, Front perineal area, Left arm, Right lower leg, Left lower leg, Buttocks   Body parts bathed by helper: Right lower leg, Left lower leg     Bathing assist Assist Level: Contact Guard/Touching assist     Upper Body Dressing/Undressing Upper body dressing Upper body dressing/undressing activity did not occur (including orthotics): Refused What is the patient wearing?: Pull over shirt    Upper body assist Assist Level: Set up assist    Lower Body Dressing/Undressing Lower body dressing      What is the patient wearing?: Underwear/pull up, Pants     Lower body assist Assist for lower body dressing: Contact Guard/Touching assist     Toileting Toileting    Toileting assist Assist for toileting: Contact Guard/Touching assist Assistive Device Comment: Urinal   Transfers Chair/bed transfer  Transfers assist  Chair/bed transfer activity did not occur: Safety/medical concerns  Chair/bed transfer assist level: Contact Guard/Touching assist     Locomotion Ambulation   Ambulation assist   Ambulation activity did not occur: Safety/medical concerns  Assist level: Contact Guard/Touching assist Assistive device: Walker-rolling Max distance: 93'   Walk 10 feet activity   Assist  Walk 10 feet activity did not occur: Safety/medical concerns  Assist level: Contact Guard/Touching assist Assistive device: Walker-rolling   Walk 50 feet activity   Assist Walk 50 feet with 2 turns activity did not occur: Safety/medical concerns  Assist level: Contact Guard/Touching assist Assistive device: Walker-rolling    Walk 150 feet activity   Assist Walk 150 feet activity did not  occur: Safety/medical concerns  Assist level: Contact Guard/Touching assist Assistive device: Walker-rolling    Walk 10 feet on uneven surface  activity   Assist Walk 10 feet on  uneven surfaces activity did not occur: Safety/medical concerns         Wheelchair     Assist Will patient use wheelchair at discharge?: Yes Type of Wheelchair: Manual    Wheelchair assist level: Minimal Assistance - Patient > 75% Max wheelchair distance: 31'    Wheelchair 50 feet with 2 turns activity    Assist        Assist Level: Moderate Assistance - Patient 50 - 74%   Wheelchair 150 feet activity     Assist      Assist Level: Total Assistance - Patient < 25%   Blood pressure 137/65, pulse (!) 58, temperature 97.8 F (36.6 C), resp. rate 19, height 5\' 11"  (1.803 m), weight 129.1 kg, SpO2 100 %.  Medical Problem List and Plan: 1.  Right-sided weakness with dysarthria secondary to acute to subacute infarct multiple vascular territories involving bilateral brain parenchyma(left pons, inferior right cerebellum, left frontal lobe and right parietal lobe).  Small chronic right cerebellar infarct             -patient may  shower             -ELOS/Goals: tent D/C 5/20 goals Mod I to supervision 2.  Antithrombotics: -DVT/anticoagulation: Lovenox amb >300' may d/c             -antiplatelet therapy: Aspirin 81 mg daily, Plavix 75 mg daily 3. Pain Management: Neurontin 600 mg 3 times daily,  Flexeril discontinued secondary to urinary retention switch to methocarbamol 4. Mood: Provide emotional support             -antipsychotic agents: N/A 5. Neuropsych: This patient is capable of making decisions on his own behalf. 6. Skin/Wound Care: Routine skin checks 7. Fluids/Electrolytes/Nutrition: Routine in and outs with follow-up chemistries 8.  Diabetes mellitus with peripheral neuropathy.  Hemoglobin A1c 6.6.  SSI. CBG (last 3)  Recent Labs    11/06/19 1708 11/06/19 2137 11/07/19 0707  GLUCAP 93 88 94  Controlled 5/14 9.  Permissive hypertension.  Monitor with increased mobility.  Patient on Cozaar 50 mg daily, Maxide 37.5-25 mg daily prior to admission.  Resume  as needed Vitals:   11/07/19 0621 11/07/19 0627  BP: (!) 148/82 137/65  Pulse: 72 (!) 58  Resp:    Temp:    SpO2: 100% 100%  Controlled off antihypertensive medications 5/14, monitor  10.  Hyperlipidemia.  Lipitor 11. Gout flare- pt has had before- will start Colchicine 0.6 mg BID off Prednisone without recurrence   12.  Hx BPH will restart Flomax and monitor for orthostatic drops , + urinary retention , required ICP but has been taking Flexeril on a regular basis.  Will discontinue Flexeril as this may have some anticholinergic effects on the bladder  Pt states he passed a clot with urination and since then has been able to void LOS: 10 days A FACE TO [place] E Jamol Ginyard 11/07/2019, 8:02 AM

## 2019-11-07 NOTE — Plan of Care (Signed)
Nutrition Education Note  RD consulted for nutrition education regarding DASH Diet.  Spoke with pt and wife at bedside. Per pt wife, pt "has an appetite of a teenager" and eats most foods. Both are concerned about pt's stroke and the fact that they have gained weight secondary to quarantining during COVID-19. Both desire to eat more healthfully after discharge and would like more guidance on how to achieve this goal.   RD provided "General, Healthful Nutrition Therapy" handout from the [employer]. Reviewed patient's dietary recall. Provided examples on ways to decrease sodium intake in diet. Discouraged intake of processed foods and use of salt shaker. Encouraged fresh fruits and vegetables as well as whole grain sources of carbohydrates to maximize fiber intake.  Teach back method used.  Expect fair to good compliance.  Body mass index is 39.7 kg/m. Pt meets criteria for obesity, class II based on current BMI.  Current diet order is heart healthy/ carb modified, patient is consuming approximately 100% of meals at this time. Labs and medications reviewed. No further nutrition interventions warranted at this time. RD contact information provided. If additional nutrition issues arise, please re-consult RD.   Levada Schilling, RD, LDN, CDCES Registered Dietitian II Certified Diabetes Care and Education Specialist Please refer to [facility] for RD and/or RD on-call/weekend/after hours pager

## 2019-11-07 NOTE — Progress Notes (Signed)
Speech Language Pathology Daily Session Note  Patient Details  Name: ALAN DRUMMER MRN: [id number] Date of Birth: 09-03-1940  Today's Date: 11/07/2019 SLP Individual Time: 0935-1020 SLP Individual Time Calculation (min): 45 min  Short Term Goals: Week 2: SLP Short Term Goal 1 (Week 2): STG=LTG due to short ELOS  Skilled Therapeutic Interventions:  Pt was seen for skilled ST targeting cognitive goals.  SLP facilitated the session with a medication management task, using a three times daily pill organizer to address goals for problem solving and memory.  Pt was able to recall function and frequency of each of his medications with supervision question cues.  Pt then loaded pills into pill box for 100% accuracy with supervision instructional cues.  Pt requested to use the bathroom as therapist was leaving and used the call bell appropriately to ask for assistance.  Pt was left with nurse tech present who assisted pt to the bathroom.  Continue per current plan of care.    Pain Pain Assessment Pain Scale: 0-10 Pain Score: 0-No pain  Therapy/Group: Individual Therapy  Marci Polito, Melanee Spry 11/07/2019, 12:31 PM

## 2019-11-07 NOTE — Progress Notes (Signed)
Occupational Therapy Session Note  Patient Details  Name: Jordan Chang MRN: [id number] Date of Birth: 1940/10/18  Today's Date: 11/07/2019 OT Individual Time: 0703-0800 and 1300-1330 OT Individual Time Calculation (min): 57 min and 30 mins   Short Term Goals: Week 2:  OT Short Term Goal 1 (Week 2): STGs=LTGs secondary to upcoming discharge  Skilled Therapeutic Interventions/Progress Updates:    Session 1: Upon entering the room, pt in bathroom and transferring onto toilet with RN present in room. Pt with no c/o pain this session. Pt able to void and doffs clothing from seated position on commode. Pt takes several steps without RW and min A into shower and onto TTB. Pt bathing with min guard for safety. He is able to washing B feet this session and leans forward to wash buttocks as needed. Pt exiting the bathroom with RW and CGA to recliner chair for dressing tasks. Set up A for UB dressing this session. Pt able to threading clothing onto B feet and over hips with reminds for hemiplegic technique. Pt making multiple attempts to don socks but unable due to body habitus. Pt reports he does not wear socks at home and only puts shoes on feet. OT provides total A to don B socks. Breakfast tray placed in front of pt and he demonstrated ability to cut food with BUEs, feed self several bites with R UE, and opened milk cart with min cuing for technique. Pt continues to eat as therapist exits the room. Call bell and all needed items within reach upon exiting the room.   Session 2: Upon entering the room, pt seated in recliner chair with wife present in the room. Pt with no c/o pain. Pt declined toileting during this session. Pt given 9 hole peg test with L hand average being 27.91 seconds and R hand average being 80.55 seconds. Pt then given several circle items for palmar translation task with multiple drops from hand when pt holding more than 3 items. Pt attempting to stack as well with multiple drops  occurring. These were left in patients room for him to continue working on over the weekend. Pt remained in recliner chair with call bell and all needs within reach.   Therapy Documentation Precautions:  Precautions Precautions: Fall Restrictions Weight Bearing Restrictions: No General:   Vital Signs: Therapy Vitals Pulse Rate: (!) 58 BP: 137/65 Patient Position (if appropriate): Sitting Oxygen Therapy SpO2: 100 % O2 Device: Room Air   Therapy/Group: Individual Therapy  Alen Bleacher 11/07/2019, 10:01 AM

## 2019-11-07 NOTE — Progress Notes (Signed)
Physical Therapy Session Note  Patient Details  Name: Jordan Chang MRN: [id number] Date of Birth: 01-03-1941  Today's Date: 11/07/2019 PT Individual Time: 1415-1515 PT Individual Time Calculation (min): 60 min   Short Term Goals: Week 2:  PT Short Term Goal 1 (Week 2): STG=LTG  Skilled Therapeutic Interventions/Progress Updates:   Pt received sitting in WC and agreeable to PT. Stand pivot transfer to [facility] with RW and supervision assist. Pt transported to entrance of [place]. Gait training over unlevel cement side walk with RW and CGA x 21f and [id number]. Min cues for improved step height on the R and safety in turns. Additional dynamic gait training through hospital gift shop with RW and supervision assist and only min cues for step hight intermittently. Pt performed WC mobility training to force use of RUE x 12042fith min cues for symmetry throughout. Stair management training to ascend/descend 4 steps with min assist overall and moderate cues for step to gait pattern and improved awareness of RUE/RLE position to reduce fall risk posteriorly. Standing balance training to performed reciprocal foot taps over 1 inch obstacle with cues for posture and improved hip flexion with RLE step back. Sit<>stand throughout treatment with min-supervision assist with cues for UE placement and anterior weight shift with each transfer.  Pt reports need to urinate. Ambulatory transfer to toilet with supervision assist and RW, pt able to manage pants and void with supervision assist and UE support on rail. Patient returned to room and performed  Ambulatory transfer to recliner with RW and supervision assist. Pt left sitting in recliner with call bell in reach and all needs met.         Therapy Documentation Precautions:  Precautions Precautions: Fall Restrictions Weight Bearing Restrictions: No    Vital Signs: Therapy Vitals Temp: 97.7 F (36.5 C) Temp Source: Oral Pulse Rate: 70 Resp: 19 BP: (!)  157/67 Patient Position (if appropriate): Sitting Oxygen Therapy SpO2: 98 % O2 Device: Room Air Pain: Pain Assessment Pain Scale: 0-10 Pain Score: 0-No pain    Therapy/Group: Individual Therapy  AusLorie Phenix14/2021, 3:34 PM

## 2019-11-08 ENCOUNTER — Inpatient Hospital Stay (HOSPITAL_COMMUNITY): Payer: PPO | Admitting: Physical Therapy

## 2019-11-08 LAB — GLUCOSE, CAPILLARY
Glucose-Capillary: 97 mg/dL (ref 70–99)
Glucose-Capillary: 158 mg/dL — ABNORMAL HIGH (ref 70–99)
Glucose-Capillary: 82 mg/dL (ref 70–99)
Glucose-Capillary: 118 mg/dL — ABNORMAL HIGH (ref 70–99)

## 2019-11-08 MED ORDER — FUROSEMIDE 20 MG PO TABS
20.0000 mg | ORAL_TABLET | Freq: Every day | ORAL | Status: DC
  Administered 2019-11-08 – 2019-11-13 (×6): 20 mg via ORAL
  Filled 2019-11-08 (×6): qty 1

## 2019-11-08 NOTE — Progress Notes (Signed)
Physical Therapy Session Note  Patient Details  Name: Jordan Chang MRN: [id number] Date of Birth: 02/28/1941  Today's Date: 11/08/2019 PT Individual Time: 0802-0900 AND 1420-1500 PT Individual Time Calculation (min): 58 min and 40 min   Short Term Goals: Week 2:  PT Short Term Goal 1 (Week 2): STG=LTG  Skilled Therapeutic Interventions/Progress Updates:  Session 1  Pt received sitting in WC and agreeable to PT. Gait training instructed by PT x 177f, 159f 6092f2 with RW and supervision assist. Min cues for step height on the R when fatigued, but able to clear and advance RLE 90% of gait without cues.   Stair management training with 1 Rail R to simulate home environment 2 x 3 steps with min assist overall. Cues for UE placement and proper step to gait pattern, ascending with LLE and descending with RLE. Pt descending steps backward to maintain BUE support on 1 rail and safe step-to pattern.   Dynamic balance/fine motor NMR to perform cross body reach then lateral reach to place 12 clothes pins on basketball net while standing on red wedge. Completed Bil with min assist initially to prevent posterior LOB then progressed to supervision assist to maintain balance, min assist also provided to the RUE to improve coordination with lateral reach at end range.   Nustep reciprocal endurance training, level 6, x 11 minutes with cues for full ROM and symmetry intermittently. Pt rated Borg RPE 14-15/20 at end of cardiovascular training.   Patient returned to room and performed stand pivot to recliner with CGA and RW due to fatigue. Pt left sitting in recliner with call bell in reach and all needs met.    Session 2.  Pt received sitting in WC and agreeable to PT. Wife present for education throughout  Treatment. Gait training with RW x [id number] supervision assist throughout. Noted to have mild foot drag last 50ft53ft able to correct with cues. Cues given to wife for proper guarding technique to  prevent R/posterior LOB. Stand pivot transfer from various surfaces with supervision assist 5, min cues for anterior weight shift and education for facilitation by family if needed at home.   Stair management training as listed above with min assist to ascend/descend 4 steps with 1 rail on the R. Cues for step height and step to pattern to reduce fall risk. Pt also encouraged to build ramp as soon as possible for safety.   Car transfer training with min assist to improved anterior weight shift to exit car with cues not to pull on unstable supports such as WC or car door. Wife, states this was how pt accessed car prior to CVA.   Patient returned to room and performed stand pivot to recliner with RW and supervision assist after 2 tries to stand, cueing required for nose over toes and adequate anterior weight shift. Pt left sitting in recliner with call bell in reach and all needs met.          Therapy Documentation Precautions:  Precautions Precautions: Fall Restrictions Weight Bearing Restrictions: No    Pain: Pain Assessment Pain Scale: 0-10 Pain Score: 0-No pain    Therapy/Group: Individual Therapy  AustiLorie Phenix/2021, 9:01 AM

## 2019-11-08 NOTE — Progress Notes (Signed)
[place] PHYSICAL MEDICINE & REHABILITATION PROGRESS NOTE   Subjective/Complaints: Voiding freely. Wife says he was on furosemide at home and e is concerned about the severity of his edema.  BP slightly elevated.   ROS: Patient denies CP, SOB, N/V/D Objective:   No results found. No results for input(s): WBC, HGB, HCT, PLT in the last 72 hours. No results for input(s): NA, K, CL, CO2, GLUCOSE, BUN, CREATININE, CALCIUM in the last 72 hours.  Intake/Output Summary (Last 24 hours) at 11/08/2019 1535 Last data filed at 11/08/2019 1330 Gross per 24 hour  Intake 842 ml  Output 975 ml  Net -133 ml     Physical Exam: Vital Signs Blood pressure (!) 146/64, pulse 65, temperature 98.3 F (36.8 C), temperature source Oral, resp. rate 18, height 5\' 11"  (1.803 m), weight 129.1 kg, SpO2 97 %. General: No acute distress Mood and affect are appropriate Heart: Regular rate and rhythm no rubs murmurs or extra sounds Lungs: Clear to auscultation, breathing unlabored, no rales or wheezes Abdomen: Positive bowel sounds, soft nontender to palpation, nondistended Extremities: No clubbing, cyanosis. 2+ edema bilaterally.  Skin: No evidence of breakdown, no evidence of rash 5-10cm diameter ecchymosis on abd x 5-6 Neurologic: Cranial nerves II through XII intact, motor strength is 5/5 in left and 4- to 4/5 Right  deltoid, bicep, tricep, grip, hip flexor, knee extensors, ankle dorsiflexor and plantar flexor sensation intact LT BUE and BLE Musculoskeletal:full ROM, no pain  Assessment/Plan: 1. Functional deficits secondary to bilateral CVA which require 3+ hours per day of interdisciplinary therapy in a comprehensive inpatient rehab setting.  Physiatrist is providing close team supervision and 24 hour management of active medical problems listed below.  Physiatrist and rehab team continue to assess barriers to discharge/monitor patient progress toward functional and medical goals  Care  Tool:  Bathing  Bathing activity did not occur: Safety/medical concerns Body parts bathed by patient: Right arm, Chest, Abdomen, Right upper leg, Left upper leg, Face, Front perineal area, Left arm, Right lower leg, Left lower leg, Buttocks   Body parts bathed by helper: Right lower leg, Left lower leg     Bathing assist Assist Level: Contact Guard/Touching assist     Upper Body Dressing/Undressing Upper body dressing Upper body dressing/undressing activity did not occur (including orthotics): Refused What is the patient wearing?: Pull over shirt    Upper body assist Assist Level: Set up assist    Lower Body Dressing/Undressing Lower body dressing      What is the patient wearing?: Underwear/pull up, Pants     Lower body assist Assist for lower body dressing: Contact Guard/Touching assist     Toileting Toileting    Toileting assist Assist for toileting: Contact Guard/Touching assist Assistive Device Comment: Urinal   Transfers Chair/bed transfer  Transfers assist  Chair/bed transfer activity did not occur: Safety/medical concerns  Chair/bed transfer assist level: Supervision/Verbal cueing     Locomotion Ambulation   Ambulation assist   Ambulation activity did not occur: Safety/medical concerns  Assist level: Supervision/Verbal cueing Assistive device: Walker-rolling Max distance: 180   Walk 10 feet activity   Assist  Walk 10 feet activity did not occur: Safety/medical concerns  Assist level: Supervision/Verbal cueing Assistive device: Walker-rolling   Walk 50 feet activity   Assist Walk 50 feet with 2 turns activity did not occur: Safety/medical concerns  Assist level: Supervision/Verbal cueing Assistive device: Walker-rolling    Walk 150 feet activity   Assist Walk 150 feet activity did not occur:  Safety/medical concerns  Assist level: Supervision/Verbal cueing Assistive device: Walker-rolling    Walk 10 feet on uneven surface   activity   Assist Walk 10 feet on uneven surfaces activity did not occur: Safety/medical concerns         Wheelchair     Assist Will patient use wheelchair at discharge?: Yes Type of Wheelchair: Manual    Wheelchair assist level: Supervision/Verbal cueing Max wheelchair distance: 100    Wheelchair 50 feet with 2 turns activity    Assist        Assist Level: Supervision/Verbal cueing   Wheelchair 150 feet activity     Assist      Assist Level: Minimal Assistance - Patient > 75%   Blood pressure (!) 146/64, pulse 65, temperature 98.3 F (36.8 C), temperature source Oral, resp. rate 18, height 5\' 11"  (1.803 m), weight 129.1 kg, SpO2 97 %.  Medical Problem List and Plan: 1.  Right-sided weakness with dysarthria secondary to acute to subacute infarct multiple vascular territories involving bilateral brain parenchyma(left pons, inferior right cerebellum, left frontal lobe and right parietal lobe).  Small chronic right cerebellar infarct             -patient may  shower             -ELOS/Goals: tent D/C 5/20 goals Mod I to supervision  -Continue CIR 2.  Antithrombotics: -DVT/anticoagulation: Lovenox amb >300' may d/c             -antiplatelet therapy: Aspirin 81 mg daily, Plavix 75 mg daily 3. Pain Management: Neurontin 600 mg 3 times daily,  Flexeril discontinued secondary to urinary retention switch to methocarbamol 4. Mood: Provide emotional support             -antipsychotic agents: N/A 5. Neuropsych: This patient is capable of making decisions on his own behalf. 6. Skin/Wound Care: Routine skin checks 7. Fluids/Electrolytes/Nutrition: Routine in and outs with follow-up chemistries 8.  Diabetes mellitus with peripheral neuropathy.  Hemoglobin A1c 6.6.  SSI. CBG (last 3)  Recent Labs    11/07/19 2108 11/08/19 0612 11/08/19 1138  GLUCAP 107* 97 158*  Controlled 5/15 at 158.  9.  Permissive hypertension.  Monitor with increased mobility.  Patient on  Cozaar 50 mg daily, Maxide 37.5-25 mg daily prior to admission.  Resume as needed Vitals:   11/08/19 0308 11/08/19 1347  BP: 130/61 (!) 146/64  Pulse: 83 65  Resp:  18  Temp:  98.3 F (36.8 C)  SpO2:  97%  Controlled off antihypertensive medications 5/15, monitor 10.  Hyperlipidemia.  Lipitor 11. Gout flare- pt has had before- will start Colchicine 0.6 mg BID off Prednisone without recurrence   12.  Hx BPH will restart Flomax and monitor for orthostatic drops , + urinary retention , required ICP but has been taking Flexeril on a regular basis.  Will discontinue Flexeril as this may have some anticholinergic effects on the bladder 13. Lower extremity edema: Restart Lasix 20mg  daily. Recheck Cr tomorrow.  14. Disposition: Given LE edema and risk factors, he should follow with Dr. Clayborn Bigness whom his wife sees.   Pt states he passed a clot with urination and since then has been able to void LOS: 11 days A FACE TO FACE EVALUATION WAS PERFORMED  Jordan Chang Jordan Chang 11/08/2019, 3:35 PM

## 2019-11-08 NOTE — Plan of Care (Signed)
  Problem: RH Bed to Chair Transfers Goal: LTG Patient will perform bed/chair transfers w/assist (PT) Description: LTG: Patient will perform bed to chair transfers with assistance (PT). Flowsheets (Taken 11/08/2019 0745) LTG: Pt will perform Bed to Chair Transfers with assistance level: (Upgraded due to progress) Contact Guard/Touching assist Note: Upgraded due to progress   Problem: RH Car Transfers Goal: LTG Patient will perform car transfers with assist (PT) Description: LTG: Patient will perform car transfers with assistance (PT). Flowsheets (Taken 11/08/2019 0745) LTG: Pt will perform car transfers with assist:: Contact Guard/Touching assist   Problem: RH Ambulation Goal: LTG Patient will ambulate in controlled environment (PT) Description: LTG: Patient will ambulate in a controlled environment, # of feet with assistance (PT). Flowsheets (Taken 11/08/2019 0745) LTG: Pt will ambulate in controlled environ  assist needed:: (Upgraded due to progress) Supervision/Verbal cueing LTG: Ambulation distance in controlled environment: 165ft Note: Upgraded due to progress Goal: LTG Patient will ambulate in home environment (PT) Description: LTG: Patient will ambulate in home environment, # of feet with assistance (PT). Flowsheets (Taken 11/08/2019 0745) LTG: Pt will ambulate in home environ  assist needed:: (Upgraded due to progress) Supervision/Verbal cueing LTG: Ambulation distance in home environment: 29ft with LRAD Note: Upgraded due to progress   Problem: RH Stairs Goal: LTG Patient will ambulate up and down stairs w/assist (PT) Description: LTG: Patient will ambulate up and down # of stairs with assistance (PT) Flowsheets Taken 11/08/2019 0745 by Golden Pop, PT LTG: Pt will ambulate up/down stairs assist needed:: (Upgraded due to progress) Minimal Assistance - Patient > 75% Taken 10/29/2019 1620 by Peter Congo, PT LTG: Pt will  ambulate up and down number of stairs: 3 stairs with  one handrail Note: Upgraded due to progress

## 2019-11-08 NOTE — Progress Notes (Signed)
Pt refused 1200 novolog dose d/t feeling hypoglycemic prior to CBG taken and drank a coke and graham crackers approx 1045. Pt stable and feeling better now. Will cont to monitor.   Ross Ludwig, RN

## 2019-11-09 ENCOUNTER — Inpatient Hospital Stay (HOSPITAL_COMMUNITY): Payer: PPO

## 2019-11-09 LAB — BASIC METABOLIC PANEL
Anion gap: 8 (ref 5–15)
BUN: 21 mg/dL (ref 8–23)
CO2: 26 mmol/L (ref 22–32)
Calcium: 9.6 mg/dL (ref 8.9–10.3)
Chloride: 105 mmol/L (ref 98–111)
Creatinine, Ser: 1.15 mg/dL (ref 0.61–1.24)
GFR calc Af Amer: >60 mL/min (ref >60)
GFR calc non Af Amer: 60 mL/min — ABNORMAL LOW (ref >60)
Glucose, Bld: 118 mg/dL — ABNORMAL HIGH (ref 70–99)
Potassium: 4.5 mmol/L (ref 3.5–5.1)
Sodium: 139 mmol/L (ref 135–145)

## 2019-11-09 LAB — GLUCOSE, CAPILLARY
Glucose-Capillary: 104 mg/dL — ABNORMAL HIGH (ref 70–99)
Glucose-Capillary: 83 mg/dL (ref 70–99)
Glucose-Capillary: 147 mg/dL — ABNORMAL HIGH (ref 70–99)
Glucose-Capillary: 81 mg/dL (ref 70–99)

## 2019-11-09 NOTE — Progress Notes (Signed)
[place] PHYSICAL MEDICINE & REHABILITATION PROGRESS NOTE   Subjective/Complaints: Right 2nd digit without pain Edema improved with Lasix Cr stable this morning, as are other labs.  Had BM this morning  ROS: Patient denies CP, SOB, N/V/D Objective:   No results found. No results for input(s): WBC, HGB, HCT, PLT in the last 72 hours. Recent Labs    11/09/19 0713  NA 139  K 4.5  CL 105  CO2 26  GLUCOSE 118*  BUN 21  CREATININE 1.15  CALCIUM 9.6    Intake/Output Summary (Last 24 hours) at 11/09/2019 0955 Last data filed at 11/09/2019 0734 Gross per 24 hour  Intake 778 ml  Output 475 ml  Net 303 ml     Physical Exam: Vital Signs Blood pressure 133/68, pulse 70, temperature 98.4 F (36.9 C), temperature source Oral, resp. rate 18, height 5\' 11"  (1.803 m), weight 129.1 kg, SpO2 99 %. General: No acute distress Mood and affect are appropriate Heart: Regular rate and rhythm no rubs murmurs or extra sounds Lungs: Clear to auscultation, breathing unlabored, no rales or wheezes Abdomen: Positive bowel sounds, soft nontender to palpation, nondistended Extremities: No clubbing, cyanosis. 2+ edema bilaterally.  Skin: No evidence of breakdown, no evidence of rash 5-10cm diameter ecchymosis on abd x 5-6 Right 2d digit red with mild swelling. Nontender Neurologic: Cranial nerves II through XII intact, motor strength is 5/5 in left and 4- to 4/5 Right  deltoid, bicep, tricep, grip, hip flexor, knee extensors, ankle dorsiflexor and plantar flexor sensation intact LT BUE and BLE Musculoskeletal:full ROM, no pain  Assessment/Plan: 1. Functional deficits secondary to bilateral CVA which require 3+ hours per day of interdisciplinary therapy in a comprehensive inpatient rehab setting.  Physiatrist is providing close team supervision and 24 hour management of active medical problems listed below.  Physiatrist and rehab team continue to assess barriers to discharge/monitor patient  progress toward functional and medical goals  Care Tool:  Bathing  Bathing activity did not occur: Safety/medical concerns Body parts bathed by patient: Right arm, Chest, Abdomen, Right upper leg, Left upper leg, Face, Front perineal area, Left arm, Right lower leg, Left lower leg, Buttocks   Body parts bathed by helper: Right lower leg, Left lower leg     Bathing assist Assist Level: Contact Guard/Touching assist     Upper Body Dressing/Undressing Upper body dressing Upper body dressing/undressing activity did not occur (including orthotics): Refused What is the patient wearing?: Pull over shirt    Upper body assist Assist Level: Set up assist    Lower Body Dressing/Undressing Lower body dressing      What is the patient wearing?: Underwear/pull up, Pants     Lower body assist Assist for lower body dressing: Contact Guard/Touching assist     Toileting Toileting    Toileting assist Assist for toileting: Contact Guard/Touching assist Assistive Device Comment: Urinal   Transfers Chair/bed transfer  Transfers assist  Chair/bed transfer activity did not occur: Safety/medical concerns  Chair/bed transfer assist level: Supervision/Verbal cueing     Locomotion Ambulation   Ambulation assist   Ambulation activity did not occur: Safety/medical concerns  Assist level: Supervision/Verbal cueing Assistive device: Walker-rolling Max distance: 180   Walk 10 feet activity   Assist  Walk 10 feet activity did not occur: Safety/medical concerns  Assist level: Supervision/Verbal cueing Assistive device: Walker-rolling   Walk 50 feet activity   Assist Walk 50 feet with 2 turns activity did not occur: Safety/medical concerns  Assist level: Supervision/Verbal cueing  Assistive device: Walker-rolling    Walk 150 feet activity   Assist Walk 150 feet activity did not occur: Safety/medical concerns  Assist level: Supervision/Verbal cueing Assistive device:  Walker-rolling    Walk 10 feet on uneven surface  activity   Assist Walk 10 feet on uneven surfaces activity did not occur: Safety/medical concerns         Wheelchair     Assist Will patient use wheelchair at discharge?: Yes Type of Wheelchair: Manual    Wheelchair assist level: Supervision/Verbal cueing Max wheelchair distance: 100    Wheelchair 50 feet with 2 turns activity    Assist        Assist Level: Supervision/Verbal cueing   Wheelchair 150 feet activity     Assist      Assist Level: Minimal Assistance - Patient > 75%   Blood pressure 133/68, pulse 70, temperature 98.4 F (36.9 C), temperature source Oral, resp. rate 18, height 5\' 11"  (1.803 m), weight 129.1 kg, SpO2 99 %.  Medical Problem List and Plan: 1.  Right-sided weakness with dysarthria secondary to acute to subacute infarct multiple vascular territories involving bilateral brain parenchyma(left pons, inferior right cerebellum, left frontal lobe and right parietal lobe).  Small chronic right cerebellar infarct             -patient may  shower             -ELOS/Goals: tent D/C 5/20 goals Mod I to supervision  -Continue CIR 2.  Antithrombotics: -DVT/anticoagulation: Lovenox amb >300' may d/c             -antiplatelet therapy: Aspirin 81 mg daily, Plavix 75 mg daily 3. Pain Management: Neurontin 600 mg 3 times daily,  Flexeril discontinued secondary to urinary retention switch to methocarbamol 4. Mood: Provide emotional support             -antipsychotic agents: N/A 5. Neuropsych: This patient is capable of making decisions on his own behalf. 6. Skin/Wound Care: Routine skin checks 7. Fluids/Electrolytes/Nutrition: Routine in and outs with follow-up chemistries  5/16: stable  8.  Diabetes mellitus with peripheral neuropathy.  Hemoglobin A1c 6.6.  SSI. CBG (last 3)  Recent Labs    11/08/19 1733 11/08/19 2112 11/09/19 0606  GLUCAP 82 118* 104*  Controlled 5/16 at 104  9.   Permissive hypertension.  Monitor with increased mobility.  Patient on Cozaar 50 mg daily, Maxide 37.5-25 mg daily prior to admission.  Resume as needed Vitals:   11/09/19 0332 11/09/19 0334  BP: (!) 153/80 133/68  Pulse: 74 70  Resp:    Temp:    SpO2: 99%   Controlled off antihypertensive medications 5/16, 133/68 10.  Hyperlipidemia.  Lipitor 11. Gout flare- pt has had before- will start Colchicine 0.6 mg BID off Prednisone without recurrence   12.  Hx BPH will restart Flomax and monitor for orthostatic drops , + urinary retention , required ICP but has been taking Flexeril on a regular basis.  Will discontinue Flexeril as this may have some anticholinergic effects on the bladder 13. Lower extremity edema: Restart Lasix 20mg  daily. Recheck Cr tomorrow.   5/16: Cr stable and Lasix helping. Can continue dose.  14. Disposition: Given LE edema and risk factors, he should follow with Dr. whom his wife sees.   Pt states he passed a clot with urination and since then has been able to void LOS: 12 days A FACE TO FACE EVALUATION WAS PERFORMED  6/16 Raulkar 11/09/2019, 9:55 AM

## 2019-11-09 NOTE — Progress Notes (Signed)
Physical Therapy Session Note  Patient Details  Name: Jordan Chang MRN: [id number] Date of Birth: 21-Feb-1941  Today's Date: 11/09/2019 PT Individual Time: 1403-1500 PT Individual Time Calculation (min): 57 min    Short Term Goals: Week 2:  PT Short Term Goal 1 (Week 2): STG=LTG  Skilled Therapeutic Interventions/Progress Updates:    Pt seated in recliner upon PT arrival, agreeable to therapy tx and denies pain this afternoon. Pt ambulates to the dayroom x 150 ft with RW and supervision working on gait and endurance. Pt performed 2 x 11 sit<>stands for strengthening without UE support for LE strengthening, cues for full extension in standing and techniques. Pt ambulated to the gym x 150 ft with RW and supervision. Worked on dynamic standing balance this session without UE support to perform toe taps on aerobic step x 2 trials with min-mod assist, increased assist needed when tapping with L LE (standing on R LE). Pt worked on balance and hip strengthening to perform 2 x 10 lateral steps in place with single UE support on RW, min assist. Pt ambulated to the steps x 60 ft with RW and supervision, ascended/descended 4 steps x 2 with single rail and min assist, step to pattern with cues for techniques. Pt ambulated to parallel bars and performed sidestepping with B UE support on bar, 4 x 8 ft in both directions working on hip strength and balance, CGA. Pt ambulated x 200 ft back to the room with RW and supervision working on gait and endurance. Pt left in recliner at end of session with needs in reach and chair alarm set.   Therapy Documentation Precautions:  Precautions Precautions: Fall Restrictions Weight Bearing Restrictions: No    Therapy/Group: Individual Therapy  Cresenciano Genre, PT, DPT, CSRS 11/09/2019, 1:08 PM

## 2019-11-10 ENCOUNTER — Inpatient Hospital Stay (HOSPITAL_COMMUNITY): Payer: PPO | Admitting: Occupational Therapy

## 2019-11-10 ENCOUNTER — Inpatient Hospital Stay (HOSPITAL_COMMUNITY): Payer: PPO

## 2019-11-10 ENCOUNTER — Inpatient Hospital Stay (HOSPITAL_COMMUNITY): Payer: PPO | Admitting: Physical Therapy

## 2019-11-10 DIAGNOSIS — E1169 Type 2 diabetes mellitus with other specified complication: Secondary | ICD-10-CM

## 2019-11-10 LAB — GLUCOSE, CAPILLARY
Glucose-Capillary: 99 mg/dL (ref 70–99)
Glucose-Capillary: 100 mg/dL — ABNORMAL HIGH (ref 70–99)
Glucose-Capillary: 63 mg/dL — ABNORMAL LOW (ref 70–99)
Glucose-Capillary: 75 mg/dL (ref 70–99)
Glucose-Capillary: 80 mg/dL (ref 70–99)

## 2019-11-10 NOTE — Progress Notes (Signed)
[place] PHYSICAL MEDICINE & REHABILITATION PROGRESS NOTE   Subjective/Complaints: Up in chair finishing up with SLP. Excited about improvement in RUE and RLE strength.   ROS: Patient denies fever, rash, sore throat, blurred vision, nausea, vomiting, diarrhea, cough, shortness of breath or chest pain, joint or back pain, headache, or mood change.   Objective:   No results found. No results for input(s): WBC, HGB, HCT, PLT in the last 72 hours. Recent Labs    11/09/19 0713  NA 139  K 4.5  CL 105  CO2 26  GLUCOSE 118*  BUN 21  CREATININE 1.15  CALCIUM 9.6    Intake/Output Summary (Last 24 hours) at 11/10/2019 1407 Last data filed at 11/10/2019 1300 Gross per 24 hour  Intake 718 ml  Output 600 ml  Net 118 ml     Physical Exam: Vital Signs Blood pressure 136/64, pulse 79, temperature 98.1 F (36.7 C), resp. rate 18, height 5\' 11"  (1.803 m), weight 129.1 kg, SpO2 98 %. Constitutional: No distress . Vital signs reviewed. HEENT: EOMI, oral membranes moist Neck: supple Cardiovascular: RRR without murmur. No JVD    Respiratory/Chest: CTA Bilaterally without wheezes or rales. Normal effort    GI/Abdomen: BS +, non-tender, non-distended Ext: no clubbing, cyanosis. 1-2+ LEedema Psych: pleasant and cooperative Skin: No evidence of breakdown, no evidence of rash 5-10cm diameter ecchymosis on abd x 5-6 and other ecchymoses scattered on arms/legs as well Right 2d digit red with mild swelling. stable Neurologic: Cranial nerves II through XII intact, motor strength is 5/5 in left and 4- to 4/5 Right  deltoid, bicep, tricep, grip, hip flexor, knee extensors, ankle dorsiflexor and plantar flexor Sensation WNL  BUE and BLE Musculoskeletal:full ROM, no pain  Assessment/Plan: 1. Functional deficits secondary to bilateral CVA which require 3+ hours per day of interdisciplinary therapy in a comprehensive inpatient rehab setting.  Physiatrist is providing close team supervision and 24  hour management of active medical problems listed below.  Physiatrist and rehab team continue to assess barriers to discharge/monitor patient progress toward functional and medical goals  Care Tool:  Bathing  Bathing activity did not occur: Safety/medical concerns Body parts bathed by patient: Right arm, Chest, Abdomen, Right upper leg, Left upper leg, Face, Front perineal area, Left arm, Right lower leg, Left lower leg, Buttocks   Body parts bathed by helper: Right lower leg, Left lower leg     Bathing assist Assist Level: Contact Guard/Touching assist     Upper Body Dressing/Undressing Upper body dressing Upper body dressing/undressing activity did not occur (including orthotics): Refused What is the patient wearing?: Pull over shirt    Upper body assist Assist Level: Set up assist    Lower Body Dressing/Undressing Lower body dressing      What is the patient wearing?: Underwear/pull up, Pants     Lower body assist Assist for lower body dressing: Contact Guard/Touching assist     Toileting Toileting    Toileting assist Assist for toileting: Contact Guard/Touching assist Assistive Device Comment: Urinal   Transfers Chair/bed transfer  Transfers assist  Chair/bed transfer activity did not occur: Safety/medical concerns  Chair/bed transfer assist level: Supervision/Verbal cueing     Locomotion Ambulation   Ambulation assist   Ambulation activity did not occur: Safety/medical concerns  Assist level: Supervision/Verbal cueing Assistive device: Walker-rolling Max distance: 200 ft   Walk 10 feet activity   Assist  Walk 10 feet activity did not occur: Safety/medical concerns  Assist level: Supervision/Verbal cueing Assistive device: Walker-rolling  Walk 50 feet activity   Assist Walk 50 feet with 2 turns activity did not occur: Safety/medical concerns  Assist level: Supervision/Verbal cueing Assistive device: Walker-rolling    Walk 150 feet  activity   Assist Walk 150 feet activity did not occur: Safety/medical concerns  Assist level: Supervision/Verbal cueing Assistive device: Walker-rolling    Walk 10 feet on uneven surface  activity   Assist Walk 10 feet on uneven surfaces activity did not occur: Safety/medical concerns         Wheelchair     Assist Will patient use wheelchair at discharge?: Yes Type of Wheelchair: Manual    Wheelchair assist level: Supervision/Verbal cueing Max wheelchair distance: 100    Wheelchair 50 feet with 2 turns activity    Assist        Assist Level: Supervision/Verbal cueing   Wheelchair 150 feet activity     Assist      Assist Level: Minimal Assistance - Patient > 75%   Blood pressure 136/64, pulse 79, temperature 98.1 F (36.7 C), resp. rate 18, height 5\' 11"  (1.803 m), weight 129.1 kg, SpO2 98 %.  Medical Problem List and Plan: 1.  Right-sided weakness with dysarthria secondary to acute to subacute infarct multiple vascular territories involving bilateral brain parenchyma(left pons, inferior right cerebellum, left frontal lobe and right parietal lobe).  Small chronic right cerebellar infarct             -patient may  shower             -ELOS/Goals: tent D/C 5/20 goals Mod I to supervision  -Continue CIR 2.  Antithrombotics: -DVT/anticoagulation: with ambulation             -antiplatelet therapy: Aspirin 81 mg daily, Plavix 75 mg daily 3. Pain Management: Neurontin 600 mg 3 times daily,  Flexeril discontinued secondary to urinary retention switch to methocarbamol 4. Mood: Provide emotional support             -antipsychotic agents: N/A 5. Neuropsych: This patient is capable of making decisions on his own behalf. 6. Skin/Wound Care: Routine skin checks 7. Fluids/Electrolytes/Nutrition: Routine in and outs with follow-up chemistries  5/16: stable  8.  Diabetes mellitus with peripheral neuropathy.  Hemoglobin A1c 6.6.  SSI. CBG (last 3)  Recent Labs     11/10/19 0620 11/10/19 1134 11/10/19 1157  GLUCAP 99 63* 100*  Controlled 5/17  9.  Permissive hypertension.  Monitor with increased mobility.  Patient on Cozaar 50 mg daily, Maxide 37.5-25 mg daily prior to admission.  Resume as needed Vitals:   11/10/19 0438 11/10/19 1324  BP: 128/61 136/64  Pulse: 89 79  Resp:  18  Temp:  98.1 F (36.7 C)  SpO2: 99% 98%  Controlled off antihypertensive medications 5/17 10.  Hyperlipidemia.  Lipitor 11. Gout flare- pt has had before- will start Colchicine 0.6 mg BID off Prednisone without recurrence   12.  Hx BPH flomax restarted, flexeril stopped  5/17 seems to be emptying bladder well now.  13. Lower extremity edema: Restarted Lasix 20mg  daily.    5/16: Cr stable and Lasix helping. Can continue dose.  5/17 recheck bmet prior to dc  14. Disposition: Given LE edema and risk factors, he should follow with Dr. 6/16 whom his wife sees.   Pt states he passed a clot with urination and since then has been able to void LOS: 13 days A FACE TO FACE EVALUATION WAS PERFORMED  6/17 11/10/2019, 2:07 PM

## 2019-11-10 NOTE — Progress Notes (Signed)
Physical Therapy Session Note  Patient Details  Name: Jordan Chang MRN: [id number] Date of Birth: December 21, 1940  Today's Date: 11/10/2019 PT Individual Time: 1015-1130 PT Individual Time Calculation (min): 75 min   Short Term Goals: Week 2:  PT Short Term Goal 1 (Week 2): STG=LTG  Skilled Therapeutic Interventions/Progress Updates: Pt presented in recliner agreeable to therapy. Pt denies pain during session. Performed STS from recliner supervision and ambulated to rehab gym with RW and supervision. After seated rest pt participated in ascending/descending x 12 steps with R rail only. Pt ascended via sidestepping and descended backwards. Pt then ambulated to mat and participated in toe taps to 6 in step 2 x 10. Pt also performed toe taps 2 x 5 no AD focusing on R hip flexors. Participated in STS no AD x 10, then with 1Kg weighted ball for balance and forced use of RUE. Participated in re-bounder with 1Kg weighted ball without AD for forced use of RUE and dynamic balance. Participated in obstacle course with RW weaving through cones and stepping over hockey stick. Pt with decreased R foot clearance while stepping over stick. Pt also required min cues for safe management with RW while weaving through cones. Pt ambulated rehab gym to day room with RW and CGA due to increased fatigue and noted shorter R step length. Participated in [place] for cardiovascular activity with pt maintaining avg 60 SPM. Pt then performed x 2 min BLE only for strengthening. Pt then transferred to w/c and propelled w/c back to room with BUE for forced use of RUE. Pt then performed ambulatory transfer to recliner with RW and supervision. Pt left with recliner with seat alarm on, call bell within reach and needs met.      Therapy Documentation Precautions:  Precautions Precautions: Fall Restrictions Weight Bearing Restrictions: No General:   Vital Signs: Therapy Vitals Temp: 98.1 F (36.7 C) Pulse Rate: 79 Resp:  18 BP: 136/64 Patient Position (if appropriate): Sitting Oxygen Therapy SpO2: 98 % O2 Device: Room Air  Therapy/Group: Individual Therapy  Tayden Duran  Makendra Vigeant, PTA  11/10/2019, 4:12 PM

## 2019-11-10 NOTE — Progress Notes (Signed)
Occupational Therapy Session Note  Patient Details  Name: Jordan Chang MRN: [id number] Date of Birth: 06-21-1941  Today's Date: 11/10/2019 OT Individual Time: 0700-0809 and 1415- 1445 OT Individual Time Calculation (min): 69 min and 45 mins   Short Term Goals: Week 2:  OT Short Term Goal 1 (Week 2): STGs=LTGs secondary to upcoming discharge  Skilled Therapeutic Interventions/Progress Updates:    Session 1: Upon entering the room, pt seated in recliner chair and sleeping soundly. Pt easily aroused for OT participation with no c/o pian this session. Pt verbalized need for toileting and ambulated into bathroom with close supervision. Pt able to have BM and urinate this session. Pt performing hygiene while standing with encouragement and CGA for balance. Pt exiting the bathroom and standing at sink for 8 minutes for hand hygiene and grooming tasks with close supervision. Pt dropping items on floor and picking up with CGA for safety with balance. Pt taking seated rest break in recliner chair. He demonstrated stacking of small items and palmar translation task from several days ago with 1/10 drop from R hand. Much improved from last time. OT discussing need of shower equipment and shower chair vs. TTB. Pt called spouse and OT spoke to her about taking measurements and pictures for afternoon. OT presented pt with simulated shower threshold and pt demonstrated step over side ways leading with L foot first and CGA for balance x 10 reps. Pt returning to recliner chair at end of session with call bell and all needed items within reach.   Session 2: Upon entering the room, pt seated in recliner chair with wife present in the room. Pt requesting to use bathroom and stands with supervision from chair. Pt reaching forwards to open door to bathroom without LOB.  Pt ambulating short distance into bathroom with RW and supervision. Pt performed clothing management with supervision as well. Hand hygiene while  standing at sink with supervision before returning to recliner chair for rest break. Caregiver providing OT with bathroom measurements and it appears that pt would need shower chair based on dimensions of shower. Pt ambulating 10' to entrance of room and then backwards x 5 reps initially CGA progressing to close supervision with min cuing for technique. Pt returning to sit in recliner chair at end of session with call bell and all needed items within reach.   Therapy Documentation Precautions:  Precautions Precautions: Fall Restrictions Weight Bearing Restrictions: No Vital Signs: Therapy Vitals Pulse Rate: 89 BP: 128/61 Patient Position (if appropriate): Standing Oxygen Therapy SpO2: 99 %   Therapy/Group: Individual Therapy  Alen Bleacher 11/10/2019, 8:35 AM

## 2019-11-10 NOTE — Progress Notes (Signed)
Speech Language Pathology Daily Session Note  Patient Details  Name: Jordan Chang MRN: [id number] Date of Birth: 01/29/1941  Today's Date: 11/10/2019 SLP Individual Time: 4098-1191 SLP Individual Time Calculation (min): 40 min  Short Term Goals: Week 2: SLP Short Term Goal 1 (Week 2): STG=LTG due to short ELOS  Skilled Therapeutic Interventions: Skilled ST services focused on cognitive skills. Pt requested to use bathroom and required supervision A ambulating/transfering with walker. SLP facilitated complex problem solving skills in novel organization and inference task, pt required supervision A verbal cues. Pt demonstrated recall of medication fucntion mod I. Pt was left in room with call bell within reach and chair alarm set. ST recommends to continue skilled ST services.      Pain Pain Assessment Pain Scale: 0-10 Pain Score: 0-No pain  Therapy/Group: Individual Therapy  [name]  [facility] 11/10/2019, 7:54 AM

## 2019-11-11 ENCOUNTER — Inpatient Hospital Stay (HOSPITAL_COMMUNITY): Payer: PPO | Admitting: Physical Therapy

## 2019-11-11 ENCOUNTER — Inpatient Hospital Stay (HOSPITAL_COMMUNITY): Payer: PPO | Admitting: Occupational Therapy

## 2019-11-11 ENCOUNTER — Encounter (HOSPITAL_COMMUNITY): Payer: Self-pay | Admitting: Physical Medicine & Rehabilitation

## 2019-11-11 LAB — GLUCOSE, CAPILLARY
Glucose-Capillary: 110 mg/dL — ABNORMAL HIGH (ref 70–99)
Glucose-Capillary: 102 mg/dL — ABNORMAL HIGH (ref 70–99)
Glucose-Capillary: 89 mg/dL (ref 70–99)
Glucose-Capillary: 100 mg/dL — ABNORMAL HIGH (ref 70–99)

## 2019-11-11 NOTE — Progress Notes (Signed)
Occupational Therapy Session Note  Patient Details  Name: Jordan Chang MRN: [id number] Date of Birth: 07-22-1940  Today's Date: 11/11/2019 OT Individual Time: 0700-0755 OT Individual Time Calculation (min): 55 min    Short Term Goals: Week 1:  OT Short Term Goal 1 (Week 1): Pt will perform toilet transfer with mod A overall. OT Short Term Goal 1 - Progress (Week 1): Met OT Short Term Goal 2 (Week 1): Pt will perform toileting with mod A overall. OT Short Term Goal 2 - Progress (Week 1): Met OT Short Term Goal 3 (Week 1): Pt will perform LB dressing with mod A overall. OT Short Term Goal 3 - Progress (Week 1): Met Week 2:  OT Short Term Goal 1 (Week 2): STGs=LTGs secondary to upcoming discharge  Skilled Therapeutic Interventions/Progress Updates:    Upon entering the room, pt seated in recliner chair with no c/o pain and finishing breakfast. Pt feeding self with R UE and it appeared slower but no spills from utensils. Pt requesting to use bathroom and ambulates with RW and supervision into bathroom. Pt able to have BM and doffs clothing while seated for safety. Pt standing to perform hygiene himself! Stand pivot transfer from commode to TTB with supervision. Pt bathing while seated with one stand to wash buttocks and peri area further with supervision overall. Pt exiting the bathroom and returning to sit in recliner chair for dressing tasks. Pt needing min cuing for hemiplegic dressing technique. Min A to don B TED hose and socks. Pt standing at sink for all grooming tasks with supervision as well. Pt returning to recliner chair with call bell and all needed items within reach.   Therapy Documentation Precautions:  Precautions Precautions: Fall Restrictions Weight Bearing Restrictions: No General:   Vital Signs: Therapy Vitals Temp: 98.7 F (37.1 C) Temp Source: Oral Pulse Rate: 81 Resp: 19 BP: 127/60 Patient Position (if appropriate): Standing Oxygen Therapy SpO2: 95 % O2  Device: Room Air Pain:   ADL:   Vision   Perception    Praxis   Exercises:   Other Treatments:     Therapy/Group: Individual Therapy  Gypsy Decant 11/11/2019, 7:59 AM

## 2019-11-11 NOTE — Progress Notes (Signed)
Occupational Therapy Session Note  Patient Details  Name: Jordan Chang MRN: [id number] Date of Birth: 02/12/1941  Today's Date: 11/11/2019 OT Individual Time: 1400-1445 OT Individual Time Calculation (min): 45 min    Short Term Goals: Week 2:  OT Short Term Goal 1 (Week 2): STGs=LTGs secondary to upcoming discharge  Skilled Therapeutic Interventions/Progress Updates:  Session 1: Patient met seated in recliner in agreement with OT treatment session with focus on RUE NMR as detailed below. Patient provided with hand and finger coordination activity with noted difficulty crumpling and straightening paper with R hand and isolated flexion/extension of digits 1-5 of R hand. Rep from [facility] present to educate patient on [facility] for night time use to prevent foot drop. Patient with additional questions about PRAFOs after rep left room. OT answered questions to patients satisfaction. Session concluded with patient seated in recliner with call bell within reach, belt alarm activated and all needs met.   Therapy Documentation Precautions:  Precautions Precautions: Fall Restrictions Weight Bearing Restrictions: No  Therapy/Group: Individual Therapy  [name] 11/11/2019, 8:01 AM

## 2019-11-11 NOTE — Progress Notes (Signed)
[place] PHYSICAL MEDICINE & REHABILITATION PROGRESS NOTE   Subjective/Complaints: No complaints this morning. Very excited about his progress.  Edema continues to decrease  ROS: Patient denies fever, rash, sore throat, blurred vision, nausea, vomiting, diarrhea, cough, shortness of breath or chest pain, joint or back pain, headache, or mood change.   Objective:   No results found. No results for input(s): WBC, HGB, HCT, PLT in the last 72 hours. Recent Labs    11/09/19 0713  NA 139  K 4.5  CL 105  CO2 26  GLUCOSE 118*  BUN 21  CREATININE 1.15  CALCIUM 9.6    Intake/Output Summary (Last 24 hours) at 11/11/2019 1308 Last data filed at 11/11/2019 0700 Gross per 24 hour  Intake 290 ml  Output 300 ml  Net -10 ml     Physical Exam: Vital Signs Blood pressure 127/60, pulse 81, temperature 98.7 F (37.1 C), temperature source Oral, resp. rate 19, height 5\' 11"  (1.803 m), weight 129.1 kg, SpO2 95 %. Constitutional: No distress . Vital signs reviewed. HEENT: EOMI, oral membranes moist Neck: supple Cardiovascular: RRR without murmur. No JVD    Respiratory/Chest: CTA Bilaterally without wheezes or rales. Normal effort    GI/Abdomen: BS +, non-tender, non-distended Ext: no clubbing, cyanosis. Edema decreasing.  Psych: pleasant and cooperative Skin: No evidence of breakdown, no evidence of rash 5-10cm diameter ecchymosis on abd x 5-6 and other ecchymoses scattered on arms/legs as well Right 2d digit red with mild swelling. stable Neurologic: Cranial nerves II through XII intact, motor strength is 5/5 in left and 4- to 4/5 Right  deltoid, bicep, tricep, grip, hip flexor, knee extensors, ankle dorsiflexor and plantar flexor Sensation WNL  BUE and BLE Musculoskeletal:full ROM, no pain  Assessment/Plan: 1. Functional deficits secondary to bilateral CVA which require 3+ hours per day of interdisciplinary therapy in a comprehensive inpatient rehab setting.  Physiatrist is  providing close team supervision and 24 hour management of active medical problems listed below.  Physiatrist and rehab team continue to assess barriers to discharge/monitor patient progress toward functional and medical goals  Care Tool:  Bathing  Bathing activity did not occur: Safety/medical concerns Body parts bathed by patient: Right arm, Chest, Abdomen, Right upper leg, Left upper leg, Face, Front perineal area, Left arm, Right lower leg, Left lower leg, Buttocks   Body parts bathed by helper: Right lower leg, Left lower leg     Bathing assist Assist Level: Supervision/Verbal cueing     Upper Body Dressing/Undressing Upper body dressing Upper body dressing/undressing activity did not occur (including orthotics): Refused What is the patient wearing?: Pull over shirt    Upper body assist Assist Level: Supervision/Verbal cueing    Lower Body Dressing/Undressing Lower body dressing      What is the patient wearing?: Underwear/pull up, Pants     Lower body assist Assist for lower body dressing: Supervision/Verbal cueing     Toileting Toileting    Toileting assist Assist for toileting: Supervision/Verbal cueing Assistive Device Comment: Urinal   Transfers Chair/bed transfer  Transfers assist  Chair/bed transfer activity did not occur: Safety/medical concerns  Chair/bed transfer assist level: Supervision/Verbal cueing     Locomotion Ambulation   Ambulation assist   Ambulation activity did not occur: Safety/medical concerns  Assist level: Supervision/Verbal cueing Assistive device: Walker-rolling Max distance: 181ft   Walk 10 feet activity   Assist  Walk 10 feet activity did not occur: Safety/medical concerns  Assist level: Supervision/Verbal cueing Assistive device: Walker-rolling   Walk  50 feet activity   Assist Walk 50 feet with 2 turns activity did not occur: Safety/medical concerns  Assist level: Supervision/Verbal cueing Assistive device:  Walker-rolling    Walk 150 feet activity   Assist Walk 150 feet activity did not occur: Safety/medical concerns  Assist level: Supervision/Verbal cueing Assistive device: Walker-rolling    Walk 10 feet on uneven surface  activity   Assist Walk 10 feet on uneven surfaces activity did not occur: Safety/medical concerns         Wheelchair     Assist Will patient use wheelchair at discharge?: No Type of Wheelchair: Manual    Wheelchair assist level: Supervision/Verbal cueing Max wheelchair distance: 110ft    Wheelchair 50 feet with 2 turns activity    Assist        Assist Level: Supervision/Verbal cueing   Wheelchair 150 feet activity     Assist      Assist Level: Supervision/Verbal cueing   Blood pressure 127/60, pulse 81, temperature 98.7 F (37.1 C), temperature source Oral, resp. rate 19, height 5\' 11"  (1.803 m), weight 129.1 kg, SpO2 95 %.  Medical Problem List and Plan: 1.  Right-sided weakness with dysarthria secondary to acute to subacute infarct multiple vascular territories involving bilateral brain parenchyma(left pons, inferior right cerebellum, left frontal lobe and right parietal lobe).  Small chronic right cerebellar infarct             -patient may  shower             -ELOS/Goals: tent D/C 5/20 goals Mod I to supervision  -Continue CIR 2.  Antithrombotics: -DVT/anticoagulation: with ambulation             -antiplatelet therapy: Aspirin 81 mg daily, Plavix 75 mg daily 3. Pain Management: Neurontin 600 mg 3 times daily,  Flexeril discontinued secondary to urinary retention switch to methocarbamol. Well controlled 4. Mood: Provide emotional support             -antipsychotic agents: N/A 5. Neuropsych: This patient is capable of making decisions on his own behalf. 6. Skin/Wound Care: Routine skin checks 7. Fluids/Electrolytes/Nutrition: Routine in and outs with follow-up chemistries  5/16: stable  8.  Diabetes mellitus with  peripheral neuropathy.  Hemoglobin A1c 6.6.  SSI. CBG (last 3)  Recent Labs    11/10/19 2126 11/11/19 0621 11/11/19 1144  GLUCAP 80 110* 102*  Controlled 5/18 at 102 9.  Permissive hypertension.  Monitor with increased mobility.  Patient on Cozaar 50 mg daily, Maxide 37.5-25 mg daily prior to admission.  Resume as needed Vitals:   11/11/19 0512 11/11/19 0515  BP: 138/68 127/60  Pulse: 69 81  Resp:    Temp:  98.7 F (37.1 C)  SpO2: 93% 95%  Controlled off antihypertensive medications 5/18 at 127/60 10.  Hyperlipidemia.  Lipitor 11. Gout flare- pt has had before- will start Colchicine 0.6 mg BID off Prednisone without recurrence   12.  Hx BPH flomax restarted, flexeril stopped  5/17 seems to be emptying bladder well now.  13. Lower extremity edema: Restarted Lasix 20mg  daily.    5/16: Cr stable and Lasix helping. Can continue dose.  5/17 recheck bmet prior to dc  14. Disposition: Given LE edema and risk factors, he should follow with Dr. Clayborn Bigness whom his wife sees.   Pt states he passed a clot with urination and since then has been able to void LOS: 14 days A FACE TO FACE EVALUATION WAS PERFORMED  Martha Clan P Dariella Gillihan 11/11/2019, 1:08  PM    

## 2019-11-11 NOTE — Discharge Instructions (Signed)
Inpatient Rehab Discharge Instructions  Jordan Chang Discharge date and time: No discharge date for patient encounter.   Activities/Precautions/ Functional Status: Activity: activity as tolerated Diet: diabetic diet Wound Care: none needed Functional status:  ___ No restrictions     ___ Walk up steps independently ___ 24/7 supervision/assistance   ___ Walk up steps with assistance ___ Intermittent supervision/assistance  ___ Bathe/dress independently ___ Walk with walker     _x__ Bathe/dress with assistance ___ Walk Independently    ___ Shower independently ___ Walk with assistance    ___ Shower with assistance ___ No alcohol     ___ Return to work/school ________ COMMUNITY REFERRALS UPON DISCHARGE:    Home Health:   PT     OT     ST                    Agency: [facility]  Phone:[phone]   Medical Equipment/Items Ordered:Shower Chair                                                 Agency/Supplier:[employer]  Special Instructions: No driving smoking or alcohol STROKE/TIA DISCHARGE INSTRUCTIONS SMOKING Cigarette smoking nearly doubles your risk of having a stroke & is the single most alterable risk factor  If you smoke or have smoked in the last 12 months, you are advised to quit smoking for your health.  Most of the excess cardiovascular risk related to smoking disappears within a year of stopping.  Ask you doctor about anti-smoking medications  [facility] Quit Line: 1-800-QUIT NOW  Free Smoking Cessation Classes [phone]  CHOLESTEROL Know your levels; limit fat & cholesterol in your diet  Lipid Panel     Component Value Date/Time   CHOL 205 (H) 10/26/2019 0214   TRIG 80 10/26/2019 0214   HDL 56 10/26/2019 0214   CHOLHDL 3.7 10/26/2019 0214   VLDL 16 10/26/2019 0214   LDLCALC 133 (H) 10/26/2019 0214      Many patients benefit from treatment even if their cholesterol is at goal.  Goal: Total Cholesterol (CHOL) less than 160  Goal:  Triglycerides (TRIG) less than  150  Goal:  HDL greater than 40  Goal:  LDL (LDLCALC) less than 100   BLOOD PRESSURE American Stroke Association blood pressure target is less that 120/80 mm/Hg  Your discharge blood pressure is:  BP: (!) 130/44  Monitor your blood pressure  Limit your salt and alcohol intake  Many individuals will require more than one medication for high blood pressure  DIABETES (A1c is a blood sugar average for last 3 months) Goal HGBA1c is under 7% (HBGA1c is blood sugar average for last 3 months)  Diabetes:     Lab Results  Component Value Date   HGBA1C 6.6 (H) 10/26/2019     Your HGBA1c can be lowered with medications, healthy diet, and exercise.  Check your blood sugar as directed by your physician  Call your physician if you experience unexplained or low blood sugars.  PHYSICAL ACTIVITY/REHABILITATION Goal is 30 minutes at least 4 days per week  Activity: Increase activity slowly, Therapies: Physical Therapy: Home Health Return to work:   Activity decreases your risk of heart attack and stroke and makes your heart stronger.  It helps control your weight and blood pressure; helps you relax and can improve your mood.  Participate in  a regular exercise program.  Talk with your doctor about the best form of exercise for you (dancing, walking, swimming, cycling).  DIET/WEIGHT Goal is to maintain a healthy weight  Your discharge diet is:  Diet Order            Diet heart healthy/carb modified Room service appropriate? Yes; Fluid consistency: Thin  Diet effective now              liquids Your height is:  Height: 5\' 11"  (180.3 cm) Your current weight is: Weight: 129.1 kg Your Body Mass Index (BMI) is:  BMI (Calculated): 39.71  Following the type of diet specifically designed for you will help prevent another stroke.  Your goal weight range is:    Your goal Body Mass Index (BMI) is 19-24.  Healthy food habits can help reduce 3 risk factors for stroke:  High cholesterol,  hypertension, and excess weight.  RESOURCES Stroke/Support Group:  Call [phone]   STROKE EDUCATION PROVIDED/REVIEWED AND GIVEN TO PATIENT Stroke warning signs and symptoms How to activate emergency medical system (call 911). Medications prescribed at discharge. Need for follow-up after discharge. Personal risk factors for stroke. Pneumonia vaccine given:  Flu vaccine given:  My questions have been answered, the writing is legible, and I understand these instructions.  I will adhere to these goals & educational materials that have been provided to me after my discharge from the hospital.      My questions have been answered and I understand these instructions. I will adhere to these goals and the provided educational materials after my discharge from the hospital.  Patient/Caregiver Signature _______________________________ Date __________  Clinician Signature _______________________________________ Date __________  Please bring this form and your medication list with you to all your follow-up doctor's appointments.

## 2019-11-11 NOTE — Discharge Summary (Signed)
Physician Discharge Summary  Patient ID: Jordan Chang MRN: [id number] DOB/AGE: 1940-10-03 79 y.o.  Admit date: 10/28/2019 Discharge date: 11/13/2019  Discharge Diagnoses:  Principal Problem:   Left pontine cerebrovascular accident [facility]) Active Problems:   Type 2 diabetes mellitus with other specified complication [facility])   Essential hypertension DVT prophylaxis Pain management Hyperlipidemia Gout BPH Morbid obesity  Discharged Condition: Stable  Significant Diagnostic Studies: CT Angio Head W or Wo Contrast  Result Date: 10/26/2019 CLINICAL DATA:  Slurred speech and right-sided weakness EXAM: CT ANGIOGRAPHY HEAD AND NECK CT PERFUSION BRAIN TECHNIQUE: Multidetector CT imaging of the head and neck was performed using the standard protocol during bolus administration of intravenous contrast. Multiplanar CT image reconstructions and MIPs were obtained to evaluate the vascular anatomy. Carotid stenosis measurements (when applicable) are obtained utilizing NASCET criteria, using the distal internal carotid diameter as the denominator. Multiphase CT imaging of the brain was performed following IV bolus contrast injection. Subsequent parametric perfusion maps were calculated using RAPID software. CONTRAST:  OMNIPAQUE IOHEXOL 350 MG/ML SOLN COMPARISON:  None. FINDINGS: CT HEAD FINDINGS Brain: There is no mass, hemorrhage or extra-axial collection. The size and configuration of the ventricles and extra-axial CSF spaces are normal. No acute cortical infarct. There is hypoattenuation of the periventricular white matter, most commonly indicating chronic ischemic microangiopathy. Skull: The visualized skull base, calvarium and extracranial soft tissues are normal. Sinuses/Orbits: No fluid levels or advanced mucosal thickening of the visualized paranasal sinuses. No mastoid or middle ear effusion. The orbits are normal. CTA NECK FINDINGS SKELETON: Grade 1 anterolisthesis at C4-5. OTHER NECK: Normal  pharynx, larynx and major salivary glands. No cervical lymphadenopathy. Unremarkable thyroid gland. UPPER CHEST: No pneumothorax or pleural effusion. No nodules or masses. AORTIC ARCH: There is mild calcific atherosclerosis of the aortic arch. There is no aneurysm, dissection or hemodynamically significant stenosis of the visualized portion of the aorta. Conventional 3 vessel aortic branching pattern. The visualized proximal subclavian arteries are widely patent. RIGHT CAROTID SYSTEM: No dissection, occlusion or aneurysm. Mild atherosclerotic calcification at the carotid bifurcation without hemodynamically significant stenosis. LEFT CAROTID SYSTEM: No dissection, occlusion or aneurysm. Mild atherosclerotic calcification at the carotid bifurcation without hemodynamically significant stenosis. VERTEBRAL ARTERIES: Right dominant configuration. The left vertebral artery is non-opacified at multiple locations of the V1 segment and proximal V2 segment. The distal V4 segment is also poorly opacified, but this might be partially due to poor contrast bolus. There is multifocal mild atherosclerosis of the right vertebral artery. CTA HEAD FINDINGS POSTERIOR CIRCULATION: --Vertebral arteries: Non opacification of the distal V4 segment of the left vertebral artery. Moderate atherosclerotic narrowing of the right V4 segment. --Posterior inferior cerebellar arteries (PICA): Patent origins from the vertebral arteries. --Anterior inferior cerebellar arteries (AICA): Patent origins from the basilar artery. --Basilar artery: Normal. --Superior cerebellar arteries: Normal. --Posterior cerebral arteries: Normal. Both originate from the basilar artery. Posterior communicating arteries (p-comm) are diminutive or absent. ANTERIOR CIRCULATION: --Intracranial internal carotid arteries: Normal. --Anterior cerebral arteries (ACA): Normal. Both A1 segments are present. Patent anterior communicating artery (a-comm). --Middle cerebral arteries  (MCA): Normal. VENOUS SINUSES: As permitted by contrast timing, patent. ANATOMIC VARIANTS: None Review of the MIP images confirms the above findings. CT Brain Perfusion Findings: ASPECTS: 10 CBF (<30%) Volume: 48mL Perfusion (Tmax>6.0s) volume: 46mL Mismatch Volume: 55mL Infarction Location:None Note: The calculated area of ischemia is likely artifactual and appears to correspond to the CSF space in the posterior fossa. IMPRESSION: 1. No emergent large vessel occlusion or high-grade stenosis of  the intracranial arteries. 2. Occlusion of the left vertebral artery at multiple locations of the V1 segment and proximal V2 segment, with poor opacification of the distal V4 segment. This is likely secondary to atherosclerotic disease and exaggerated by a relatively poor contrast bolus. 3. No acute infarct by CT perfusion analysis. Small region of calculated penumbra in the left posterior fossa is considered artifactual. 4. Aortic Atherosclerosis (ICD10-I70.0). Electronically Signed   By: Deatra Robinson M.D.   On: 10/26/2019 03:23   CT ANGIO NECK W OR WO CONTRAST  Result Date: 10/26/2019 CLINICAL DATA:  Slurred speech and right-sided weakness EXAM: CT ANGIOGRAPHY HEAD AND NECK CT PERFUSION BRAIN TECHNIQUE: Multidetector CT imaging of the head and neck was performed using the standard protocol during bolus administration of intravenous contrast. Multiplanar CT image reconstructions and MIPs were obtained to evaluate the vascular anatomy. Carotid stenosis measurements (when applicable) are obtained utilizing NASCET criteria, using the distal internal carotid diameter as the denominator. Multiphase CT imaging of the brain was performed following IV bolus contrast injection. Subsequent parametric perfusion maps were calculated using RAPID software. CONTRAST:  OMNIPAQUE IOHEXOL 350 MG/ML SOLN COMPARISON:  None. FINDINGS: CT HEAD FINDINGS Brain: There is no mass, hemorrhage or extra-axial collection. The size and  configuration of the ventricles and extra-axial CSF spaces are normal. No acute cortical infarct. There is hypoattenuation of the periventricular white matter, most commonly indicating chronic ischemic microangiopathy. Skull: The visualized skull base, calvarium and extracranial soft tissues are normal. Sinuses/Orbits: No fluid levels or advanced mucosal thickening of the visualized paranasal sinuses. No mastoid or middle ear effusion. The orbits are normal. CTA NECK FINDINGS SKELETON: Grade 1 anterolisthesis at C4-5. OTHER NECK: Normal pharynx, larynx and major salivary glands. No cervical lymphadenopathy. Unremarkable thyroid gland. UPPER CHEST: No pneumothorax or pleural effusion. No nodules or masses. AORTIC ARCH: There is mild calcific atherosclerosis of the aortic arch. There is no aneurysm, dissection or hemodynamically significant stenosis of the visualized portion of the aorta. Conventional 3 vessel aortic branching pattern. The visualized proximal subclavian arteries are widely patent. RIGHT CAROTID SYSTEM: No dissection, occlusion or aneurysm. Mild atherosclerotic calcification at the carotid bifurcation without hemodynamically significant stenosis. LEFT CAROTID SYSTEM: No dissection, occlusion or aneurysm. Mild atherosclerotic calcification at the carotid bifurcation without hemodynamically significant stenosis. VERTEBRAL ARTERIES: Right dominant configuration. The left vertebral artery is non-opacified at multiple locations of the V1 segment and proximal V2 segment. The distal V4 segment is also poorly opacified, but this might be partially due to poor contrast bolus. There is multifocal mild atherosclerosis of the right vertebral artery. CTA HEAD FINDINGS POSTERIOR CIRCULATION: --Vertebral arteries: Non opacification of the distal V4 segment of the left vertebral artery. Moderate atherosclerotic narrowing of the right V4 segment. --Posterior inferior cerebellar arteries (PICA): Patent origins from the  vertebral arteries. --Anterior inferior cerebellar arteries (AICA): Patent origins from the basilar artery. --Basilar artery: Normal. --Superior cerebellar arteries: Normal. --Posterior cerebral arteries: Normal. Both originate from the basilar artery. Posterior communicating arteries (p-comm) are diminutive or absent. ANTERIOR CIRCULATION: --Intracranial internal carotid arteries: Normal. --Anterior cerebral arteries (ACA): Normal. Both A1 segments are present. Patent anterior communicating artery (a-comm). --Middle cerebral arteries (MCA): Normal. VENOUS SINUSES: As permitted by contrast timing, patent. ANATOMIC VARIANTS: None Review of the MIP images confirms the above findings. CT Brain Perfusion Findings: ASPECTS: 10 CBF (<30%) Volume: 76mL Perfusion (Tmax>6.0s) volume: 14mL Mismatch Volume: 95mL Infarction Location:None Note: The calculated area of ischemia is likely artifactual and appears to correspond to the CSF  space in the posterior fossa. IMPRESSION: 1. No emergent large vessel occlusion or high-grade stenosis of the intracranial arteries. 2. Occlusion of the left vertebral artery at multiple locations of the V1 segment and proximal V2 segment, with poor opacification of the distal V4 segment. This is likely secondary to atherosclerotic disease and exaggerated by a relatively poor contrast bolus. 3. No acute infarct by CT perfusion analysis. Small region of calculated penumbra in the left posterior fossa is considered artifactual. 4. Aortic Atherosclerosis (ICD10-I70.0). Electronically Signed   By: Deatra Robinson M.D.   On: 10/26/2019 03:23   MR BRAIN WO CONTRAST  Result Date: 10/26/2019 CLINICAL DATA:  Slurred speech right leg weakness, code stroke follow-up EXAM: MRI HEAD WITHOUT CONTRAST TECHNIQUE: Multiplanar, multiecho pulse sequences of the brain and surrounding structures were obtained without intravenous contrast. COMPARISON:  Correlation made with prior CT imaging FINDINGS: Brain: There is an  approximately 11 mm focus of mildly reduced diffusion involving the left aspect of pons. Additional smaller foci of mildly reduced diffusion the inferior right cerebellum (image 8), left frontal lobe (image 39), and right parietal lobe (image 34). There is no evidence of intracranial hemorrhage. There is no intracranial mass, mass effect, or edema. There is no hydrocephalus or extra-axial fluid collection. Patchy foci of T2 hyperintensity in the supratentorial white matter are nonspecific but may reflect mild to moderate chronic microvascular ischemic changes. Small chronic right cerebellar infarcts. Prominence of ventricles and sulci reflects minor generalized parenchymal volume loss. Vascular: Diminished flow void of diminutive left vertebral artery. Major vessel flow voids at the skull base are otherwise preserved. Skull and upper cervical spine: Normal marrow signal is preserved. Sinuses/Orbits: Minor mucosal thickening. Bilateral lens replacements. Other: Sella is unremarkable.  Mastoid air cells are clear. IMPRESSION: Few small acute to subacute infarcts involving multiple vascular territories. Chronic microvascular ischemic changes. Small chronic right cerebellar infarcts. Electronically Signed   By: Guadlupe Spanish M.D.   On: 10/26/2019 07:50   US Venous Img Lower Unilateral Left (DVT)  Result Date: 10/26/2019 CLINICAL DATA:  Left lower extremity edema. EXAM: LEFT LOWER EXTREMITY VENOUS DOPPLER ULTRASOUND TECHNIQUE: Gray-scale sonography with graded compression, as well as color Doppler and duplex ultrasound were performed to evaluate the lower extremity deep venous systems from the level of the common femoral vein and including the common femoral, femoral, profunda femoral, popliteal and calf veins including the posterior tibial, peroneal and gastrocnemius veins when visible. The superficial great saphenous vein was also interrogated. Spectral Doppler was utilized to evaluate flow at rest and with distal  augmentation maneuvers in the common femoral, femoral and popliteal veins. COMPARISON:  02/03/2016 FINDINGS: Contralateral Common Femoral Vein: Respiratory phasicity is normal and symmetric with the symptomatic side. No evidence of thrombus. Normal compressibility. Common Femoral Vein: No evidence of thrombus. Normal compressibility, respiratory phasicity and response to augmentation. Saphenofemoral Junction: No evidence of thrombus. Normal compressibility and flow on color Doppler imaging. Profunda Femoral Vein: No evidence of thrombus. Normal compressibility and flow on color Doppler imaging. Femoral Vein: No evidence of thrombus. Normal compressibility, respiratory phasicity and response to augmentation. Popliteal Vein: No evidence of thrombus. Normal compressibility, respiratory phasicity and response to augmentation. Calf Veins: No evidence of thrombus. Normal compressibility and flow on color Doppler imaging. Superficial Great Saphenous Vein: No evidence of thrombus. Normal compressibility. Venous Reflux:  None. Other Findings: No evidence of superficial thrombophlebitis or abnormal fluid collection. IMPRESSION: No evidence of left lower extremity deep venous thrombosis. Electronically Signed   By: Sherrine Maples  Fredia Sorrow M.D.   On: 10/26/2019 14:43   CT CEREBRAL PERFUSION W CONTRAST  Result Date: 10/26/2019 CLINICAL DATA:  Slurred speech and right-sided weakness EXAM: CT ANGIOGRAPHY HEAD AND NECK CT PERFUSION BRAIN TECHNIQUE: Multidetector CT imaging of the head and neck was performed using the standard protocol during bolus administration of intravenous contrast. Multiplanar CT image reconstructions and MIPs were obtained to evaluate the vascular anatomy. Carotid stenosis measurements (when applicable) are obtained utilizing NASCET criteria, using the distal internal carotid diameter as the denominator. Multiphase CT imaging of the brain was performed following IV bolus contrast injection. Subsequent parametric  perfusion maps were calculated using RAPID software. CONTRAST:  OMNIPAQUE IOHEXOL 350 MG/ML SOLN COMPARISON:  None. FINDINGS: CT HEAD FINDINGS Brain: There is no mass, hemorrhage or extra-axial collection. The size and configuration of the ventricles and extra-axial CSF spaces are normal. No acute cortical infarct. There is hypoattenuation of the periventricular white matter, most commonly indicating chronic ischemic microangiopathy. Skull: The visualized skull base, calvarium and extracranial soft tissues are normal. Sinuses/Orbits: No fluid levels or advanced mucosal thickening of the visualized paranasal sinuses. No mastoid or middle ear effusion. The orbits are normal. CTA NECK FINDINGS SKELETON: Grade 1 anterolisthesis at C4-5. OTHER NECK: Normal pharynx, larynx and major salivary glands. No cervical lymphadenopathy. Unremarkable thyroid gland. UPPER CHEST: No pneumothorax or pleural effusion. No nodules or masses. AORTIC ARCH: There is mild calcific atherosclerosis of the aortic arch. There is no aneurysm, dissection or hemodynamically significant stenosis of the visualized portion of the aorta. Conventional 3 vessel aortic branching pattern. The visualized proximal subclavian arteries are widely patent. RIGHT CAROTID SYSTEM: No dissection, occlusion or aneurysm. Mild atherosclerotic calcification at the carotid bifurcation without hemodynamically significant stenosis. LEFT CAROTID SYSTEM: No dissection, occlusion or aneurysm. Mild atherosclerotic calcification at the carotid bifurcation without hemodynamically significant stenosis. VERTEBRAL ARTERIES: Right dominant configuration. The left vertebral artery is non-opacified at multiple locations of the V1 segment and proximal V2 segment. The distal V4 segment is also poorly opacified, but this might be partially due to poor contrast bolus. There is multifocal mild atherosclerosis of the right vertebral artery. CTA HEAD FINDINGS POSTERIOR CIRCULATION:  --Vertebral arteries: Non opacification of the distal V4 segment of the left vertebral artery. Moderate atherosclerotic narrowing of the right V4 segment. --Posterior inferior cerebellar arteries (PICA): Patent origins from the vertebral arteries. --Anterior inferior cerebellar arteries (AICA): Patent origins from the basilar artery. --Basilar artery: Normal. --Superior cerebellar arteries: Normal. --Posterior cerebral arteries: Normal. Both originate from the basilar artery. Posterior communicating arteries (p-comm) are diminutive or absent. ANTERIOR CIRCULATION: --Intracranial internal carotid arteries: Normal. --Anterior cerebral arteries (ACA): Normal. Both A1 segments are present. Patent anterior communicating artery (a-comm). --Middle cerebral arteries (MCA): Normal. VENOUS SINUSES: As permitted by contrast timing, patent. ANATOMIC VARIANTS: None Review of the MIP images confirms the above findings. CT Brain Perfusion Findings: ASPECTS: 10 CBF (<30%) Volume: 0mL Perfusion (Tmax>6.0s) volume: 6mL Mismatch Volume: 6mL Infarction Location:None Note: The calculated area of ischemia is likely artifactual and appears to correspond to the CSF space in the posterior fossa. IMPRESSION: 1. No emergent large vessel occlusion or high-grade stenosis of the intracranial arteries. 2. Occlusion of the left vertebral artery at multiple locations of the V1 segment and proximal V2 segment, with poor opacification of the distal V4 segment. This is likely secondary to atherosclerotic disease and exaggerated by a relatively poor contrast bolus. 3. No acute infarct by CT perfusion analysis. Small region of calculated penumbra in the left posterior fossa  is considered artifactual. 4. Aortic Atherosclerosis (ICD10-I70.0). Electronically Signed   By: Ulyses Jarred M.D.   On: 10/26/2019 03:23   ECHOCARDIOGRAM COMPLETE  Result Date: 10/27/2019    ECHOCARDIOGRAM REPORT   Patient Name:   Jordan Chang Date of Exam: 10/26/2019 Medical  Rec #:  [id number]       Height:       71.0 in Accession #:    [id number]      Weight:       283.3 lb Date of Birth:  [birth year]      BSA:          2.445 m Patient Age:    33 years        BP:           142/75 mmHg Patient Gender: M               HR:           92 bpm. Exam Location:  [facility] Procedure: 2D Echo and Intracardiac Opacification Agent Indications:     Stroke 434.91/ I163.9  History:         Patient has no prior history of Echocardiogram examinations.  Sonographer:     Arville Go RDCS Referring Phys:  [id number] Athena Masse Diagnosing Phys: Bartholome Bill MD  Sonographer Comments: Technically challenging study due to limited acoustic windows, Technically difficult study due to poor echo windows and patient is morbidly obese. Image acquisition challenging due to patient body habitus. IMPRESSIONS  1. Left ventricular ejection fraction, by estimation, is 60 to 65%. The left ventricle has normal function. The left ventricle has no regional wall motion abnormalities. There is mild left ventricular hypertrophy. Left ventricular diastolic parameters are consistent with Grade I diastolic dysfunction (impaired relaxation).  2. Right ventricular systolic function is normal. The right ventricular size is normal.  3. The mitral valve is degenerative. Trivial mitral valve regurgitation. No evidence of mitral stenosis.  4. The aortic valve was not well visualized. Aortic valve regurgitation is trivial.  5. Aortic dilatation noted. FINDINGS  Left Ventricle: Left ventricular ejection fraction, by estimation, is 60 to 65%. The left ventricle has normal function. The left ventricle has no regional wall motion abnormalities. Definity contrast agent was given IV to delineate the left ventricular  endocardial borders. The left ventricular internal cavity size was normal in size. There is mild left ventricular hypertrophy. Left ventricular diastolic parameters are consistent with Grade I diastolic dysfunction (impaired  relaxation). Right Ventricle: The right ventricular size is normal. No increase in right ventricular wall thickness. Right ventricular systolic function is normal. Left Atrium: Left atrial size was normal in size. Right Atrium: Right atrial size was normal in size. Pericardium: There is no evidence of pericardial effusion. Mitral Valve: The mitral valve is degenerative in appearance. Trivial mitral valve regurgitation. No evidence of mitral valve stenosis. MV peak gradient, 11.3 mmHg. The mean mitral valve gradient is 5.0 mmHg. Tricuspid Valve: The tricuspid valve is not well visualized. Tricuspid valve regurgitation is trivial. Aortic Valve: The aortic valve was not well visualized. Aortic valve regurgitation is trivial. Aortic valve peak gradient measures 5.1 mmHg. Pulmonic Valve: The pulmonic valve was not well visualized. Pulmonic valve regurgitation is not visualized. Aorta: Aortic dilatation noted. IAS/Shunts: The interatrial septum was not assessed.  LEFT VENTRICLE PLAX 2D LVIDd:         3.55 cm      Diastology LVIDs:         2.51 cm  LV e' lateral:   8.27 cm/s LV PW:         1.19 cm      LV E/e' lateral: 14.8 LV IVS:        1.58 cm      LV e' medial:    3.81 cm/s LVOT diam:     2.20 cm      LV E/e' medial:  32.0 LV SV:         55 LV SV Index:   22 LVOT Area:     3.80 cm  LV Volumes (MOD) LV vol d, MOD A2C: 96.3 ml LV vol d, MOD A4C: 133.0 ml LV vol s, MOD A2C: 38.5 ml LV vol s, MOD A4C: 41.5 ml LV SV MOD A2C:     57.8 ml LV SV MOD A4C:     133.0 ml LV SV MOD BP:      75.9 ml RIGHT VENTRICLE RV Basal diam:  3.02 cm RV S prime:     10.40 cm/s TAPSE (M-mode): 1.9 cm LEFT ATRIUM             Index LA diam:        3.60 cm 1.47 cm/m LA Vol (A2C):   24.0 ml 9.82 ml/m LA Vol (A4C):   59.6 ml 24.38 ml/m LA Biplane Vol: 40.5 ml 16.57 ml/m  AORTIC VALVE AV Area (Vmax): 2.58 cm AV Vmax:        113.00 cm/s AV Peak Grad:   5.1 mmHg LVOT Vmax:      76.60 cm/s LVOT Vmean:     47.500 cm/s LVOT VTI:       0.144 m   AORTA Ao Root diam: 3.60 cm MITRAL VALVE MV Area (PHT): 4.21 cm     SHUNTS MV Peak grad:  11.3 mmHg    Systemic VTI:  0.14 m MV Mean grad:  5.0 mmHg     Systemic Diam: 2.20 cm MV Vmax:       1.68 m/s MV Vmean:      99.9 cm/s MV Decel Time: 180 msec MV E velocity: 122.00 cm/s MV A velocity: 171.00 cm/s MV E/A ratio:  0.71 Harold Hedge MD Electronically signed by Harold Hedge MD Signature Date/Time: 10/27/2019/7:18:59 AM    Final    CT HEAD CODE STROKE WO CONTRAST`  Result Date: 10/26/2019 CLINICAL DATA:  Code stroke.  Slurred speech and right side weakness EXAM: CT HEAD WITHOUT CONTRAST TECHNIQUE: Contiguous axial images were obtained from the base of the skull through the vertex without intravenous contrast. COMPARISON:  None. FINDINGS: Brain: There is no mass, hemorrhage or extra-axial collection. The size and configuration of the ventricles and extra-axial CSF spaces are normal. There is hypoattenuation of the periventricular white matter, most commonly indicating chronic ischemic microangiopathy. Old right cerebellar infarct. Vascular: Atherosclerotic calcification of the vertebral and internal carotid arteries at the skull base. No abnormal hyperdensity of the major intracranial arteries or dural venous sinuses. Skull: The visualized skull base, calvarium and extracranial soft tissues are normal. Sinuses/Orbits: No fluid levels or advanced mucosal thickening of the visualized paranasal sinuses. No mastoid or middle ear effusion. The orbits are normal. ASPECTS [facility] Stroke Program Early CT Score) - Ganglionic level infarction (caudate, lentiform nuclei, internal capsule, insula, M1-M3 cortex): 7 - Supraganglionic infarction (M4-M6 cortex): 3 Total score (0-10 with 10 being normal): 10 IMPRESSION: 1. No acute intracranial abnormality. 2. ASPECTS is 10. 3. 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 * These results were called  by telephone at the time of interpretation on 10/26/2019 at  2:14 am to provider [facility] , who verbally acknowledged these results. Electronically Signed   By: Deatra Robinson M.D.   On: 10/26/2019 02:14    Labs:  Basic Metabolic Panel: Recent Labs  Lab 11/09/19 0713 11/12/19 0957  NA 139 140  K 4.5 4.2  CL 105 105  CO2 26 24  GLUCOSE 118* 87  BUN 21 25*  CREATININE 1.15 1.35*  CALCIUM 9.6 9.3    CBC: No results for input(s): WBC, NEUTROABS, HGB, HCT, MCV, PLT in the last 168 hours.  CBG: Recent Labs  Lab 11/11/19 2109 11/12/19 0603 11/12/19 1152 11/12/19 1625 11/12/19 2105  GLUCAP 100* 106* 83 135* 80   Family history.  Mother and father with history of hypertension as well as hyperlipidemia.  Denies any colon cancer esophageal cancer or rectal cancer  Brief HPI:   Jordan Chang is a 79 y.o. right-handed male with history of hypertension, morbid obesity, diabetes mellitus, chronic back pain with lumbar laminectomy decompression, hyperlipidemia.  Per chart review lives with spouse independent with a straight cane prior to admission.  1 level home 3 steps to entry.  Presented to [facility]26/2019 with slurred speech and right-sided weakness.  Admission chemistries with alcohol negative, glucose 136, BUN 27, creatinine 1.20, urine drug screen negative.  Cranial CT scan negative for acute changes.  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  CT angiogram of head and neck no emergent large vessel occlusion or high-grade stenosis.  MRI of the brain showed few small acute to subacute infarcts in multiple vascular territories.  Chronic microvascular ischemic changes.  Patient did not receive TPA.  Echocardiogram with ejection fraction of 65% grade 1 diastolic dysfunction.  Lower extremity Dopplers negative for DVT.  Maintained on aspirin and Plavix for CVA prophylaxis.  Subcutaneous Lovenox for DVT prophylaxis.  Therapy evaluations completed and patient was admitted for a comprehensive rehab program   Hospital  Course: Jordan Chang was admitted to rehab 10/28/2019 for inpatient therapies to consist of PT, ST and OT at least three hours five days a week. Past admission physiatrist, therapy team and rehab RN have worked together to provide customized collaborative inpatient rehab.  Pertaining to patient's acute to subacute infarct multiple vascular territories involving bilateral brain parenchymal left pons inferior right cerebellum left frontal lobe and right parietal lobe remained stable he continued on aspirin and Plavix therapy he would follow-up neurology services.  Chronic pain management he continued on Neurontin scheduled as well as Robaxin as needed for muscle spasms.  Blood sugars controlled hemoglobin A1c of 6.6 diabetic teaching he would follow-up with his primary MD.  Permissive hypertension blood pressure monitored and he continued on low-dose Lasix.  He had been on Cozaar 50 mg daily as well as Maxide prior to admission this could be resumed as needed as close monitoring for permissive hypertension after CVA.  Lipitor ongoing for hyperlipidemia.  BPH he continued on Flomax and was able to empty his bladder.  He did have a gout flareup placed on low-dose colchicine completed a course of prednisone without recurrence.  Morbid obesity BMI 39.51 with dietary follow-up.   Blood pressures were monitored on TID basis and controlled  Diabetes has been monitored with ac/hs CBG checks and SSI was use prn for tighter BS control.   He/ is continent of bowel and bladder.  He/ has made gains during rehab stay and is attending therapies  He/  will continue to receive follow up therapies   after discharge  Rehab course: During patient's stay in rehab weekly team conferences were held to monitor patient's progress, set goals and discuss barriers to discharge. At admission, patient required minimal guard sit to stand min mod assist ambulate 38 feet rolling walker  Physical exam.  Blood pressure 160/77 pulse 80  temperature 98.6 respirations 17 oxygen saturation 96% room air Constitutional alert and oriented a bit hard of hearing no acute distress HEENT Head.  Normocephalic and atraumatic Neck.  Supple nontender no JVD without thyromegaly Eyes.  Pupils round and reactive to light no discharge.nystagmus Cardiac regular rate rhythm without any extra sounds or murmur heard Respiratory.  Effort normal no respiratory distress without wheeze Abdomen.  Soft nontender positive bowel sounds without rebound Musculoskeletal.  Normal range of motion.  Right ankle dorsum of foot a bit erythematous Right upper extremity deltoid 3/5, biceps 4+/5, triceps 3 -/5, wrist extension 3 -/5, grip 3 -/5, finger abduction 3 -/5 Left upper extremity 5 -/5 in same muscles Right lower extremity hip flexors 3 -/5, knee extension/knee flexion 3/5, dorsiflexion 1/5 pain inhibition plantar flexion 2/5 Left lower extremity 5 -/5 in same muscles   He/  has had improvement in activity tolerance, balance, postural control as well as ability to compensate for deficits. He/ has had improvement in functional use RUE/LUE  and RLE/LLE as well as improvement in awareness.  Performed STS from recliner supervision ambulates to the rehab gym with rolling walker and supervision.  Patient participated with ascending and descending 12 steps with right rail only.  Participated in STS no assistive device.  Participated in obstacle course with rolling walker weaving through cones and stepping over hockey-stick.  He can ambulate back to his room with contact-guard assist.  Patient able to have a bowel movement and urinate independently perform hygiene while standing.  He was stand at the sink side for hand hygiene and contact-guard assist.  Contact-guard assist for lower body ADLs.  He was able to communicate his needs.  Full family teaching completed and plan discharge to home.       Disposition: Discharge to home    Diet: Diabetic diet  Special  Instructions: No driving smoking or alcohol  Follow-up with primary MD on resuming antihypertensive medications after CVA  Medications at discharge 1.  Tylenol as needed 2.  Aspirin 81 mg p.o. daily 3.  Lipitor 40 mg p.o. daily 4.  Plavix 75 mg p.o. daily 5.  Colchicine 0.6 mg p.o. twice daily 6.  Lasix 20 mg p.o. daily 7.  Neurontin 600 mg p.o. 3 times daily 8.  Claritin 10 mg p.o. daily 9.  Robaxin 500 mg every 8 hours as needed muscle spasms 10.  Flomax 0.4 mg daily  30-35 minutes were spent completing discharge summary and discharge planning  Discharge Instructions    Ambulatory referral to Neurology   Complete by: As directed    An appointment is requested in approximately 4 weeks acute subacute multiple infarct vascular territories left pons, inferior right cerebellum, left frontal and right parietal lobe.   Ambulatory referral to Physical Medicine Rehab   Complete by: As directed    Moderate complexity follow-up 1 to 2 weeks left pontine infarction      Follow-up Information    Kirsteins, Victorino Sparrow, MD Follow up.   Specialty: Physical Medicine and Rehabilitation Why: Office to call for appointment Contact information: [address] [place] [address] [phone]  Alwyn Peaallwood, Dwayne D, MD Follow up.   Specialties: Cardiology, Internal Medicine Why: call for appointment Contact information: [address] [place] [address] [phone]           Signed: Charlton AmorDaniel J Geraldene Eisel 11/13/2019, 5:01 AM

## 2019-11-11 NOTE — Progress Notes (Signed)
Patient ID: Jordan Chang, male   DOB: December 26, 1940, 79 y.o.   MRN: [id number]  Patient shower chair ordered

## 2019-11-11 NOTE — Progress Notes (Signed)
Physical Therapy Session Note  Patient Details  Name: Jordan Chang MRN: [id number] Date of Birth: 03-25-1941  Today's Date: 11/11/2019 PT Individual Time: 0950-1105 and 1445-1530 PT Individual Time Calculation (min): 75 min and 45 min  Short Term Goals: Week 2:  PT Short Term Goal 1 (Week 2): STG=LTG  Skilled Therapeutic Interventions/Progress Updates: Tx1:Pt presented in recliner agreeable to therapy. Performed STS from recliner supervision and ambulated to rehab gym with supervision overall. Continued progression of stair training with pt consistently ascending/descending x 12 steps with R rail only ascending forwards and descending backwards with CGA. Pt noted to consistently place R foot on step appropriately when descending thi session. Pt ambulated to ADL apt and demonstrated bed mobility sit to/from supine and rolling L/R with mod I and increased time. Pt propelled w/c to day room using 4 x extremities supervision overall for general conditioning. Participated in several bouts of corn hole with x 2 bout son level tile without AD for forced use of RUE with pt demonstrating increased velocity of tossing bag with repetitions and pt able to reach high/low and across midline with moderate challenges. Performed x 2 bouts on Airex with RW support requiring CGA and intermittent verbal cues due to slight posterior lean. Pt ambulated to NuStep with RW and supervision and participated in L6 x 10 min for cardiovascular conditioning with pt maintaining avg 80 SPM. Performed an additional x 2 min BLE only for strengthening.   Tx2: Pt presented in recliner agreeable to therapy. Pt able to tolerate donning shoes this session with modA from PTA. Pt ambulated to nsg station with supervision overall and then transported remaining distance to [facility] entrance for energy conservation. Pt ambulated in courtyard bouts of 100-256f with RW and CGA improving to supervision. Pt noted to have some decreased R foot  clearance when becoming fatigued but was able to correct with cues. Discussed energy conservation techniques and importance of monitoring fatigue levels for safety initially upon d/c. [facility] with CGA due to slight incline and decreased feet clearance. Pt transported back to room and performed ambulatory transfer to recliner. Pt left in recliner at end of session requesting to keep shoes on with seat alarm on, call bell within reach and needs met.      Therapy Documentation Precautions:  Precautions Precautions: Fall Restrictions Weight Bearing Restrictions: No General:   Vital Signs:   Pain: Pain Assessment Pain Scale: 0-10 Pain Score: 0-No pain    Therapy/Group: Individual Therapy  Markos Theil  Yazmyne Sara, PTA  11/11/2019, 12:29 PM

## 2019-11-12 ENCOUNTER — Inpatient Hospital Stay (HOSPITAL_COMMUNITY): Payer: PPO | Admitting: Physical Therapy

## 2019-11-12 ENCOUNTER — Inpatient Hospital Stay (HOSPITAL_COMMUNITY): Payer: PPO | Admitting: Occupational Therapy

## 2019-11-12 ENCOUNTER — Inpatient Hospital Stay (HOSPITAL_COMMUNITY): Payer: PPO

## 2019-11-12 LAB — GLUCOSE, CAPILLARY
Glucose-Capillary: 106 mg/dL — ABNORMAL HIGH (ref 70–99)
Glucose-Capillary: 83 mg/dL (ref 70–99)
Glucose-Capillary: 135 mg/dL — ABNORMAL HIGH (ref 70–99)
Glucose-Capillary: 82 mg/dL (ref 70–99)

## 2019-11-12 LAB — BASIC METABOLIC PANEL
Anion gap: 11 (ref 5–15)
BUN: 25 mg/dL — ABNORMAL HIGH (ref 8–23)
CO2: 24 mmol/L (ref 22–32)
Calcium: 9.3 mg/dL (ref 8.9–10.3)
Chloride: 105 mmol/L (ref 98–111)
Creatinine, Ser: 1.35 mg/dL — ABNORMAL HIGH (ref 0.61–1.24)
GFR calc Af Amer: 57 mL/min — ABNORMAL LOW (ref >60)
GFR calc non Af Amer: 49 mL/min — ABNORMAL LOW (ref >60)
Glucose, Bld: 87 mg/dL (ref 70–99)
Potassium: 4.2 mmol/L (ref 3.5–5.1)
Sodium: 140 mmol/L (ref 135–145)

## 2019-11-12 MED ORDER — PANTOPRAZOLE SODIUM 40 MG PO TBEC: 40.0000 mg | DELAYED_RELEASE_TABLET | Freq: Two times a day (BID) | ORAL | 0 refills | Status: DC

## 2019-11-12 MED ORDER — FUROSEMIDE 20 MG PO TABS: 20.0000 mg | ORAL_TABLET | Freq: Every day | ORAL | 1 refills | Status: DC

## 2019-11-12 MED ORDER — ACETAMINOPHEN 325 MG PO TABS: 650.0000 mg | ORAL_TABLET | ORAL | Status: AC | PRN

## 2019-11-12 MED ORDER — COLCHICINE 0.6 MG PO TABS: 0.6000 mg | ORAL_TABLET | Freq: Two times a day (BID) | ORAL | 0 refills | Status: DC

## 2019-11-12 MED ORDER — CLOPIDOGREL BISULFATE 75 MG PO TABS: 75.0000 mg | ORAL_TABLET | Freq: Every day | ORAL | 0 refills | Status: AC

## 2019-11-12 MED ORDER — TAMSULOSIN HCL 0.4 MG PO CAPS: 0.4000 mg | ORAL_CAPSULE | Freq: Every day | ORAL | 1 refills | Status: DC

## 2019-11-12 MED ORDER — VITAMIN B-12 1000 MCG PO TABS: 1000.0000 ug | ORAL_TABLET | Freq: Every day | ORAL | 0 refills | Status: DC

## 2019-11-12 MED ORDER — ATORVASTATIN CALCIUM 40 MG PO TABS: 40.0000 mg | ORAL_TABLET | ORAL | 0 refills | Status: AC

## 2019-11-12 MED ORDER — LORATADINE 10 MG PO TABS: 10.0000 mg | ORAL_TABLET | Freq: Every day | ORAL | 0 refills | Status: DC

## 2019-11-12 MED ORDER — METHOCARBAMOL 500 MG PO TABS: 500.0000 mg | ORAL_TABLET | Freq: Three times a day (TID) | ORAL | 0 refills | Status: DC | PRN

## 2019-11-12 MED ORDER — VITAMIN D-3 25 MCG (1000 UT) PO CAPS: 1000.0000 [IU] | ORAL_CAPSULE | Freq: Two times a day (BID) | ORAL | 0 refills | Status: DC

## 2019-11-12 NOTE — Progress Notes (Signed)
Occupational Therapy Discharge Summary  Patient Details  Name: Jordan Chang MRN: [id number] Date of Birth: Nov 15, 1940  Today's Date: 11/12/2019 OT Individual Time: 0700-0755 OT Individual Time Calculation (min): 55 min    Patient has met 11 of 12 long term goals due to improved activity tolerance, improved balance, postural control, ability to compensate for deficits, functional use of  RIGHT upper and RIGHT lower extremity, improved attention, improved awareness, improved coordination and improved pain in R LE.  Patient to discharge at overall Supervision level.  Patient's care partner is independent to provide the necessary supervision assistance at discharge.    Reasons goals not met: Pt needs min A to don B socks and shoes  Recommendation:  Patient will benefit from ongoing skilled OT services in home health setting to continue to advance functional skills in the area of BADL and iADL.  Equipment: shower seat  Reasons for discharge: treatment goals met  Patient/family agrees with progress made and goals achieved: Yes   OT Intervention: Upon entering the room, pt seated in wheelchair with no c/o pain this session. Pt requesting to shower this session. Pt ambulating with supervision and to bathroom with RW for toileting needs. Pt doffing clothing items while seated on commode and then transferring to shower chair for bathing tasks at supervision level. Pt exiting the shower and transferring to recliner chair to don clothing items at supervision level. PT needing assistance to don B TED hose and socks. Pt standing at sink with supervision for grooming tasks with no LOB. Pt given 9 hole peg rest with average of 34.8 seconds this session. That is 40 seconds less that last week. Great improvement. L UE average was 29 seconds. Pt remained seated in recliner chair at end of session with no further concerns at this time.   OT Discharge Precautions/Restrictions  Restrictions Weight Bearing  Restrictions: No General   Vital Signsstart  Therapy Vitals Temp: 98.1 F (36.7 C) Pulse Rate: 100 Resp: 20 BP: 139/77 Patient Position (if appropriate): Standing Oxygen Therapy SpO2: 96 % O2 Device: Room Air Pain Pain Assessment Pain Scale: 0-10 Pain Score: 0-No pain Vision Baseline Vision/History: Wears glasses Wears Glasses: Reading only Patient Visual Report: No change from baseline Vision Assessment?: No apparent visual deficits Perception  Perception: Within Functional Limits Praxis Praxis: Intact Cognition Orientation Level: Oriented X4 Sensation Sensation Light Touch: Appears Intact Hot/Cold: Appears Intact Proprioception: Appears Intact Stereognosis: Not tested Coordination Gross Motor Movements are Fluid and Coordinated: No Fine Motor Movements are Fluid and Coordinated: No Coordination and Movement Description: mild R sided impairments, but greatly imrpoved from [employer] Test: mild dysmetria Motor  Motor Motor: Hemiplegia Motor - Discharge Observations: R hemi.plegia; greatly improved from eval Mobility  Bed Mobility Bed Mobility: Rolling Right;Rolling Left;Supine to Sit;Sit to Supine Rolling Right: Independent Rolling Left: Independent Supine to Sit: Independent Sit to Supine: Independent Transfers Sit to Stand: Supervision/Verbal cueing  Trunk/Postural Assessment  Cervical Assessment Cervical Assessment: Exceptions to WFL(forward head) Thoracic Assessment Thoracic Assessment: Exceptions to WFL(rounded shoulder) Lumbar Assessment Lumbar Assessment: Exceptions to WFL(posterior pelvic tilt) Postural Control Postural Control: Within Functional Limits  Balance Balance Balance Assessed: Yes Standardized Balance Assessment Standardized Balance Assessment: Timed Up and Go Test Timed Up and Go Test TUG: Normal TUG Normal TUG (seconds): 27.5 Static Sitting Balance Static Sitting - Balance Support: Bilateral upper extremity supported;Feet  supported Static Sitting - Level of Assistance: 6: Modified independent (Device/Increase time) Dynamic Sitting Balance Dynamic Sitting - Balance Support: No upper extremity  supported;Feet supported;During functional activity Dynamic Sitting - Level of Assistance: 6: Modified independent (Device/Increase time) Static Standing Balance Static Standing - Balance Support: During functional activity;Left upper extremity supported Static Standing - Level of Assistance: 5: Stand by assistance Dynamic Standing Balance Dynamic Standing - Balance Support: Bilateral upper extremity supported Dynamic Standing - Level of Assistance: 5: Stand by assistance Extremity/Trunk Assessment RUE Assessment RUE Assessment: Within Functional Limits Passive Range of Motion (PROM) Comments: WFLs Active Range of Motion (AROM) Comments: WFLs General Strength Comments: 3+/5 throughout LUE Assessment LUE Assessment: Within Functional Limits   Gypsy Decant 11/12/2019, 9:12 AM

## 2019-11-12 NOTE — Progress Notes (Signed)
Physical Therapy Discharge Summary  Patient Details  Name: Jordan Chang MRN: [id number] Date of Birth: 05/02/1941  Today's Date: 11/12/2019 PT Individual Time: 1030-1150 PT Individual Time Calculation (min): 80 min    Patient has met 8 of 8 long term goals due to improved activity tolerance, improved balance, improved postural control, increased strength, decreased pain, ability to compensate for deficits, functional use of  right upper extremity and right lower extremity, improved awareness and improved coordination.  Patient to discharge at an ambulatory level Supervision.   Patient's care partner is independent to provide the necessary physical assistance at discharge.  Reasons goals not met: all PT goals met   Recommendation:  Patient will benefit from ongoing skilled PT services in home health setting to continue to advance safe functional mobility, address ongoing impairments in balance, strength, stairs, gait, safety, coordination, and minimize fall risk.  Equipment: No equipment provided  Reasons for discharge: treatment goals met and discharge from hospital  Patient/family agrees with progress made and goals achieved: Yes    PT treatment:  Pt received sitting in WC and agreeable to PT. Gait training with RW x 128f with distant supervision assist. One near LOB due to foot drag on the R, but able to correct without cues. Pt performed 5xSTS: 21 sec with no hands (>15 sec indicates increased fall risk). Stair management training with 1 rail R to simulate home environment x 12 steps, CGA-min assist for last 3 steps due to fatigue. Car transfer training with supervision assist and RW with cues for safety and proper UE support on stable surfaces.  PT instructed pt in TUG: 27.5 sec (average of 3 trials; >13.5 sec indicates increased fall risk). WC mobility x 1660fwithout cues or assist from PT; pt able to manage doorway with increased time and maintain straight path through hallway  without cues. Nustep reciproca movement training x 10 minutes, level 6, BLE only x 5 min and BUE/BLE x 5 min. Cues for full ROM on the R intermittently. Patient returned to room and performed stand pivot to recliner with RW and supervision assist. . Pt left sitting in recliner with call bell in reach and all needs met.        PT Discharge Precautions/Restrictions   fall  Pain    deines Vision/Perception  Perception Perception: Within Functional Limits Praxis Praxis: Intact  Cognition Orientation Level: Oriented X4 Problem Solving: Impaired Problem Solving Impairment: Functional complex Behaviors: Impulsive Comments: mild impulsivity, but improved from Eval Sensation Sensation Light Touch: Appears Intact Hot/Cold: Appears Intact Proprioception: Appears Intact Coordination Gross Motor Movements are Fluid and Coordinated: No Fine Motor Movements are Fluid and Coordinated: No Coordination and Movement Description: mild R sided impairments, but greatly imrpoved from Eval Heel Shin Test: mild dysmetria Motor  Motor Motor: Hemiplegia Motor - Discharge Observations: R hemi.plegia; greatly improved from eval  Mobility Bed Mobility Bed Mobility: Rolling Right;Rolling Left;Supine to Sit;Sit to Supine Rolling Right: Independent Rolling Left: Independent Supine to Sit: Independent Sit to Supine: Independent Transfers Transfers: Sit to Stand;Stand Pivot Transfers Sit to Stand: Supervision/Verbal cueing Stand Pivot Transfers: Supervision/Verbal cueing Transfer (Assistive device): Rolling walker Locomotion  Gait Ambulation: Yes Gait Assistance: Supervision/Verbal cueing Gait Distance (Feet): 180 Feet Assistive device: Rolling walker Gait Assistance Details: Verbal cues for gait pattern;Verbal cues for safe use of DME/AE Gait Gait: Yes Gait Pattern: Impaired Gait Pattern: Right flexed knee in stance;Poor foot clearance - right Gait velocity: decreased Stairs / Additional  Locomotion Stairs: Yes Stairs Assistance: Contact  Guard/Touching assist Stair Management Technique: One rail Right Number of Stairs: 12 Height of Stairs: 6 Wheelchair Mobility Wheelchair Mobility: Yes Wheelchair Assistance: Chartered loss adjuster: Both upper extremities;Left lower extremity Wheelchair Parts Management: Supervision/cueing Distance: 150  Trunk/Postural Assessment  Cervical Assessment Cervical Assessment: Exceptions to WFL(forward head) Thoracic Assessment Thoracic Assessment: Exceptions to WFL(rounded shoulders) Lumbar Assessment Lumbar Assessment: Exceptions to WFL(posterior pelvic tilt) Postural Control Postural Control: Within Functional Limits  Balance Balance Balance Assessed: Yes Standardized Balance Assessment Standardized Balance Assessment: Timed Up and Go Test Timed Up and Go Test TUG: Normal TUG Normal TUG (seconds): 27.5 Static Sitting Balance Static Sitting - Balance Support: Bilateral upper extremity supported;Feet supported Static Sitting - Level of Assistance: 6: Modified independent (Device/Increase time) Dynamic Sitting Balance Dynamic Sitting - Balance Support: No upper extremity supported;Feet supported;During functional activity Dynamic Sitting - Level of Assistance: 6: Modified independent (Device/Increase time) Static Standing Balance Static Standing - Balance Support: During functional activity;Left upper extremity supported Static Standing - Level of Assistance: 5: Stand by assistance Dynamic Standing Balance Dynamic Standing - Balance Support: Bilateral upper extremity supported Dynamic Standing - Level of Assistance: 5: Stand by assistance Extremity Assessment      RLE Assessment RLE Assessment: Exceptions to [facility] General Strength Comments: impaired, see below RLE Strength Right Hip Flexion: 4/5 Right Knee Flexion: 4/5 Right Knee Extension: 4+/5 Right Ankle Dorsiflexion: 4/5(painful) LLE  Assessment LLE Assessment: Within Functional Limits General Strength Comments: 5/5 grossly, 4/5 PF    Lorie Phenix 11/12/2019, 11:29 AM

## 2019-11-12 NOTE — Progress Notes (Signed)
Physical Therapy Session Note  Patient Details  Name: Jordan Chang MRN: [id number] Date of Birth: April 17, 1941  Today's Date: 11/12/2019 PT Individual Time: 0900-0930 PT Individual Time Calculation (min): 30 min   Short Term Goals: Week 2:  PT Short Term Goal 1 (Week 2): STG=LTG  Skilled Therapeutic Interventions/Progress Updates:  Pt presented in recliner agreeable to therapy. Pt denies pain during session. Pt ambulated to rehab gym with supervision and RW. After seated rest participated in ascending/descending x 12 steps with R rail and improved clearance of R foot to step this session. Pt also participated in step ups to 3 in step with B rails x 10 for RLE strengthening. Participated in STS with 1Kg weighted ball with shoulder flexion 2 x 10 from elevated mat for BLE strengthening and forced use of RUE. Pt ambulated back to room with supervision and noted decreased R foot clearance with fatigue. Pt returned to room at end of session and returned to recliner with seat alarm on, call bell within reach and needs met.      Therapy Documentation Precautions:  Precautions Precautions: Fall Restrictions Weight Bearing Restrictions: No General:   Vital Signs: Therapy Vitals Temp: 98.1 F (36.7 C) Pulse Rate: 100 Resp: 20 BP: 139/77 Patient Position (if appropriate): Standing Oxygen Therapy SpO2: 96 % O2 Device: Room Air    Therapy/Group: Individual Therapy  Javayah Magaw  Anjani Feuerborn, PTA  11/12/2019, 9:42 AM

## 2019-11-12 NOTE — Progress Notes (Signed)
Speech Language Pathology Discharge Summary  Patient Details  Name: Jordan Chang MRN: [id number] Date of Birth: 05-Jun-1941  Today's Date: 11/12/2019 SLP Individual Time: 9409-0502 SLP Individual Time Calculation (min): 41 min   Skilled Therapeutic Interventions:  Skilled ST services focused on education and cognitive skills. SLP reassessed cognitive skills with subsections fo Cognistat, pt demonstrated WFL on delayed recall, judgment and mild impairment in construction task. Pt continues to demonstrated mild delayed processing and awareness of errors in complex task, however much improved since evaluation. SLP provided education to pt and pt's wife pertaining need for assistance with complex problem solving task such as medication management to assess for errors. All questions answered to satisfaction. Pt was left with wife and chair alarm set.     Patient has met 3 of 3 long term goals.  Patient to discharge at overall Supervision level.  Reasons goals not met:     Clinical Impression/Discharge Summary:   Pt made great progress meeting 3 out 3 goals discharging at supervision A. Pt demonstrated improvement in memory, anticipatory awareness and mild to complex problem solving skills. SLP facilitated growth in these areas listed above with functional task such as medication/time management and carryover of strategies in novel tasks.Education was completed with pt and pt's wife. Pt benefited from skilled ST services to maximize functional independence and reduce burden of care, requiring supervision A and continued ST services for complex problem solving for a short period of time.   Care Partner:  Caregiver Able to Provide Assistance: Yes  Type of Caregiver Assistance: Physical;Cognitive  Recommendation:  24 hour supervision/assistance;Home Health SLP  Rationale for SLP Follow Up: Maximize cognitive function and independence;Reduce caregiver burden   Equipment: N/A   Reasons for  discharge: Discharged from hospital   Patient/Family Agrees with Progress Made and Goals Achieved: Yes    Edelmiro Innocent  [name] 11/12/2019, 3:49 PM

## 2019-11-12 NOTE — Patient Care Conference (Signed)
Inpatient RehabilitationTeam Conference and Plan of Care Update Date: 11/12/2019   Time: 1:39 PM    Patient Name: Jordan Chang      Medical Record Number: [id number]  Date of Birth: Sep 22, 1940 Sex: Male         Room/Bed: 4W25C/4W25C-01 Payor Info: Payor: [employer] / Plan: [employer] Must PPO / Product Type: *No Product type* /    Admit Date/Time:  10/28/2019 12:19 PM  Primary Diagnosis:  Left pontine cerebrovascular accident [facility])  Patient Active Problem List   Diagnosis Date Noted  . Hyperlipidemia 10/28/2019  . Left pontine cerebrovascular accident ([place]) 10/28/2019  . Acute CVA (cerebrovascular accident) ([place]) 10/26/2019  . Type 2 diabetes mellitus with other specified complication ([place]) 12/45/8099  . Essential hypertension 10/26/2019  . Chronic back pain 01/22/2016  . Lumbar herniated disc 01/20/2016    Expected Discharge Date: Expected Discharge Date: 11/13/19  Team Members Present: Physician leading conference: Dr. Alysia Penna Care Coodinator Present: Nestor Lewandowsky, RN, BSN, CRRN;Christina Sampson Goon, [place] Nurse Present: Mohammed Kindle, RN PT Present: Phylliss Bob, PTA OT Present: Darleen Crocker, OT SLP Present: Charolett Bumpers, SLP PPS Coordinator present : Gunnar Fusi, SLP     Current Status/Progress Goal Weekly Team Focus  Bowel/Bladder   Patient remains continent of bowel and bladder. LBM 11/09/2019  Patient will maintain continence  Assess bowel sounds and output Qshift. Assist with toileting needs PRN.   Swallow/Nutrition/ Hydration             ADL's   supervision overall with UB self care, toileting, bathing, and functional transfers with use of RW. Min A for LB dressing  supervision overall  R NMR, pt/family edu, ADL retraining, functional transfers, endurance, d/c planning   Mobility   supervision transfer, supervision gait with RW 155ft, supervision stairs R rail  CGA transfers, supervision gait, minA stairs  BLE strengthening,  standing balance, endurance, stair training   Communication             Safety/Cognition/ Behavioral Observations  Supervison A  Supervision A  education and reassessment. Follow up ST would be benefical since he plans on driving again   Pain   Patient c/o intermittent arm and shoulder pain, headache. Relieved by Tylenol PRN, repositioning, environmental changes  Patient will maintain level less than 4 with activity tolerance  Pain assessment Qshift and PRN   Skin   Patient's skin remains intact. Lotion and barrier cream applied to dry, patchy areas  Patient will maintain skin integrity and be free of breakdown and infection.  Assess skin Qshift and PRN.    Rehab Goals Patient on target to meet rehab goals: Yes Rehab Goals Revised: on target with current goals *See Care Plan and progress notes for long and short-term goals.     Barriers to Discharge  Current Status/Progress Possible Resolutions Date Resolved   Nursing                  PT                    OT                  SLP                Care Coordinator Medical stability              Discharge Planning/Teaching Needs:  Goal to discharge home  Will schedule if recommended   Team Discussion:  On target to meet goals  and ready for discharge tomorrow. Family education was completed over the weekend and patient is able to manage stairs with 1 rail at supervision to [employer] which the family can provide  Revisions to Treatment Plan:      Medical Summary Current Status: LE swelling lasix restarted    Barriers to Discharge: Other (comments)  Barriers to Discharge Comments: Rechecking labs Possible Resolutions to Barriers: DC in am   Continued Need for Acute Rehabilitation Level of Care: The patient requires daily medical management by a physician with specialized training in physical medicine and rehabilitation for the following reasons: Direction of a multidisciplinary physical rehabilitation program to maximize  functional independence : Yes Medical management of patient stability for increased activity during participation in an intensive rehabilitation regime.: Yes Analysis of laboratory values and/or radiology reports with any subsequent need for medication adjustment and/or medical intervention. : Yes   I attest that I was present, lead the team conference, and concur with the assessment and plan of the team.   Chana Bode B 11/12/2019, 1:39 PM

## 2019-11-12 NOTE — Progress Notes (Signed)
[place] PHYSICAL MEDICINE & REHABILITATION PROGRESS NOTE   Subjective/Complaints:  No issues overnite pt without c/o Up/down 12 steps with PT Has 3 steps at home   ROS: Patient denies CP, SOB, N/V/D    Objective:   No results found. No results for input(s): WBC, HGB, HCT, PLT in the last 72 hours. No results for input(s): NA, K, CL, CO2, GLUCOSE, BUN, CREATININE, CALCIUM in the last 72 hours.  Intake/Output Summary (Last 24 hours) at 11/12/2019 0926 Last data filed at 11/12/2019 0315 Gross per 24 hour  Intake 256 ml  Output 600 ml  Net -344 ml     Physical Exam: Vital Signs Blood pressure 139/77, pulse 100, temperature 98.1 F (36.7 C), resp. rate 20, height 5' 11"  (1.803 m), weight 129.1 kg, SpO2 96 %.   General: No acute distress Mood and affect are appropriate Heart: Regular rate and rhythm no rubs murmurs or extra sounds Lungs: Clear to auscultation, breathing unlabored, no rales or wheezes Abdomen: Positive bowel sounds, soft nontender to palpation, nondistended Extremities: No clubbing, cyanosis, or edema Skin: No evidence of breakdown, no evidence of rash  Neurologic: Cranial nerves II through XII intact, motor strength is 5/5 in left and 4- to 4/5 Right  deltoid, bicep, tricep, grip, hip flexor, knee extensors, ankle dorsiflexor and plantar flexor Sensation WNL  BUE and BLE Musculoskeletal:full ROM, no pain  Assessment/Plan: 1. Functional deficits secondary to bilateral CVA which require 3+ hours per day of interdisciplinary therapy in a comprehensive inpatient rehab setting.  Physiatrist is providing close team supervision and 24 hour management of active medical problems listed below.  Physiatrist and rehab team continue to assess barriers to discharge/monitor patient progress toward functional and medical goals  Care Tool:  Bathing  Bathing activity did not occur: Safety/medical concerns Body parts bathed by patient: Right arm, Chest, Abdomen, Right  upper leg, Left upper leg, Face, Front perineal area, Left arm, Right lower leg, Left lower leg, Buttocks   Body parts bathed by helper: Right lower leg, Left lower leg     Bathing assist Assist Level: Supervision/Verbal cueing     Upper Body Dressing/Undressing Upper body dressing Upper body dressing/undressing activity did not occur (including orthotics): Refused What is the patient wearing?: Pull over shirt    Upper body assist Assist Level: Supervision/Verbal cueing    Lower Body Dressing/Undressing Lower body dressing      What is the patient wearing?: Underwear/pull up, Pants     Lower body assist Assist for lower body dressing: Supervision/Verbal cueing     Toileting Toileting    Toileting assist Assist for toileting: Supervision/Verbal cueing Assistive Device Comment: Urinal   Transfers Chair/bed transfer  Transfers assist  Chair/bed transfer activity did not occur: Safety/medical concerns  Chair/bed transfer assist level: Supervision/Verbal cueing     Locomotion Ambulation   Ambulation assist   Ambulation activity did not occur: Safety/medical concerns  Assist level: Supervision/Verbal cueing Assistive device: Walker-rolling Max distance: 160f   Walk 10 feet activity   Assist  Walk 10 feet activity did not occur: Safety/medical concerns  Assist level: Supervision/Verbal cueing Assistive device: Walker-rolling   Walk 50 feet activity   Assist Walk 50 feet with 2 turns activity did not occur: Safety/medical concerns  Assist level: Supervision/Verbal cueing Assistive device: Walker-rolling    Walk 150 feet activity   Assist Walk 150 feet activity did not occur: Safety/medical concerns  Assist level: Supervision/Verbal cueing Assistive device: Walker-rolling    Walk 10 feet on uneven  surface  activity   Assist Walk 10 feet on uneven surfaces activity did not occur: Safety/medical concerns         Wheelchair     Assist  Will patient use wheelchair at discharge?: No Type of Wheelchair: Manual    Wheelchair assist level: Supervision/Verbal cueing Max wheelchair distance: 182f    Wheelchair 50 feet with 2 turns activity    Assist        Assist Level: Supervision/Verbal cueing   Wheelchair 150 feet activity     Assist      Assist Level: Supervision/Verbal cueing   Blood pressure 139/77, pulse 100, temperature 98.1 F (36.7 C), resp. rate 20, height 5' 11"  (1.803 m), weight 129.1 kg, SpO2 96 %.  Medical Problem List and Plan: 1.  Right-sided weakness with dysarthria secondary to acute to subacute infarct multiple vascular territories involving bilateral brain parenchyma(left pons, inferior right cerebellum, left frontal lobe and right parietal lobe).  Small chronic right cerebellar infarct             -patient may  shower             -ELOS/Goals: tent D/C 5/20 goals Mod I to supervision  -Continue CIR Team conference today please see physician documentation under team conference tab, met with team  to discuss problems,progress, and goals. Formulized individual treatment plan based on medical history, underlying problem and comorbidities. 2.  Antithrombotics: -DVT/anticoagulation: with ambulation             -antiplatelet therapy: Aspirin 81 mg daily, Plavix 75 mg daily 3. Pain Management: Neurontin 600 mg 3 times daily,  Flexeril discontinued secondary to urinary retention switch to methocarbamol. Well controlled 4. Mood: Provide emotional support             -antipsychotic agents: N/A 5. Neuropsych: This patient is capable of making decisions on his own behalf. 6. Skin/Wound Care: Routine skin checks 7. Fluids/Electrolytes/Nutrition: Routine in and outs with follow-up chemistries  5/16: stable  8.  Diabetes mellitus with peripheral neuropathy.  Hemoglobin A1c 6.6.  SSI. CBG (last 3)  Recent Labs    11/11/19 1651 11/11/19 2109 11/12/19 0603  GLUCAP 89 100* 106*  Controlled 5/18  at 102 9.  Permissive hypertension.  Monitor with increased mobility.  Patient on Cozaar 50 mg daily, Maxide 37.5-25 mg daily prior to admission.  Resume as needed Vitals:   11/11/19 1948 11/12/19 0555  BP: (!) 147/75 139/77  Pulse: 65 100  Resp: 20 20  Temp: 98.1 F (36.7 C) 98.1 F (36.7 C)  SpO2: 99% 96%  Controlled off antihypertensive medications 5/19 10.  Hyperlipidemia.  Lipitor 11. Gout flare- pt has had before- will start Colchicine 0.6 mg BID off Prednisone without recurrence   12.  Hx BPH flomax restarted, flexeril stopped  5/17 seems to be emptying bladder well now.  13. Lower extremity edema: Restarted Lasix 250mdaily.    5/16: Cr stable and Lasix helping. Can continue dose.   14. Disposition: Given LE edema and risk factors, he should follow with Dr. CaClayborn Bignesshom his wife sees.   Pt states he passed a clot with urination and since then has been able to void LOS: 15 days A FACE TO FASevery Jordan Chang 11/12/2019, 9:26 AM

## 2019-11-12 NOTE — Progress Notes (Signed)
Team Conference Report to Patient/Family  Team Conference discussion was reviewed with the patient and caregiver, including goals, any changes in plan of care and target discharge date.  Patient and caregiver express understanding and are in agreement.  The patient has a target discharge date of 11/13/19.  [name] 11/12/2019, 2:39 PM Patient ID: Jordan Chang, male   DOB: 1940/07/20, 79 y.o.   MRN: [id number]

## 2019-11-13 LAB — GLUCOSE, CAPILLARY: Glucose-Capillary: 102 mg/dL — ABNORMAL HIGH (ref 70–99)

## 2019-11-13 NOTE — Progress Notes (Signed)
[place] PHYSICAL MEDICINE & REHABILITATION PROGRESS NOTE   Subjective/Complaints:  Feels ready for d/c  ROS: Patient denies CP, SOB, N/V/D    Objective:   No results found. No results for input(s): WBC, HGB, HCT, PLT in the last 72 hours. Recent Labs    11/12/19 0957  NA 140  K 4.2  CL 105  CO2 24  GLUCOSE 87  BUN 25*  CREATININE 1.35*  CALCIUM 9.3    Intake/Output Summary (Last 24 hours) at 11/13/2019 0855 Last data filed at 11/13/2019 0842 Gross per 24 hour  Intake 1320 ml  Output 600 ml  Net 720 ml     Physical Exam: Vital Signs Blood pressure 132/61, pulse 68, temperature 97.6 F (36.4 C), resp. rate 18, height 5\' 11"  (1.803 m), weight 129.1 kg, SpO2 97 %.    General: No acute distress Mood and affect are appropriate Heart: Regular rate and rhythm no rubs murmurs or extra sounds Lungs: Clear to auscultation, breathing unlabored, no rales or wheezes Abdomen: Positive bowel sounds, soft nontender to palpation, nondistended Extremities: No clubbing, cyanosis, or edema Skin: No evidence of breakdown, no evidence of rash   Neurologic: Cranial nerves II through XII intact, motor strength is 5/5 in left and 4- to 4/5 Right  deltoid, bicep, tricep, grip, hip flexor, knee extensors, ankle dorsiflexor and plantar flexor Sensation WNL  BUE and BLE Musculoskeletal:full ROM, no pain  Assessment/Plan: 1. Functional deficits secondary to bilateral CVA Stable for D/C today F/u PCP in 3-4 weeks F/u PM&R 2 weeks See D/C summary See D/C instructions Care Tool:  Bathing  Bathing activity did not occur: Safety/medical concerns Body parts bathed by patient: Right arm, Chest, Abdomen, Right upper leg, Left upper leg, Face, Front perineal area, Left arm, Right lower leg, Left lower leg, Buttocks   Body parts bathed by helper: Right lower leg, Left lower leg     Bathing assist Assist Level: Supervision/Verbal cueing     Upper Body Dressing/Undressing Upper body  dressing Upper body dressing/undressing activity did not occur (including orthotics): Refused What is the patient wearing?: Pull over shirt    Upper body assist Assist Level: Supervision/Verbal cueing    Lower Body Dressing/Undressing Lower body dressing      What is the patient wearing?: Underwear/pull up, Pants     Lower body assist Assist for lower body dressing: Supervision/Verbal cueing     Toileting Toileting    Toileting assist Assist for toileting: Supervision/Verbal cueing Assistive Device Comment: Urinal   Transfers Chair/bed transfer  Transfers assist  Chair/bed transfer activity did not occur: Safety/medical concerns  Chair/bed transfer assist level: Supervision/Verbal cueing     Locomotion Ambulation   Ambulation assist   Ambulation activity did not occur: Safety/medical concerns  Assist level: Supervision/Verbal cueing Assistive device: Walker-rolling Max distance: 150   Walk 10 feet activity   Assist  Walk 10 feet activity did not occur: Safety/medical concerns  Assist level: Supervision/Verbal cueing Assistive device: Walker-rolling   Walk 50 feet activity   Assist Walk 50 feet with 2 turns activity did not occur: Safety/medical concerns  Assist level: Supervision/Verbal cueing Assistive device: Walker-rolling    Walk 150 feet activity   Assist Walk 150 feet activity did not occur: Safety/medical concerns  Assist level: Supervision/Verbal cueing Assistive device: Walker-rolling    Walk 10 feet on uneven surface  activity   Assist Walk 10 feet on uneven surfaces activity did not occur: Safety/medical concerns   Assist level: Supervision/Verbal cueing Assistive device: Walker-rolling  Wheelchair     Assist Will patient use wheelchair at discharge?: No Type of Wheelchair: Manual    Wheelchair assist level: Supervision/Verbal cueing Max wheelchair distance: 144ft    Wheelchair 50 feet with 2 turns  activity    Assist        Assist Level: Supervision/Verbal cueing   Wheelchair 150 feet activity     Assist      Assist Level: Supervision/Verbal cueing   Blood pressure 132/61, pulse 68, temperature 97.6 F (36.4 C), resp. rate 18, height 5\' 11"  (1.803 m), weight 129.1 kg, SpO2 97 %.  Medical Problem List and Plan: 1.  Right-sided weakness with dysarthria secondary to acute to subacute infarct multiple vascular territories involving bilateral brain parenchyma(left pons, inferior right cerebellum, left frontal lobe and right parietal lobe).  Small chronic right cerebellar infarct             -patient may  shower             -ELOS:  D/C 5/20  2.  Antithrombotics: -DVT/anticoagulation: with ambulation             -antiplatelet therapy: Aspirin 81 mg daily, Plavix 75 mg daily 3. Pain Management: Neurontin 600 mg 3 times daily,  Flexeril discontinued secondary to urinary retention switch to methocarbamol. Well controlled 4. Mood: Provide emotional support             -antipsychotic agents: N/A 5. Neuropsych: This patient is capable of making decisions on his own behalf. 6. Skin/Wound Care: Routine skin checks 7. Fluids/Electrolytes/Nutrition: Routine in and outs with follow-up chemistries  5/16: stable  8.  Diabetes mellitus with peripheral neuropathy.  Hemoglobin A1c 6.6.  SSI. CBG (last 3)  Recent Labs    11/12/19 1625 11/12/19 2105 11/13/19 0623  GLUCAP 135* 82 102*  Controlled 5/20 at 102 9.  Permissive hypertension.  Monitor with increased mobility.  Patient on Cozaar 50 mg daily, Maxide 37.5-25 mg daily prior to admission.  Resume as needed Vitals:   11/12/19 2052 11/13/19 0539  BP: (!) 144/67 132/61  Pulse: 61 68  Resp: 18 18  Temp: 98.4 F (36.9 C) 97.6 F (36.4 C)  SpO2: 98% 97%  controlled  5/20 lasix 20mg  per day  10.  Hyperlipidemia.  Lipitor 11. Gout flare- pt has had before- will start Colchicine 0.6 mg BID off Prednisone without recurrence    12.  Hx BPH flomax restarted, flexeril stopped  5/17 seems to be emptying bladder well now.  13. Lower extremity edema: Restarted Lasix 20mg  daily.    5/16: Cr stable and Lasix helping. Can continue dose.   14. Disposition: Given LE edema and risk factors, he should follow with Dr. 6/17 whom his wife sees.   Pt states he passed a clot with urination and since then has been able to void LOS: 16 days A FACE TO FACE EVALUATION WAS PERFORMED  11/13/2019, 8:55 AM

## 2019-11-13 NOTE — Progress Notes (Signed)
Pt was D/C from unit. Pt left with all personal belongings and all questions were answered. Pt states he will attend all follow-up appts. Pt left the unit with wife. Reuben Likes LPN

## 2019-11-13 NOTE — Progress Notes (Signed)
Inpatient Rehab Care Coordinator Discharge Note Inpatient Rehabilitation Care Coordinator  Discharge Note  The overall goal for the admission was met for:   Discharge location: Yes  Length of Stay: Yes  Discharge activity level: Yes  Home/community participation: Yes  Services provided included: MD, RD, PT, OT, SLP, RN, CM, TR, Pharmacy and SW  Financial Services: Medicare and Other: HTA  Follow-up services arranged: Home Health: [facility]  Comments (or additional information): PT OT ST  Patient/Family verbalized understanding of follow-up arrangements: Yes  Individual responsible for coordination of the follow-up plan: Arbie Cookey [phone]  Confirmed correct DME delivered: Dyanne Iha 11/13/2019    Dyanne Iha

## 2019-11-17 ENCOUNTER — Telehealth: Payer: Self-pay

## 2019-11-17 ENCOUNTER — Telehealth: Payer: Self-pay | Admitting: Registered Nurse

## 2019-11-17 NOTE — Telephone Encounter (Signed)
This SW covering for primary SW [employer] from patient's wife [name] [phone]) stating that PT/OT had not started.   SW spoke with Britney/[facility] HH liaison [phone]) to inform on patient concerns. States that she spoke with the [facility] branch today and was informed that there would be contact made with the patient with regard to home visit. States she will call [facility] branch again.   SW called pt wife [name] to follow-up about her concerns. She reported that she just got off the phone with someone from [facility] who stated there will be follow-up about scheduling.   SW received call from [place] NP with [facility] inquiring about above. SW provided above updates.

## 2019-11-17 NOTE — Telephone Encounter (Signed)
Transitional Care call Transitional Questions answered by [name]  Patient name: Jordan Chang DOB: 1941-01-27 1. Are you/is patient experiencing any problems since coming home? No a. Are there any questions regarding any aspect of care? No 2. Are there any questions regarding medications administration/dosing? No a. Are meds being taken as prescribed? Yes b. "Patient should review meds with caller to confirm" Medication List Reviewed. 3. Have there been any falls? No 4. Has Home Health been to the house and/or have they contacted you? Yes, This provider spoke with Auria she reports she was in contact with [facility] and they will be calling [name] this evening. This was discussed with [name], she verbalizes understanding.  a. If not, have you tried to contact them? NA b. Can we help you contact them? No 5. Are bowels and bladder emptying properly? Yes a. Are there any unexpected incontinence issues? No b. If applicable, is patient following bowel/bladder programs? NA 6. Any fevers, problems with breathing, unexpected pain? No 7. Are there any skin problems or new areas of breakdown? No 8. Has the patient/family member arranged specialty MD follow up (ie cardiology/neurology/renal/surgical/etc.)?  No: [name] was instructed to call [facility] and Dr Juliann Pares to schedule HFU appointments. She verbalizes understanding.  a. Can we help arrange? No 9. Does the patient need any other services or support that we can help arrange? No 10. Are caregivers following through as expected in assisting the patient? Yes 11. Has the patient quit smoking, drinking alcohol, or using drugs as recommended? (                        )  Appointment date/time 11/27/2019  arrival time 2:00 for 2:15 appointment with Dr Wynn Banker. At  [address] [facility] [address]

## 2019-11-18 ENCOUNTER — Telehealth: Payer: Self-pay

## 2019-11-18 DIAGNOSIS — G8929 Other chronic pain: Secondary | ICD-10-CM | POA: Diagnosis not present

## 2019-11-18 DIAGNOSIS — Z7982 Long term (current) use of aspirin: Secondary | ICD-10-CM | POA: Diagnosis not present

## 2019-11-18 DIAGNOSIS — I69322 Dysarthria following cerebral infarction: Secondary | ICD-10-CM | POA: Diagnosis not present

## 2019-11-18 DIAGNOSIS — E785 Hyperlipidemia, unspecified: Secondary | ICD-10-CM | POA: Diagnosis not present

## 2019-11-18 DIAGNOSIS — I69351 Hemiplegia and hemiparesis following cerebral infarction affecting right dominant side: Secondary | ICD-10-CM | POA: Diagnosis not present

## 2019-11-18 DIAGNOSIS — I10 Essential (primary) hypertension: Secondary | ICD-10-CM | POA: Diagnosis not present

## 2019-11-18 DIAGNOSIS — E1140 Type 2 diabetes mellitus with diabetic neuropathy, unspecified: Secondary | ICD-10-CM | POA: Diagnosis not present

## 2019-11-18 DIAGNOSIS — M109 Gout, unspecified: Secondary | ICD-10-CM | POA: Diagnosis not present

## 2019-11-18 DIAGNOSIS — K219 Gastro-esophageal reflux disease without esophagitis: Secondary | ICD-10-CM | POA: Diagnosis not present

## 2019-11-18 DIAGNOSIS — N39498 Other specified urinary incontinence: Secondary | ICD-10-CM | POA: Diagnosis not present

## 2019-11-18 DIAGNOSIS — N401 Enlarged prostate with lower urinary tract symptoms: Secondary | ICD-10-CM | POA: Diagnosis not present

## 2019-11-18 DIAGNOSIS — I69328 Other speech and language deficits following cerebral infarction: Secondary | ICD-10-CM | POA: Diagnosis not present

## 2019-11-18 DIAGNOSIS — M549 Dorsalgia, unspecified: Secondary | ICD-10-CM | POA: Diagnosis not present

## 2019-11-18 DIAGNOSIS — Z6839 Body mass index (BMI) 39.0-39.9, adult: Secondary | ICD-10-CM | POA: Diagnosis not present

## 2019-11-18 DIAGNOSIS — Z7902 Long term (current) use of antithrombotics/antiplatelets: Secondary | ICD-10-CM | POA: Diagnosis not present

## 2019-11-18 DIAGNOSIS — Z9181 History of falling: Secondary | ICD-10-CM | POA: Diagnosis not present

## 2019-11-18 NOTE — Telephone Encounter (Signed)
Patients wife called [phone])  Regarding timing on appt spoke to [place] - doesn't matter called wife back to advise

## 2019-11-20 ENCOUNTER — Encounter: Payer: Self-pay | Admitting: *Deleted

## 2019-11-20 ENCOUNTER — Telehealth: Payer: Self-pay | Admitting: *Deleted

## 2019-11-20 NOTE — Telephone Encounter (Signed)
Patient enrolled for [employer] to ship a 30 day cardiac event monitor to his home. Letter with instructions mailed to patient. 

## 2019-11-21 ENCOUNTER — Telehealth: Payer: Self-pay

## 2019-11-21 NOTE — Telephone Encounter (Signed)
I spoke with Jordan Chang.  It looks like Jordan Chang has put a note in that says Jordan Chang is enrolled in a PREVENTICE 30 day heart monitor and a letter with information has been mailed to their home.  I do not see who Jordan Chang is connected to, so I explained that Jordan Chang just needs to wait until the letter arrives with more information.  She understands. Jordan Chang does not see his cardiologist until late June.

## 2019-11-21 NOTE — Telephone Encounter (Signed)
[name] [name] called regarding why he needs heart monitor please return call [phone]

## 2019-11-27 ENCOUNTER — Encounter: Payer: Self-pay | Admitting: Physical Medicine & Rehabilitation

## 2019-11-27 ENCOUNTER — Encounter: Payer: PPO | Attending: Physical Medicine & Rehabilitation | Admitting: Physical Medicine & Rehabilitation

## 2019-11-27 ENCOUNTER — Other Ambulatory Visit: Payer: Self-pay

## 2019-11-27 VITALS — BP 156/83 | HR 76 | Temp 98.5°F | Ht 71.0 in | Wt 271.0 lb

## 2019-11-27 DIAGNOSIS — I6350 Cerebral infarction due to unspecified occlusion or stenosis of unspecified cerebral artery: Secondary | ICD-10-CM | POA: Insufficient documentation

## 2019-11-27 DIAGNOSIS — I69351 Hemiplegia and hemiparesis following cerebral infarction affecting right dominant side: Secondary | ICD-10-CM | POA: Insufficient documentation

## 2019-11-27 DIAGNOSIS — I639 Cerebral infarction, unspecified: Secondary | ICD-10-CM

## 2019-11-27 NOTE — Patient Instructions (Addendum)
No driving  Keep up with Home exercise program   Keep taking Aspirin and Plavix

## 2019-11-27 NOTE — Progress Notes (Signed)
Subjective:  Transitional care phone call performed 5/2 09/2019  Patient ID: Jordan Chang, male    DOB: January 12, 1941, 79 y.o.   MRN: [id number] 79 year old right-handed male with history of hypertension, morbid obesity with BMI 39.51, diabetes mellitus with neuropathy, chronic back pain with lumbar laminectomy decompression 01/20/2016, hyperlipidemia.  Per chart review patient lives with spouse.  Independent with a straight point cane.  1 level home 3 steps to entry.  Family assistance as needed.  Presented to [facility] 10/26/2019 with slurred speech and right-sided weakness.  Admission chemistries with alcohol negative, glucose 136, BUN 27, creatinine 1.20, urine drug screen negative.  Cranial CT scan negative for acute changes.  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  CT angiogram of head and neck no emergent large vessel occlusion or high-grade stenosis.  MRI of the brain showed few small acute to subacute infarcts multiple vascular territories.  Chronic microvascular ischemic changes.  Patient did not receive TPA.  Echocardiogram with ejection fraction of 65%.  Grade 1 diastolic dysfunction.  Lower extremity Doppler negative for DVT.  Currently on aspirin and Plavix for CVA prophylaxis.  Subcutaneous Lovenox for DVT prophylaxis.  Tolerating a regular diet.  Therapy evaluations completed and patient was admitted for a comprehensive rehab program.   Pt c/o gout Sx's in R foot/ankle- on top of foot- very painful since last night.  LBM Saturday- feels constipated.  Voiding OK.  Likes to talk a lot, per pt and wife.        Admit date: 10/28/2019 Discharge date: 11/13/2019  HPI  79 year old male who follows up after inpatient stroke rehabilitation stay approximately 2 weeks ago.  Overall the patient is doing fairly well at home with his wife's assistance. Wife had questions regarding his heart monitor and these were forwarded to cardiology.  The patient has not seen his doctor at  the [place] thus far. 4 steps going up front with handrail, has been able to perform this several times now  No falls at home Urinary burning improved after increasing water intake.  No fevers HHPT, SLP, have both started.  According to the wife she thinks his speech has improved quite dramatically.  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  Mod I dressing and uses shower chair  Mod I toileting, ambulates using a rolling walker. Pain Inventory Average Pain 0 Pain Right Now 0 My pain is na  In the last 24 hours, has pain interfered with the following? General activity 0 Relation with others 0 Enjoyment of life 0 What TIME of day is your pain at its worst? na Sleep (in general) Good  Pain is worse with: na Pain improves with: na Relief from Meds: na  Mobility walk with assistance use a walker how many minutes can you walk? 15 ability to climb steps?  yes do you drive?  yes  Function retired  Neuro/Psych tremor  Prior Studies TC appt  Physicians involved in your care TC appt   History reviewed. No pertinent family history. Social History   Socioeconomic History  . Marital status: Married    Spouse name: Not on file  . Number of children: Not on file  . Years of education: Not on file  . Highest education level: Not on file  Occupational History  . Not on file  Tobacco Use  . Smoking status: Never Smoker  . Smokeless tobacco: Never Used  Substance and Sexual Activity  . Alcohol use: Not Currently  . Drug use: No  . Sexual activity: Not Currently  Other Topics Concern  . Not on file  Social History Narrative  . Not on file   Social Determinants of Health   Financial Resource Strain:   . Difficulty of Paying Living Expenses:   Food Insecurity:   . Worried  About Charity fundraiser in the Last Year:   . Arboriculturist in the Last Year:   Transportation Needs:   . Film/video editor (Medical):   Marland Kitchen Lack of Transportation (Non-Medical):   Physical Activity:   . Days of Exercise per Week:   . Minutes of Exercise per Session:   Stress:   . Feeling of Stress :   Social Connections:   . Frequency of Communication with Friends and Family:   . Frequency of Social Gatherings with Friends and Family:   . Attends Religious Services:   . Active Member of Clubs or Organizations:   . Attends Archivist Meetings:   Marland Kitchen Marital Status:    Past Surgical History:  Procedure Laterality Date  . BACK SURGERY  05/2011  . EYE SURGERY     cataracts  . LUMBAR LAMINECTOMY/DECOMPRESSION MICRODISCECTOMY Left 01/20/2016   Procedure: Left Lumbar two-three, Lumbar three-four,  Lumbar four-five Microdiskectomy;  Surgeon: Newman Pies, MD;  Location: [facility];  Service: Neurosurgery;  Laterality: Left;   Past Medical History:  Diagnosis Date  . GERD (gastroesophageal reflux disease)    occ  . Hyperlipemia   . Hypertension    BP (!) 156/83   Pulse 76   Temp 98.5 F (36.9 C)   Ht 5\' 11"  (1.803 m)   Wt 271 lb (122.9 kg)   SpO2 98%   BMI 37.80 kg/m   Opioid Risk Score:   Fall Risk Score:  `1  Depression screen PHQ 2/9  Depression screen PHQ 2/9 11/27/2019  Decreased Interest 0  Down, Depressed, Hopeless 0  PHQ - 2 Score 0    Review of Systems  Neurological: Positive for tremors.  All other systems reviewed and are negative.      Objective:   Physical Exam Vitals and nursing note reviewed.  Constitutional:      Appearance: He is obese.  HENT:     Head: Normocephalic and atraumatic.  Eyes:     Extraocular Movements: Extraocular movements intact.     Conjunctiva/sclera: Conjunctivae normal.     Pupils: Pupils are equal, round, and reactive to light.  Cardiovascular:     Rate and Rhythm: Normal rate and regular rhythm.      Heart sounds: Normal heart sounds. No murmur.  Pulmonary:     Effort: Pulmonary effort is normal. No respiratory distress.     Breath sounds: Normal breath sounds. No wheezing.  Abdominal:     General: Abdomen is flat. Bowel sounds are normal. There is no distension.     Palpations: Abdomen is soft.  Neurological:     Mental Status: He is alert.    Motor strength is 4/5 in the right deltoid bicep tricep grip hip flexor knee extensor ankle dorsiflexor 5/5 in the left deltoid bicep tricep grip hip flexor knee extensor ankle dorsiflexor Extremities no clubbing cyanosis, 1+ right lower extremity and 2+ left lower extremity edema which wife states is chronic Ambulates with rolling walker no evidence of  toe drag or knee instability.  Slow cadence short step length widened base of support      Assessment & Plan:  #1.  Left pontine infarct with right hemiparesis, strength and dysarthria have been improving.  He continues require home health therapy PT OT and speech. We discussed driving and the patient does not feel ready yet and I agree with him. I will see him back in rehab follow-up in 1 month, likely will need to transition to outpatient therapy at that time  2.  Stroke prophylaxis continue aspirin and Plavix has neurology follow-up in July  #3.  Stroke etiology, follow-up with cardiology for external monitoring device   4.  Hyperlipidemia hypertension and type 2 diabetes for stroke risk factors for small vessel disease, follow-up with primary care physician at the [facility]

## 2019-12-03 ENCOUNTER — Ambulatory Visit (INDEPENDENT_AMBULATORY_CARE_PROVIDER_SITE_OTHER): Payer: PPO

## 2019-12-03 DIAGNOSIS — Z8673 Personal history of transient ischemic attack (TIA), and cerebral infarction without residual deficits: Secondary | ICD-10-CM | POA: Diagnosis not present

## 2019-12-03 DIAGNOSIS — E1165 Type 2 diabetes mellitus with hyperglycemia: Secondary | ICD-10-CM | POA: Diagnosis not present

## 2019-12-03 DIAGNOSIS — N401 Enlarged prostate with lower urinary tract symptoms: Secondary | ICD-10-CM | POA: Diagnosis not present

## 2019-12-03 DIAGNOSIS — I639 Cerebral infarction, unspecified: Secondary | ICD-10-CM | POA: Diagnosis not present

## 2019-12-03 DIAGNOSIS — I69359 Hemiplegia and hemiparesis following cerebral infarction affecting unspecified side: Secondary | ICD-10-CM | POA: Diagnosis not present

## 2019-12-03 DIAGNOSIS — Z6838 Body mass index (BMI) 38.0-38.9, adult: Secondary | ICD-10-CM | POA: Diagnosis not present

## 2019-12-03 DIAGNOSIS — I10 Essential (primary) hypertension: Secondary | ICD-10-CM | POA: Diagnosis not present

## 2019-12-03 DIAGNOSIS — I4891 Unspecified atrial fibrillation: Secondary | ICD-10-CM

## 2019-12-03 DIAGNOSIS — H9193 Unspecified hearing loss, bilateral: Secondary | ICD-10-CM | POA: Diagnosis not present

## 2019-12-03 DIAGNOSIS — Z09 Encounter for follow-up examination after completed treatment for conditions other than malignant neoplasm: Secondary | ICD-10-CM | POA: Diagnosis not present

## 2019-12-04 DIAGNOSIS — E785 Hyperlipidemia, unspecified: Secondary | ICD-10-CM | POA: Diagnosis not present

## 2019-12-04 DIAGNOSIS — Z7982 Long term (current) use of aspirin: Secondary | ICD-10-CM | POA: Diagnosis not present

## 2019-12-04 DIAGNOSIS — N401 Enlarged prostate with lower urinary tract symptoms: Secondary | ICD-10-CM | POA: Diagnosis not present

## 2019-12-04 DIAGNOSIS — I69322 Dysarthria following cerebral infarction: Secondary | ICD-10-CM | POA: Diagnosis not present

## 2019-12-04 DIAGNOSIS — M549 Dorsalgia, unspecified: Secondary | ICD-10-CM | POA: Diagnosis not present

## 2019-12-04 DIAGNOSIS — Z7902 Long term (current) use of antithrombotics/antiplatelets: Secondary | ICD-10-CM | POA: Diagnosis not present

## 2019-12-04 DIAGNOSIS — I10 Essential (primary) hypertension: Secondary | ICD-10-CM | POA: Diagnosis not present

## 2019-12-04 DIAGNOSIS — E1140 Type 2 diabetes mellitus with diabetic neuropathy, unspecified: Secondary | ICD-10-CM | POA: Diagnosis not present

## 2019-12-04 DIAGNOSIS — M109 Gout, unspecified: Secondary | ICD-10-CM | POA: Diagnosis not present

## 2019-12-04 DIAGNOSIS — K219 Gastro-esophageal reflux disease without esophagitis: Secondary | ICD-10-CM | POA: Diagnosis not present

## 2019-12-04 DIAGNOSIS — Z9181 History of falling: Secondary | ICD-10-CM | POA: Diagnosis not present

## 2019-12-04 DIAGNOSIS — I69351 Hemiplegia and hemiparesis following cerebral infarction affecting right dominant side: Secondary | ICD-10-CM | POA: Diagnosis not present

## 2019-12-04 DIAGNOSIS — N39498 Other specified urinary incontinence: Secondary | ICD-10-CM | POA: Diagnosis not present

## 2019-12-04 DIAGNOSIS — I69328 Other speech and language deficits following cerebral infarction: Secondary | ICD-10-CM | POA: Diagnosis not present

## 2019-12-04 DIAGNOSIS — Z6839 Body mass index (BMI) 39.0-39.9, adult: Secondary | ICD-10-CM | POA: Diagnosis not present

## 2019-12-04 DIAGNOSIS — G8929 Other chronic pain: Secondary | ICD-10-CM | POA: Diagnosis not present

## 2019-12-08 DIAGNOSIS — M109 Gout, unspecified: Secondary | ICD-10-CM | POA: Diagnosis not present

## 2019-12-08 DIAGNOSIS — Z7982 Long term (current) use of aspirin: Secondary | ICD-10-CM | POA: Diagnosis not present

## 2019-12-08 DIAGNOSIS — Z9181 History of falling: Secondary | ICD-10-CM | POA: Diagnosis not present

## 2019-12-08 DIAGNOSIS — I69322 Dysarthria following cerebral infarction: Secondary | ICD-10-CM | POA: Diagnosis not present

## 2019-12-08 DIAGNOSIS — G8929 Other chronic pain: Secondary | ICD-10-CM | POA: Diagnosis not present

## 2019-12-08 DIAGNOSIS — E785 Hyperlipidemia, unspecified: Secondary | ICD-10-CM | POA: Diagnosis not present

## 2019-12-08 DIAGNOSIS — N401 Enlarged prostate with lower urinary tract symptoms: Secondary | ICD-10-CM | POA: Diagnosis not present

## 2019-12-08 DIAGNOSIS — K219 Gastro-esophageal reflux disease without esophagitis: Secondary | ICD-10-CM | POA: Diagnosis not present

## 2019-12-08 DIAGNOSIS — M549 Dorsalgia, unspecified: Secondary | ICD-10-CM | POA: Diagnosis not present

## 2019-12-08 DIAGNOSIS — Z6839 Body mass index (BMI) 39.0-39.9, adult: Secondary | ICD-10-CM | POA: Diagnosis not present

## 2019-12-08 DIAGNOSIS — I69328 Other speech and language deficits following cerebral infarction: Secondary | ICD-10-CM | POA: Diagnosis not present

## 2019-12-08 DIAGNOSIS — I69351 Hemiplegia and hemiparesis following cerebral infarction affecting right dominant side: Secondary | ICD-10-CM | POA: Diagnosis not present

## 2019-12-08 DIAGNOSIS — E1140 Type 2 diabetes mellitus with diabetic neuropathy, unspecified: Secondary | ICD-10-CM | POA: Diagnosis not present

## 2019-12-08 DIAGNOSIS — I10 Essential (primary) hypertension: Secondary | ICD-10-CM | POA: Diagnosis not present

## 2019-12-08 DIAGNOSIS — N39498 Other specified urinary incontinence: Secondary | ICD-10-CM | POA: Diagnosis not present

## 2019-12-08 DIAGNOSIS — Z7902 Long term (current) use of antithrombotics/antiplatelets: Secondary | ICD-10-CM | POA: Diagnosis not present

## 2019-12-09 DIAGNOSIS — G8929 Other chronic pain: Secondary | ICD-10-CM | POA: Diagnosis not present

## 2019-12-09 DIAGNOSIS — E1140 Type 2 diabetes mellitus with diabetic neuropathy, unspecified: Secondary | ICD-10-CM | POA: Diagnosis not present

## 2019-12-09 DIAGNOSIS — M549 Dorsalgia, unspecified: Secondary | ICD-10-CM | POA: Diagnosis not present

## 2019-12-09 DIAGNOSIS — I69322 Dysarthria following cerebral infarction: Secondary | ICD-10-CM | POA: Diagnosis not present

## 2019-12-09 DIAGNOSIS — I10 Essential (primary) hypertension: Secondary | ICD-10-CM | POA: Diagnosis not present

## 2019-12-09 DIAGNOSIS — I69351 Hemiplegia and hemiparesis following cerebral infarction affecting right dominant side: Secondary | ICD-10-CM | POA: Diagnosis not present

## 2019-12-09 DIAGNOSIS — I69328 Other speech and language deficits following cerebral infarction: Secondary | ICD-10-CM | POA: Diagnosis not present

## 2019-12-17 DIAGNOSIS — E785 Hyperlipidemia, unspecified: Secondary | ICD-10-CM | POA: Diagnosis not present

## 2019-12-17 DIAGNOSIS — K219 Gastro-esophageal reflux disease without esophagitis: Secondary | ICD-10-CM | POA: Diagnosis not present

## 2019-12-17 DIAGNOSIS — G8929 Other chronic pain: Secondary | ICD-10-CM | POA: Diagnosis not present

## 2019-12-17 DIAGNOSIS — Z6839 Body mass index (BMI) 39.0-39.9, adult: Secondary | ICD-10-CM | POA: Diagnosis not present

## 2019-12-17 DIAGNOSIS — N39498 Other specified urinary incontinence: Secondary | ICD-10-CM | POA: Diagnosis not present

## 2019-12-17 DIAGNOSIS — Z7982 Long term (current) use of aspirin: Secondary | ICD-10-CM | POA: Diagnosis not present

## 2019-12-17 DIAGNOSIS — Z7902 Long term (current) use of antithrombotics/antiplatelets: Secondary | ICD-10-CM | POA: Diagnosis not present

## 2019-12-17 DIAGNOSIS — I69322 Dysarthria following cerebral infarction: Secondary | ICD-10-CM | POA: Diagnosis not present

## 2019-12-17 DIAGNOSIS — I69351 Hemiplegia and hemiparesis following cerebral infarction affecting right dominant side: Secondary | ICD-10-CM | POA: Diagnosis not present

## 2019-12-17 DIAGNOSIS — M549 Dorsalgia, unspecified: Secondary | ICD-10-CM | POA: Diagnosis not present

## 2019-12-17 DIAGNOSIS — E1140 Type 2 diabetes mellitus with diabetic neuropathy, unspecified: Secondary | ICD-10-CM | POA: Diagnosis not present

## 2019-12-17 DIAGNOSIS — Z9181 History of falling: Secondary | ICD-10-CM | POA: Diagnosis not present

## 2019-12-17 DIAGNOSIS — I69328 Other speech and language deficits following cerebral infarction: Secondary | ICD-10-CM | POA: Diagnosis not present

## 2019-12-17 DIAGNOSIS — I10 Essential (primary) hypertension: Secondary | ICD-10-CM | POA: Diagnosis not present

## 2019-12-17 DIAGNOSIS — M109 Gout, unspecified: Secondary | ICD-10-CM | POA: Diagnosis not present

## 2019-12-17 DIAGNOSIS — N401 Enlarged prostate with lower urinary tract symptoms: Secondary | ICD-10-CM | POA: Diagnosis not present

## 2019-12-23 DIAGNOSIS — Z7902 Long term (current) use of antithrombotics/antiplatelets: Secondary | ICD-10-CM | POA: Diagnosis not present

## 2019-12-23 DIAGNOSIS — I10 Essential (primary) hypertension: Secondary | ICD-10-CM | POA: Diagnosis not present

## 2019-12-23 DIAGNOSIS — K219 Gastro-esophageal reflux disease without esophagitis: Secondary | ICD-10-CM | POA: Diagnosis not present

## 2019-12-23 DIAGNOSIS — E785 Hyperlipidemia, unspecified: Secondary | ICD-10-CM | POA: Diagnosis not present

## 2019-12-23 DIAGNOSIS — N401 Enlarged prostate with lower urinary tract symptoms: Secondary | ICD-10-CM | POA: Diagnosis not present

## 2019-12-23 DIAGNOSIS — E1140 Type 2 diabetes mellitus with diabetic neuropathy, unspecified: Secondary | ICD-10-CM | POA: Diagnosis not present

## 2019-12-23 DIAGNOSIS — I69351 Hemiplegia and hemiparesis following cerebral infarction affecting right dominant side: Secondary | ICD-10-CM | POA: Diagnosis not present

## 2019-12-23 DIAGNOSIS — N39498 Other specified urinary incontinence: Secondary | ICD-10-CM | POA: Diagnosis not present

## 2019-12-23 DIAGNOSIS — G8929 Other chronic pain: Secondary | ICD-10-CM | POA: Diagnosis not present

## 2019-12-23 DIAGNOSIS — Z7982 Long term (current) use of aspirin: Secondary | ICD-10-CM | POA: Diagnosis not present

## 2019-12-23 DIAGNOSIS — Z6839 Body mass index (BMI) 39.0-39.9, adult: Secondary | ICD-10-CM | POA: Diagnosis not present

## 2019-12-23 DIAGNOSIS — R8290 Unspecified abnormal findings in urine: Secondary | ICD-10-CM | POA: Diagnosis not present

## 2019-12-23 DIAGNOSIS — I69328 Other speech and language deficits following cerebral infarction: Secondary | ICD-10-CM | POA: Diagnosis not present

## 2019-12-23 DIAGNOSIS — R399 Unspecified symptoms and signs involving the genitourinary system: Secondary | ICD-10-CM | POA: Diagnosis not present

## 2019-12-23 DIAGNOSIS — M109 Gout, unspecified: Secondary | ICD-10-CM | POA: Diagnosis not present

## 2019-12-23 DIAGNOSIS — I69322 Dysarthria following cerebral infarction: Secondary | ICD-10-CM | POA: Diagnosis not present

## 2019-12-23 DIAGNOSIS — Z9181 History of falling: Secondary | ICD-10-CM | POA: Diagnosis not present

## 2019-12-23 DIAGNOSIS — M549 Dorsalgia, unspecified: Secondary | ICD-10-CM | POA: Diagnosis not present

## 2019-12-25 ENCOUNTER — Encounter: Payer: Self-pay | Admitting: Physical Medicine & Rehabilitation

## 2019-12-25 ENCOUNTER — Other Ambulatory Visit: Payer: Self-pay

## 2019-12-25 ENCOUNTER — Encounter: Payer: PPO | Attending: Physical Medicine & Rehabilitation | Admitting: Physical Medicine & Rehabilitation

## 2019-12-25 VITALS — BP 159/80 | HR 87 | Temp 98.7°F | Ht 71.0 in | Wt 278.0 lb

## 2019-12-25 DIAGNOSIS — I6350 Cerebral infarction due to unspecified occlusion or stenosis of unspecified cerebral artery: Secondary | ICD-10-CM

## 2019-12-25 DIAGNOSIS — I69351 Hemiplegia and hemiparesis following cerebral infarction affecting right dominant side: Secondary | ICD-10-CM | POA: Diagnosis not present

## 2019-12-25 DIAGNOSIS — I639 Cerebral infarction, unspecified: Secondary | ICD-10-CM

## 2019-12-25 NOTE — Progress Notes (Signed)
Subjective:  79 year old right-handed male with history of hypertension, morbid obesity with BMI 39.51, diabetes mellitus with neuropathy, chronic back pain with lumbar laminectomy decompression 01/20/2016, hyperlipidemia. Per chart review patient lives with spouse. Independent with a straight point cane. 1 level home 3 steps to entry. Family assistance as needed. Presented to [facility] 10/26/2019 with slurred speech and right-sided weakness. Admission chemistries with alcohol negative, glucose 136, BUN 27, creatinine 1.20, urine drug screen negative. Cranial CT scan negative for acute changes. 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 CT angiogram of head and neck no emergent large vessel occlusion or high-grade stenosis. MRI of the brain showed few small acute to subacute infarcts multiple vascular territories. Chronic microvascular ischemic changes. Patient did not receive TPA. Echocardiogram with ejection fraction of 65%. Grade 1 diastolic dysfunction. Lower extremity Doppler negative for DVT. Currently on aspirin and Plavix for CVA prophylaxis.   Patient ID: Jordan Chang, male    DOB: 1940/09/09, 79 y.o.   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  We discussed that he does not have sufficient coordination or reaction time for this at this point. Pain Inventory Average Pain 0 Pain Right Now 0 My pain is no pain  In the last 24 hours, has pain interfered with the following? General activity 0 Relation with others 0 Enjoyment of life 0 What TIME of day is your pain at its worst? no pain Sleep (in general) Good  Pain is worse with: no pain Pain improves with: no pain Relief from Meds: no pain  Mobility use a walker ability to  climb steps?  yes do you drive?  yes  Function I need assistance with the following:  meal prep, household duties and shopping  Neuro/Psych tremor  Prior Studies Any changes since last visit?  no  Physicians involved in your care Any changes since last visit?  no   No family history on file. Social History   Socioeconomic History  . Marital status: Married    Spouse name: Not on file  . Number of children: Not on file  . Years of education: Not on file  . Highest education level: Not on file  Occupational History  . Not on file  Tobacco Use  . Smoking status: Never Smoker  . Smokeless tobacco: Never Used  Vaping Use  . Vaping Use: Never used  Substance and Sexual Activity  . Alcohol use: Not Currently  . Drug use: No  . Sexual activity: Not Currently  Other Topics Concern  . Not on file  Social History Narrative  . Not on file   Social Determinants of Health   Financial Resource Strain:   . Difficulty of Paying Living Expenses:   Food Insecurity:   . Worried About Programme researcher, broadcasting/film/video in the Last Year:   . Barista in the Last Year:   Transportation Needs:   . Freight forwarder (Medical):   Marland Kitchen Lack of Transportation (Non-Medical):   Physical Activity:   . Days of Exercise per Week:   . Minutes of Exercise per Session:   Stress:   . Feeling of Stress :   Social Connections:   . Frequency of Communication with Friends and Family:   .  Frequency of Social Gatherings with Friends and Family:   . Attends Religious Services:   . Active Member of Clubs or Organizations:   . Attends Banker Meetings:   Marland Kitchen Marital Status:    Past Surgical History:  Procedure Laterality Date  . BACK SURGERY  05/2011  . EYE SURGERY     cataracts  . LUMBAR LAMINECTOMY/DECOMPRESSION MICRODISCECTOMY Left 01/20/2016   Procedure: Left Lumbar two-three, Lumbar three-four,  Lumbar four-five Microdiskectomy;  Surgeon: Tressie Stalker, MD;  Location: [facility];   Service: Neurosurgery;  Laterality: Left;   Past Medical History:  Diagnosis Date  . GERD (gastroesophageal reflux disease)    occ  . Hyperlipemia   . Hypertension    BP (!) 159/80   Pulse 87   Temp 98.7 F (37.1 C)   Ht 5\' 11"  (1.803 m)   Wt 278 lb (126.1 kg)   SpO2 94%   BMI 38.77 kg/m   Opioid Risk Score:   Fall Risk Score:  `1  Depression screen PHQ 2/9  Depression screen [facility] 2/9 12/25/2019 11/27/2019  Decreased Interest 0 0  Down, Depressed, Hopeless 0 0  PHQ - 2 Score 0 0     Review of Systems  Constitutional: Negative.   HENT: Negative.   Eyes: Negative.   Respiratory: Negative.   Cardiovascular: Positive for leg swelling.       Heart monitor in use  Gastrointestinal: Negative.   Endocrine: Negative.   Genitourinary: Negative.   Musculoskeletal: Negative.   Skin: Negative.   Allergic/Immunologic: Negative.   Neurological: Positive for weakness.  Hematological: Negative.   Psychiatric/Behavioral: Negative.   All other systems reviewed and are negative.      Objective:   Physical Exam Vitals and nursing note reviewed.  Constitutional:      Appearance: He is obese.  HENT:     Head: Normocephalic and atraumatic.     Comments: Hard of hearing Eyes:     Extraocular Movements: Extraocular movements intact.     Conjunctiva/sclera: Conjunctivae normal.     Pupils: Pupils are equal, round, and reactive to light.  Neurological:     Mental Status: He is alert and oriented to person, place, and time.     Comments: Motor strength is 4/5 in the right deltoid bicep tricep grip hip flexor knee extensor ankle dorsiflexion 5/5 in the left deltoid bicep tricep grip hip flexor knee extensor flexor Sensation equal to light touch bilateral upper limbs Ambulates with rolling walker no evidence of toe drag or knee instability  Psychiatric:        Mood and Affect: Mood normal.        Thought Content: Thought content normal.        Judgment: Judgment normal.            Assessment & Plan:  #1.   embolic strokes in multiple territories however symptoms of primary motor weakness without cognitive deficits most likely due to the left pontine infarction seen on MRI 10/26/2019 Overall making excellent recovery.  He will continue with his home health therapy.  Recommend follow-up with neurology.  I will see him back in approximately 1 month.

## 2019-12-25 NOTE — Patient Instructions (Signed)
No driving or lawn mowing

## 2019-12-30 DIAGNOSIS — Z7982 Long term (current) use of aspirin: Secondary | ICD-10-CM | POA: Diagnosis not present

## 2019-12-30 DIAGNOSIS — E1140 Type 2 diabetes mellitus with diabetic neuropathy, unspecified: Secondary | ICD-10-CM | POA: Diagnosis not present

## 2019-12-30 DIAGNOSIS — G8929 Other chronic pain: Secondary | ICD-10-CM | POA: Diagnosis not present

## 2019-12-30 DIAGNOSIS — H9193 Unspecified hearing loss, bilateral: Secondary | ICD-10-CM | POA: Diagnosis not present

## 2019-12-30 DIAGNOSIS — E1165 Type 2 diabetes mellitus with hyperglycemia: Secondary | ICD-10-CM | POA: Diagnosis not present

## 2019-12-30 DIAGNOSIS — I69322 Dysarthria following cerebral infarction: Secondary | ICD-10-CM | POA: Diagnosis not present

## 2019-12-30 DIAGNOSIS — I69359 Hemiplegia and hemiparesis following cerebral infarction affecting unspecified side: Secondary | ICD-10-CM | POA: Diagnosis not present

## 2019-12-30 DIAGNOSIS — I69328 Other speech and language deficits following cerebral infarction: Secondary | ICD-10-CM | POA: Diagnosis not present

## 2019-12-30 DIAGNOSIS — Z7902 Long term (current) use of antithrombotics/antiplatelets: Secondary | ICD-10-CM | POA: Diagnosis not present

## 2019-12-30 DIAGNOSIS — E782 Mixed hyperlipidemia: Secondary | ICD-10-CM | POA: Diagnosis not present

## 2019-12-30 DIAGNOSIS — K219 Gastro-esophageal reflux disease without esophagitis: Secondary | ICD-10-CM | POA: Diagnosis not present

## 2019-12-30 DIAGNOSIS — N39498 Other specified urinary incontinence: Secondary | ICD-10-CM | POA: Diagnosis not present

## 2019-12-30 DIAGNOSIS — I639 Cerebral infarction, unspecified: Secondary | ICD-10-CM | POA: Diagnosis not present

## 2019-12-30 DIAGNOSIS — I69351 Hemiplegia and hemiparesis following cerebral infarction affecting right dominant side: Secondary | ICD-10-CM | POA: Diagnosis not present

## 2019-12-30 DIAGNOSIS — Z6839 Body mass index (BMI) 39.0-39.9, adult: Secondary | ICD-10-CM | POA: Diagnosis not present

## 2019-12-30 DIAGNOSIS — N401 Enlarged prostate with lower urinary tract symptoms: Secondary | ICD-10-CM | POA: Diagnosis not present

## 2019-12-30 DIAGNOSIS — I10 Essential (primary) hypertension: Secondary | ICD-10-CM | POA: Diagnosis not present

## 2019-12-30 DIAGNOSIS — M549 Dorsalgia, unspecified: Secondary | ICD-10-CM | POA: Diagnosis not present

## 2019-12-30 DIAGNOSIS — M109 Gout, unspecified: Secondary | ICD-10-CM | POA: Diagnosis not present

## 2019-12-30 DIAGNOSIS — E785 Hyperlipidemia, unspecified: Secondary | ICD-10-CM | POA: Diagnosis not present

## 2019-12-30 DIAGNOSIS — Z9181 History of falling: Secondary | ICD-10-CM | POA: Diagnosis not present

## 2020-01-05 ENCOUNTER — Other Ambulatory Visit: Payer: Self-pay | Admitting: *Deleted

## 2020-01-05 DIAGNOSIS — I4891 Unspecified atrial fibrillation: Secondary | ICD-10-CM

## 2020-01-05 DIAGNOSIS — I639 Cerebral infarction, unspecified: Secondary | ICD-10-CM

## 2020-01-06 ENCOUNTER — Encounter: Payer: Self-pay | Admitting: Neurology

## 2020-01-06 ENCOUNTER — Ambulatory Visit: Payer: PPO | Admitting: Neurology

## 2020-01-06 VITALS — BP 142/82 | HR 86 | Ht 71.0 in | Wt 275.0 lb

## 2020-01-06 DIAGNOSIS — I639 Cerebral infarction, unspecified: Secondary | ICD-10-CM

## 2020-01-06 DIAGNOSIS — I63212 Cerebral infarction due to unspecified occlusion or stenosis of left vertebral arteries: Secondary | ICD-10-CM

## 2020-01-06 DIAGNOSIS — I6350 Cerebral infarction due to unspecified occlusion or stenosis of unspecified cerebral artery: Secondary | ICD-10-CM

## 2020-01-06 DIAGNOSIS — R260 Ataxic gait: Secondary | ICD-10-CM | POA: Diagnosis not present

## 2020-01-06 NOTE — Patient Instructions (Addendum)
I had a long d/w patient  And his wifeabout his recent strokes, gait atxia, risk for recurrent stroke/TIAs, personally independently reviewed imaging studies and stroke evaluation results and answered questions.Continue Plavix 75 mg daily alone and stop aspirin on 01/26/2020 for secondary stroke prevention and maintain strict control of hypertension with blood pressure goal below 130/90, diabetes with hemoglobin A1c goal below 6.5% and lipids with LDL cholesterol goal below 70 mg/dL. I also advised the patient to eat a healthy diet with plenty of whole grains, cereals, fruits and vegetables, exercise regularly and maintain ideal body weight.  Refer for outpatient physical and occupational therapy for gait and balance training.  He was advised to use his walker at all times and we talked about fall safety precautions.  Followup in the future with my nurse practitioner Shanda BumpsJessica in 3 months or call earlier if necessary.  Stroke Prevention Some medical conditions and behaviors are associated with a higher chance of having a stroke. You can help prevent a stroke by making nutrition, lifestyle, and other changes, including managing any medical conditions you may have. What nutrition changes can be made?   Eat healthy foods. You can do this by: ? Choosing foods high in fiber, such as fresh fruits and vegetables and whole grains. ? Eating at least 5 or more servings of fruits and vegetables a day. Try to fill half of your plate at each meal with fruits and vegetables. ? Choosing lean protein foods, such as lean cuts of meat, poultry without skin, fish, tofu, beans, and nuts. ? Eating low-fat dairy products. ? Avoiding foods that are high in salt (sodium). This can help lower blood pressure. ? Avoiding foods that have saturated fat, trans fat, and cholesterol. This can help prevent high cholesterol. ? Avoiding processed and premade foods.  Follow your health care provider's specific guidelines for losing weight,  controlling high blood pressure (hypertension), lowering high cholesterol, and managing diabetes. These may include: ? Reducing your daily calorie intake. ? Limiting your daily sodium intake to 1,500 milligrams (mg). ? Using only healthy fats for cooking, such as olive oil, canola oil, or sunflower oil. ? Counting your daily carbohydrate intake. What lifestyle changes can be made?  Maintain a healthy weight. Talk to your health care provider about your ideal weight.  Get at least 30 minutes of moderate physical activity at least 5 days a week. Moderate activity includes brisk walking, biking, and swimming.  Do not use any products that contain nicotine or tobacco, such as cigarettes and e-cigarettes. If you need help quitting, ask your health care provider. It may also be helpful to avoid exposure to secondhand smoke.  Limit alcohol intake to no more than 1 drink a day for nonpregnant women and 2 drinks a day for men. One drink equals 12 oz of beer, 5 oz of wine, or 1 oz of hard liquor.  Stop any illegal drug use.  Avoid taking birth control pills. Talk to your health care provider about the risks of taking birth control pills if: ? You are over 81 years old. ? You smoke. ? You get migraines. ? You have ever had a blood clot. What other changes can be made?  Manage your cholesterol levels. ? Eating a healthy diet is important for preventing high cholesterol. If cholesterol cannot be managed through diet alone, you may also need to take medicines. ? Take any prescribed medicines to control your cholesterol as told by your health care provider.  Manage your diabetes. ?  Eating a healthy diet and exercising regularly are important parts of managing your blood sugar. If your blood sugar cannot be managed through diet and exercise, you may need to take medicines. ? Take any prescribed medicines to control your diabetes as told by your health care provider.  Control your hypertension. ? To  reduce your risk of stroke, try to keep your blood pressure below 130/80. ? Eating a healthy diet and exercising regularly are an important part of controlling your blood pressure. If your blood pressure cannot be managed through diet and exercise, you may need to take medicines. ? Take any prescribed medicines to control hypertension as told by your health care provider. ? Ask your health care provider if you should monitor your blood pressure at home. ? Have your blood pressure checked every year, even if your blood pressure is normal. Blood pressure increases with age and some medical conditions.  Get evaluated for sleep disorders (sleep apnea). Talk to your health care provider about getting a sleep evaluation if you snore a lot or have excessive sleepiness.  Take over-the-counter and prescription medicines only as told by your health care provider. Aspirin or blood thinners (antiplatelets or anticoagulants) may be recommended to reduce your risk of forming blood clots that can lead to stroke.  Make sure that any other medical conditions you have, such as atrial fibrillation or atherosclerosis, are managed. What are the warning signs of a stroke? The warning signs of a stroke can be easily remembered as BEFAST.  B is for balance. Signs include: ? Dizziness. ? Loss of balance or coordination. ? Sudden trouble walking.  E is for eyes. Signs include: ? A sudden change in vision. ? Trouble seeing.  F is for face. Signs include: ? Sudden weakness or numbness of the face. ? The face or eyelid drooping to one side.  A is for arms. Signs include: ? Sudden weakness or numbness of the arm, usually on one side of the body.  S is for speech. Signs include: ? Trouble speaking (aphasia). ? Trouble understanding.  T is for time. ? These symptoms may represent a serious problem that is an emergency. Do not wait to see if the symptoms will go away. Get medical help right away. Call your local  emergency services (911 in the [place] Do not drive yourself to the hospital.  Other signs of stroke may include: ? A sudden, severe headache with no known cause. ? Nausea or vomiting. ? Seizure. Where to find more information For more information, visit:  American Stroke Association: www.strokeassociation.org  National Stroke Association: www.stroke.org Summary  You can prevent a stroke by eating healthy, exercising, not smoking, limiting alcohol intake, and managing any medical conditions you may have.  Do not use any products that contain nicotine or tobacco, such as cigarettes and e-cigarettes. If you need help quitting, ask your health care provider. It may also be helpful to avoid exposure to secondhand smoke.  Remember BEFAST for warning signs of stroke. Get help right away if you or a loved one has any of these signs. This information is not intended to replace advice given to you by your health care provider. Make sure you discuss any questions you have with your health care provider. Document Revised: 05/25/2017 Document Reviewed: 07/18/2016 Elsevier Patient Education  2020 [employer].  Fall Prevention in the Home, Adult Falls can cause injuries. They can happen to people of all ages. There are many things you can do to make your  home safe and to help prevent falls. Ask for help when making these changes, if needed. What actions can I take to prevent falls? General Instructions  Use good lighting in all rooms. Replace any light bulbs that burn out.  Turn on the lights when you go into a dark area. Use night-lights.  Keep items that you use often in easy-to-reach places. Lower the shelves around your home if necessary.  Set up your furniture so you have a clear path. Avoid moving your furniture around.  Do not have throw rugs and other things on the floor that can make you trip.  Avoid walking on wet floors.  If any of your floors are uneven, fix them.  Add color  or contrast paint or tape to clearly mark and help you see: ? Any grab bars or handrails. ? First and last steps of stairways. ? Where the edge of each step is.  If you use a stepladder: ? Make sure that it is fully opened. Do not climb a closed stepladder. ? Make sure that both sides of the stepladder are locked into place. ? Ask someone to hold the stepladder for you while you use it.  If there are any pets around you, be aware of where they are. What can I do in the bathroom?      Keep the floor dry. Clean up any water that spills onto the floor as soon as it happens.  Remove soap buildup in the tub or shower regularly.  Use non-skid mats or decals on the floor of the tub or shower.  Attach bath mats securely with double-sided, non-slip rug tape.  If you need to sit down in the shower, use a plastic, non-slip stool.  Install grab bars by the toilet and in the tub and shower. Do not use towel bars as grab bars. What can I do in the bedroom?  Make sure that you have a light by your bed that is easy to reach.  Do not use any sheets or blankets that are too big for your bed. They should not hang down onto the floor.  Have a firm chair that has side arms. You can use this for support while you get dressed. What can I do in the kitchen?  Clean up any spills right away.  If you need to reach something above you, use a strong step stool that has a grab bar.  Keep electrical cords out of the way.  Do not use floor polish or wax that makes floors slippery. If you must use wax, use non-skid floor wax. What can I do with my stairs?  Do not leave any items on the stairs.  Make sure that you have a light switch at the top of the stairs and the bottom of the stairs. If you do not have them, ask someone to add them for you.  Make sure that there are handrails on both sides of the stairs, and use them. Fix handrails that are broken or loose. Make sure that handrails are as long as  the stairways.  Install non-slip stair treads on all stairs in your home.  Avoid having throw rugs at the top or bottom of the stairs. If you do have throw rugs, attach them to the floor with carpet tape.  Choose a carpet that does not hide the edge of the steps on the stairway.  Check any carpeting to make sure that it is firmly attached to the stairs. Fix any  carpet that is loose or worn. What can I do on the outside of my home?  Use bright outdoor lighting.  Regularly fix the edges of walkways and driveways and fix any cracks.  Remove anything that might make you trip as you walk through a door, such as a raised step or threshold.  Trim any bushes or trees on the path to your home.  Regularly check to see if handrails are loose or broken. Make sure that both sides of any steps have handrails.  Install guardrails along the edges of any raised decks and porches.  Clear walking paths of anything that might make someone trip, such as tools or rocks.  Have any leaves, snow, or ice cleared regularly.  Use sand or salt on walking paths during winter.  Clean up any spills in your garage right away. This includes grease or oil spills. What other actions can I take?  Wear shoes that: ? Have a low heel. Do not wear high heels. ? Have rubber bottoms. ? Are comfortable and fit you well. ? Are closed at the toe. Do not wear open-toe sandals.  Use tools that help you move around (mobility aids) if they are needed. These include: ? Canes. ? Walkers. ? Scooters. ? Crutches.  Review your medicines with your doctor. Some medicines can make you feel dizzy. This can increase your chance of falling. Ask your doctor what other things you can do to help prevent falls. Where to find more information  Centers for Disease Control and Prevention, STEADI: HealthcareCounselor.com.pt  [employer] on Aging: RingConnections.si Contact a doctor if:  You are afraid of falling at  home.  You feel weak, drowsy, or dizzy at home.  You fall at home. Summary  There are many simple things that you can do to make your home safe and to help prevent falls.  Ways to make your home safe include removing tripping hazards and installing grab bars in the bathroom.  Ask for help when making these changes in your home. This information is not intended to replace advice given to you by your health care provider. Make sure you discuss any questions you have with your health care provider. Document Revised: 10/03/2018 Document Reviewed: 01/25/2017 Elsevier Patient Education  2020 [employer].

## 2020-01-06 NOTE — Progress Notes (Signed)
[facility] [address] [place]. [place] [address] [phone]       OFFICE CONSULT NOTE  Jordan Chang Date of Birth:  1941-01-26 Medical Record Number:  [id number]   Referring MD: Oneita Kras PA-C  Reason for Referral: Stroke  HPI: Jordan Chang is a 79 year old Caucasian male seen today for initial office consultation visit for his stroke.  History is obtained from the patient, and his wife review of electronic medical records and have personally reviewed available imaging films in PACS.  He is accompanied today by his wife.  He has past medical history of diabetes, hypertension, chronic back pain, neuropathy who presented to [facility] on 10/26/2019 with slurred speech and right leg and arm weakness and facial droop and difficulty walking.  He presented beyond time window for TPA.  CT angiogram was obtained which showed no intracranial large vessel occlusion but did show left vertebral artery occlusion in the neck and at this sclerotic changes at both carotid bifurcations.  MRI scan of the brain showed an acute left pontine as well as a tiny punctate right cerebellar as well as left frontal and right parietal cortical infarcts.  Lower extremity venous Dopplers were negative for DVT.  Echocardiogram showed ejection fraction of 60 to 65%.  LDL cholesterol is 133 mg percent.  Hemoglobin A1c was 6.6.  Urine drug screen was negative.  Patient subsequently had a 30-day heart monitor done which is just sent back in the mail this week and results are yet elevated.  He had weakness and trouble walking and was transferred to inpatient rehab where he stayed for 2 and half weeks and made gradual improvement.  Is currently living at home.  Is able to ambulate with a walker.  He is able to do most activities for himself like bathing, brushing teeth and combing his hair and shaving.  His just finished home physical and occupational therapy but no outpatient therapy has  been set up yet.  He is tolerating aspirin and Plavix well with only minor bruising and no bleeding.  His blood pressure is usually well controlled today it is borderline in office at 142/82.  He has no new complaints.  ROS:   14 system review of systems is positive for imbalance, difficulty walking, back pain, decreased hearing and all other systems negative PMH:  Past Medical History:  Diagnosis Date  . GERD (gastroesophageal reflux disease)    occ  . Hyperlipemia   . Hypertension     Social History:  Social History   Socioeconomic History  . Marital status: Married    Spouse name: Not on file  . Number of children: Not on file  . Years of education: Not on file  . Highest education level: Not on file  Occupational History  . Not on file  Tobacco Use  . Smoking status: Never Smoker  . Smokeless tobacco: Never Used  Vaping Use  . Vaping Use: Never used  Substance and Sexual Activity  . Alcohol use: Not Currently  . Drug use: No  . Sexual activity: Not Currently  Other Topics Concern  . Not on file  Social History Narrative  . Not on file   Social Determinants of Health   Financial Resource Strain:   . Difficulty of Paying Living Expenses:   Food Insecurity:   . Worried About Programme researcher, broadcasting/film/video in the Last Year:   . Barista in the Last Year:   Transportation Needs:   .  Lack of Transportation (Medical):   Marland Kitchen Lack of Transportation (Non-Medical):   Physical Activity:   . Days of Exercise per Week:   . Minutes of Exercise per Session:   Stress:   . Feeling of Stress :   Social Connections:   . Frequency of Communication with Friends and Family:   . Frequency of Social Gatherings with Friends and Family:   . Attends Religious Services:   . Active Member of Clubs or Organizations:   . Attends Banker Meetings:   Marland Kitchen Marital Status:   Intimate Partner Violence:   . Fear of Current or Ex-Partner:   . Emotionally Abused:   Marland Kitchen Physically Abused:    . Sexually Abused:     Medications:   Current Outpatient Medications on File Prior to Visit  Medication Sig Dispense Refill  . acetaminophen (TYLENOL) 325 MG tablet Take 2 tablets (650 mg total) by mouth every 4 (four) hours as needed for mild pain (or temp > 37.5 C (99.5 F)).    Marland Kitchen atorvastatin (LIPITOR) 40 MG tablet Take 1 tablet (40 mg total) by mouth every other day. 30 tablet 0  . Cholecalciferol (VITAMIN D-3) 25 MCG (1000 UT) CAPS Take 1 capsule (1,000 Units total) by mouth in the morning and at bedtime. 60 capsule 0  . clopidogrel (PLAVIX) 75 MG tablet Take 1 tablet (75 mg total) by mouth daily. 30 tablet 0  . colchicine 0.6 MG tablet Take 1 tablet (0.6 mg total) by mouth 2 (two) times daily. 60 tablet 0  . docusate sodium (COLACE) 100 MG capsule Take 1 capsule (100 mg total) by mouth 2 (two) times daily. 60 capsule 0  . furosemide (LASIX) 20 MG tablet Take 1 tablet (20 mg total) by mouth at bedtime. 30 tablet 1  . loratadine (CLARITIN) 10 MG tablet Take 1 tablet (10 mg total) by mouth daily. 30 tablet 0  . losartan (COZAAR) 25 MG tablet Take 1 tablet by mouth daily.    . metFORMIN (GLUCOPHAGE) 500 MG tablet Take 1 tablet by mouth daily.    . methocarbamol (ROBAXIN) 500 MG tablet Take 1 tablet (500 mg total) by mouth every 8 (eight) hours as needed for muscle spasms. 60 tablet 0  . pantoprazole (PROTONIX) 40 MG tablet Take 1 tablet (40 mg total) by mouth in the morning and at bedtime. 30 tablet 0  . tamsulosin (FLOMAX) 0.4 MG CAPS capsule Take 1 capsule (0.4 mg total) by mouth at bedtime. 30 capsule 1  . vitamin B-12 (CYANOCOBALAMIN) 1000 MCG tablet Take 1 tablet (1,000 mcg total) by mouth daily. 30 tablet 0   No current facility-administered medications on file prior to visit.    Allergies:   Allergies  Allergen Reactions  . Atorvastatin Shortness Of Breath and Other (See Comments)    Dyspnea   . Other Palpitations and Other (See Comments)    STEROIDS caused tachycardia (exact  one not recalled)    Physical Exam General: well developed, well nourished elderly Caucasian male, seated, in no evident distress Head: head normocephalic and atraumatic.   Neck: supple with no carotid or supraclavicular bruits Cardiovascular: regular rate and rhythm, no murmurs Musculoskeletal: no deformity Skin:  no rash/petichiae Vascular:  Normal pulses all extremities  Neurologic Exam Mental Status: Awake and fully alert. Oriented to place and time. Recent and remote memory intact. Attention span, concentration and fund of knowledge appropriate. Mood and affect appropriate.  Cranial Nerves: Fundoscopic exam reveals sharp disc margins. Pupils equal, briskly reactive  to light. Extraocular movements full without nystagmus. Visual fields full to confrontation. Hearing diminished bilaterally. Facial sensation intact. Face, tongue, palate moves normally and symmetrically.  Motor: Normal bulk and tone. Normal strength in all tested extremity muscles. Sensory.: intact to touch , pinprick , position and vibratory sensation.  Coordination: Rapid alternating movements normal in all extremities. Finger-to-nose and heel-to-shin performed accurately bilaterally. Gait and Station: Arises from chair without difficulty. Stance is wide-based. Gait demonstrates mild imbalance and uses a walker.  Unable to walk tandem..  Reflexes: 1+ and symmetric. Toes downgoing.   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  Vascular risk factors of hypertension, hyperlipidemia, diabetes and intracranial atherosclerosis.     PLAN: I had a long d/w patient  And his wifeabout his recent strokes, gait atxia, risk for recurrent stroke/TIAs, personally  independently reviewed imaging studies and stroke evaluation results and answered questions.Continue Plavix 75 mg daily alone and stop aspirin on 01/26/2020 for secondary stroke prevention and maintain strict control of hypertension with blood pressure goal below 130/90, diabetes with hemoglobin A1c goal below 6.5% and lipids with LDL cholesterol goal below 70 mg/dL. I also advised the patient to eat a healthy diet with plenty of whole grains, cereals, fruits and vegetables, exercise regularly and maintain ideal body weight.  Refer for outpatient physical and occupational therapy for gait and balance training.  He was advised to use his walker at all times and we talked about fall safety precautions.  Follow results of his 30-day heart monitor for paroxysmal A. fib.  Greater than 50% time during this 50-minute consultation was it was spent on counseling and coordination of care about his strokes and discussion about stroke prevention and treatment and answering questions.  Followup in the future with my nurse practitioner Shanda Bumps in 3 months or call earlier if necessary. Delia Heady, MD  [facility] [address] [place], [place] [address]  Phone [phone] Fax [phone] Note: This document was prepared with digital dictation and possible smart phrase technology. Any transcriptional errors that result from this process are unintentional.

## 2020-01-07 DIAGNOSIS — Z9181 History of falling: Secondary | ICD-10-CM | POA: Diagnosis not present

## 2020-01-07 DIAGNOSIS — E1140 Type 2 diabetes mellitus with diabetic neuropathy, unspecified: Secondary | ICD-10-CM | POA: Diagnosis not present

## 2020-01-07 DIAGNOSIS — N39498 Other specified urinary incontinence: Secondary | ICD-10-CM | POA: Diagnosis not present

## 2020-01-07 DIAGNOSIS — Z6839 Body mass index (BMI) 39.0-39.9, adult: Secondary | ICD-10-CM | POA: Diagnosis not present

## 2020-01-07 DIAGNOSIS — K219 Gastro-esophageal reflux disease without esophagitis: Secondary | ICD-10-CM | POA: Diagnosis not present

## 2020-01-07 DIAGNOSIS — I69322 Dysarthria following cerebral infarction: Secondary | ICD-10-CM | POA: Diagnosis not present

## 2020-01-07 DIAGNOSIS — N401 Enlarged prostate with lower urinary tract symptoms: Secondary | ICD-10-CM | POA: Diagnosis not present

## 2020-01-07 DIAGNOSIS — I10 Essential (primary) hypertension: Secondary | ICD-10-CM | POA: Diagnosis not present

## 2020-01-07 DIAGNOSIS — Z7902 Long term (current) use of antithrombotics/antiplatelets: Secondary | ICD-10-CM | POA: Diagnosis not present

## 2020-01-07 DIAGNOSIS — Z7982 Long term (current) use of aspirin: Secondary | ICD-10-CM | POA: Diagnosis not present

## 2020-01-07 DIAGNOSIS — M549 Dorsalgia, unspecified: Secondary | ICD-10-CM | POA: Diagnosis not present

## 2020-01-07 DIAGNOSIS — M109 Gout, unspecified: Secondary | ICD-10-CM | POA: Diagnosis not present

## 2020-01-07 DIAGNOSIS — I69351 Hemiplegia and hemiparesis following cerebral infarction affecting right dominant side: Secondary | ICD-10-CM | POA: Diagnosis not present

## 2020-01-07 DIAGNOSIS — G8929 Other chronic pain: Secondary | ICD-10-CM | POA: Diagnosis not present

## 2020-01-07 DIAGNOSIS — I69328 Other speech and language deficits following cerebral infarction: Secondary | ICD-10-CM | POA: Diagnosis not present

## 2020-01-07 DIAGNOSIS — E785 Hyperlipidemia, unspecified: Secondary | ICD-10-CM | POA: Diagnosis not present

## 2020-01-12 ENCOUNTER — Telehealth: Payer: Self-pay | Admitting: *Deleted

## 2020-01-12 NOTE — Progress Notes (Signed)
Kindly inform the patient that heart rhythm monitoring study did not show significant arrhythmias to worry about.

## 2020-01-12 NOTE — Telephone Encounter (Signed)
LVM informing patient his heart rhythm monitoring study did not show significant arrhythmias to worry about. Left # for questions.

## 2020-01-14 DIAGNOSIS — E782 Mixed hyperlipidemia: Secondary | ICD-10-CM | POA: Diagnosis not present

## 2020-01-14 DIAGNOSIS — I639 Cerebral infarction, unspecified: Secondary | ICD-10-CM | POA: Diagnosis not present

## 2020-01-14 DIAGNOSIS — I69359 Hemiplegia and hemiparesis following cerebral infarction affecting unspecified side: Secondary | ICD-10-CM | POA: Diagnosis not present

## 2020-01-14 DIAGNOSIS — E119 Type 2 diabetes mellitus without complications: Secondary | ICD-10-CM | POA: Diagnosis not present

## 2020-01-14 DIAGNOSIS — I10 Essential (primary) hypertension: Secondary | ICD-10-CM | POA: Diagnosis not present

## 2020-01-22 ENCOUNTER — Encounter: Payer: PPO | Admitting: Physical Medicine & Rehabilitation

## 2020-01-22 ENCOUNTER — Other Ambulatory Visit: Payer: Self-pay

## 2020-01-22 ENCOUNTER — Encounter: Payer: Self-pay | Admitting: Physical Medicine & Rehabilitation

## 2020-01-22 VITALS — BP 137/78 | HR 91 | Temp 98.3°F | Ht 71.0 in | Wt 279.0 lb

## 2020-01-22 DIAGNOSIS — I6350 Cerebral infarction due to unspecified occlusion or stenosis of unspecified cerebral artery: Secondary | ICD-10-CM | POA: Diagnosis not present

## 2020-01-22 DIAGNOSIS — I639 Cerebral infarction, unspecified: Secondary | ICD-10-CM

## 2020-01-22 NOTE — Progress Notes (Signed)
Subjective:    Patient ID: Jordan Chang, male    DOB: 11/11/1940, 79 y.o.   MRN: [id number]  HPI 79 year old male with history of right hemiparesis, secondary to left pontine infarct onset 10/26/2019.  MRI of the brain also showed periventricular white matter infarcts as well as small scattered cerebellar infarcts. The patient completed inpatient rehabilitation [place] stroke program  Hx of gout but no recent flareups  Mod I ADL , standing in shower Ambulates with a walker no recent falls   Pain Inventory Average Pain 0 Pain Right Now 0 My pain is no pain  In the last 24 hours, has pain interfered with the following? General activity 0 Relation with others 0 Enjoyment of life 0 What TIME of day is your pain at its worst? no pain Sleep (in general) Good  Pain is worse with: no pain Pain improves with: no pain Relief from Meds: no pain  Mobility use a walker how many minutes can you walk? 30 ability to climb steps?  yes do you drive?  no  Function retired  Neuro/Psych tremor  Prior Studies Any changes since last visit?  no  Physicians involved in your care Neurologist . Cardiologist   No family history on file. Social History   Socioeconomic History  . Marital status: Married    Spouse name: Not on file  . Number of children: Not on file  . Years of education: Not on file  . Highest education level: Not on file  Occupational History  . Not on file  Tobacco Use  . Smoking status: Never Smoker  . Smokeless tobacco: Never Used  Vaping Use  . Vaping Use: Never used  Substance and Sexual Activity  . Alcohol use: Not Currently  . Drug use: No  . Sexual activity: Not Currently  Other Topics Concern  . Not on file  Social History Narrative  . Not on file   Social Determinants of Health   Financial Resource Strain:   . Difficulty of Paying Living Expenses:   Food Insecurity:   . Worried About Programme researcher, broadcasting/film/video in the Last Year:   . Occupational psychologist in the Last Year:   Transportation Needs:   . Freight forwarder (Medical):   Marland Kitchen Lack of Transportation (Non-Medical):   Physical Activity:   . Days of Exercise per Week:   . Minutes of Exercise per Session:   Stress:   . Feeling of Stress :   Social Connections:   . Frequency of Communication with Friends and Family:   . Frequency of Social Gatherings with Friends and Family:   . Attends Religious Services:   . Active Member of Clubs or Organizations:   . Attends Banker Meetings:   Marland Kitchen Marital Status:    Past Surgical History:  Procedure Laterality Date  . BACK SURGERY  05/2011  . EYE SURGERY     cataracts  . LUMBAR LAMINECTOMY/DECOMPRESSION MICRODISCECTOMY Left 01/20/2016   Procedure: Left Lumbar two-three, Lumbar three-four,  Lumbar four-five Microdiskectomy;  Surgeon: Tressie Stalker, MD;  Location: [facility];  Service: Neurosurgery;  Laterality: Left;   Past Medical History:  Diagnosis Date  . GERD (gastroesophageal reflux disease)    occ  . Hyperlipemia   . Hypertension    BP (!) 137/78   Pulse 91   Temp 98.3 F (36.8 C)   Ht 5\' 11"  (1.803 m)   Wt (!) 279 lb (126.6 kg)   SpO2 94%  BMI 38.91 kg/m   Opioid Risk Score:   Fall Risk Score:  `1  Depression screen PHQ 2/9  Depression screen [facility] 2/9 12/25/2019 11/27/2019  Decreased Interest 0 0  Down, Depressed, Hopeless 0 0  PHQ - 2 Score 0 0   Review of Systems  Musculoskeletal: Positive for gait problem.  Neurological: Positive for tremors.  All other systems reviewed and are negative.      Objective:   Physical Exam Constitutional:      Appearance: He is obese.  HENT:     Head: Normocephalic and atraumatic.  Eyes:     Extraocular Movements: Extraocular movements intact.     Conjunctiva/sclera: Conjunctivae normal.     Pupils: Pupils are equal, round, and reactive to light.  Neurological:     Mental Status: He is alert.  Psychiatric:        Mood and Affect: Mood  normal.        Behavior: Behavior normal.   Left 4th knuckle scrape  Motor strength is 4/5 in the right deltoid, bicep, tricep, grip, hip flexor, knee extensor ankle dorsiflexor 5/5 in left deltoid, bicep, tricep, grip, hip flexor, knee extensor, ankle dorsiflexor and plantar flexor Sensation intact light touch bilateral upper limbs Ambulates with a walker no evidence of toe drag or knee instability Extremities 2+ edema bilateral pretibial and pedal       Assessment & Plan:  #1.  Left pontine infarct causing right hemiparesis overall doing well from a mobility standpoint still not back to baseline.  As per neuro, agree with outpatient therapy.  I will see him back in approximately 3 months.  Advised no driving.  Discussed need to use walker whenever up.

## 2020-01-22 NOTE — Patient Instructions (Signed)
No driving °

## 2020-02-26 DIAGNOSIS — E1165 Type 2 diabetes mellitus with hyperglycemia: Secondary | ICD-10-CM | POA: Diagnosis not present

## 2020-02-26 DIAGNOSIS — H9193 Unspecified hearing loss, bilateral: Secondary | ICD-10-CM | POA: Diagnosis not present

## 2020-02-26 DIAGNOSIS — Z8673 Personal history of transient ischemic attack (TIA), and cerebral infarction without residual deficits: Secondary | ICD-10-CM | POA: Diagnosis not present

## 2020-02-26 DIAGNOSIS — N401 Enlarged prostate with lower urinary tract symptoms: Secondary | ICD-10-CM | POA: Diagnosis not present

## 2020-02-26 DIAGNOSIS — I69359 Hemiplegia and hemiparesis following cerebral infarction affecting unspecified side: Secondary | ICD-10-CM | POA: Diagnosis not present

## 2020-02-26 DIAGNOSIS — Z09 Encounter for follow-up examination after completed treatment for conditions other than malignant neoplasm: Secondary | ICD-10-CM | POA: Diagnosis not present

## 2020-02-26 DIAGNOSIS — I10 Essential (primary) hypertension: Secondary | ICD-10-CM | POA: Diagnosis not present

## 2020-03-16 DIAGNOSIS — Z23 Encounter for immunization: Secondary | ICD-10-CM | POA: Diagnosis not present

## 2020-03-16 DIAGNOSIS — I10 Essential (primary) hypertension: Secondary | ICD-10-CM | POA: Diagnosis not present

## 2020-03-16 DIAGNOSIS — Z0000 Encounter for general adult medical examination without abnormal findings: Secondary | ICD-10-CM | POA: Diagnosis not present

## 2020-03-16 DIAGNOSIS — Z8673 Personal history of transient ischemic attack (TIA), and cerebral infarction without residual deficits: Secondary | ICD-10-CM | POA: Diagnosis not present

## 2020-03-16 DIAGNOSIS — E782 Mixed hyperlipidemia: Secondary | ICD-10-CM | POA: Diagnosis not present

## 2020-03-16 DIAGNOSIS — E1165 Type 2 diabetes mellitus with hyperglycemia: Secondary | ICD-10-CM | POA: Diagnosis not present

## 2020-03-16 DIAGNOSIS — I700 Atherosclerosis of aorta: Secondary | ICD-10-CM | POA: Diagnosis not present

## 2020-04-14 DIAGNOSIS — E782 Mixed hyperlipidemia: Secondary | ICD-10-CM | POA: Diagnosis not present

## 2020-04-14 DIAGNOSIS — Z8673 Personal history of transient ischemic attack (TIA), and cerebral infarction without residual deficits: Secondary | ICD-10-CM | POA: Diagnosis not present

## 2020-04-14 DIAGNOSIS — I10 Essential (primary) hypertension: Secondary | ICD-10-CM | POA: Diagnosis not present

## 2020-04-14 DIAGNOSIS — E119 Type 2 diabetes mellitus without complications: Secondary | ICD-10-CM | POA: Diagnosis not present

## 2020-04-14 DIAGNOSIS — I77810 Thoracic aortic ectasia: Secondary | ICD-10-CM | POA: Diagnosis not present

## 2020-04-23 ENCOUNTER — Encounter: Payer: Self-pay | Admitting: Physical Medicine & Rehabilitation

## 2020-04-23 ENCOUNTER — Encounter: Payer: PPO | Attending: Physical Medicine & Rehabilitation | Admitting: Physical Medicine & Rehabilitation

## 2020-04-23 ENCOUNTER — Other Ambulatory Visit: Payer: Self-pay

## 2020-04-23 VITALS — BP 145/78 | HR 86 | Temp 98.9°F | Ht 71.0 in | Wt 279.0 lb

## 2020-04-23 DIAGNOSIS — I69351 Hemiplegia and hemiparesis following cerebral infarction affecting right dominant side: Secondary | ICD-10-CM | POA: Insufficient documentation

## 2020-04-23 DIAGNOSIS — I639 Cerebral infarction, unspecified: Secondary | ICD-10-CM | POA: Diagnosis not present

## 2020-04-23 NOTE — Progress Notes (Signed)
Subjective:    Patient ID: Jordan Chang, male    DOB: 1941/03/07, 79 y.o.   MRN: [id number]  HPI No falls , now using cane instead of walker   Mod I dressing and bathing Now using riding mower, his wife supervises him.  He would like to get back to driving. He follows with his primary care physician for general medical issues.  Has follow-up appointment with neurology Pain Inventory Average Pain 0 Pain Right Now 0 My pain is  no pain  In the last 24 hours, has pain interfered with the following? General activity 0 Relation with others 0 Enjoyment of life 0 What TIME of day is your pain at its worst? na Sleep (in general) Good  Pain is worse with:  no pain Pain improves with:  no pain Relief from Meds:  no pain  No family history on file. Social History   Socioeconomic History   Marital status: Married    Spouse name: Not on file   Number of children: Not on file   Years of education: Not on file   Highest education level: Not on file  Occupational History   Not on file  Tobacco Use   Smoking status: Never Smoker   Smokeless tobacco: Never Used  Vaping Use   Vaping Use: Never used  Substance and Sexual Activity   Alcohol use: Not Currently   Drug use: No   Sexual activity: Not Currently  Other Topics Concern   Not on file  Social History Narrative   Not on file   Social Determinants of Health   Financial Resource Strain:    Difficulty of Paying Living Expenses: Not on file  Food Insecurity:    Worried About Running Out of Food in the Last Year: Not on file   Ran Out of Food in the Last Year: Not on file  Transportation Needs:    Lack of Transportation (Medical): Not on file   Lack of Transportation (Non-Medical): Not on file  Physical Activity:    Days of Exercise per Week: Not on file   Minutes of Exercise per Session: Not on file  Stress:    Feeling of Stress : Not on file  Social Connections:    Frequency of Communication with Friends and  Family: Not on file   Frequency of Social Gatherings with Friends and Family: Not on file   Attends Religious Services: Not on file   Active Member of Clubs or Organizations: Not on file   Attends Banker Meetings: Not on file   Marital Status: Not on file   Past Surgical History:  Procedure Laterality Date   BACK SURGERY  05/2011   EYE SURGERY     cataracts   LUMBAR LAMINECTOMY/DECOMPRESSION MICRODISCECTOMY Left 01/20/2016   Procedure: Left Lumbar two-three, Lumbar three-four,  Lumbar four-five Microdiskectomy;  Surgeon: Tressie Stalker, MD;  Location: [facility];  Service: Neurosurgery;  Laterality: Left;   Past Surgical History:  Procedure Laterality Date   BACK SURGERY  05/2011   EYE SURGERY     cataracts   LUMBAR LAMINECTOMY/DECOMPRESSION MICRODISCECTOMY Left 01/20/2016   Procedure: Left Lumbar two-three, Lumbar three-four,  Lumbar four-five Microdiskectomy;  Surgeon: Tressie Stalker, MD;  Location: [facility];  Service: Neurosurgery;  Laterality: Left;   Past Medical History:  Diagnosis Date   GERD (gastroesophageal reflux disease)    occ   Hyperlipemia    Hypertension    BP (!) 145/78   Pulse 86  Temp 98.9 F (37.2 C)   Ht 5\' 11"  (1.803 m)   Wt 279 lb (126.6 kg)   SpO2 96%   BMI 38.91 kg/m   Opioid Risk Score:   Fall Risk Score:  `1  Depression screen PHQ 2/9  Depression screen [facility] 2/9 01/22/2020 12/25/2019 11/27/2019  Decreased Interest 0 0 0  Down, Depressed, Hopeless 0 0 0  PHQ - 2 Score 0 0 0    Review of Systems  Musculoskeletal: Positive for gait problem.  All other systems reviewed and are negative.      Objective:   Physical Exam No evidence of facial droop Obese male in no acute distress Cervical spine range of motion within functional limits for age Mood and affect are appropriate HEENT hard of hearing Motor strength 5/5 bilateral deltoid bicep tricep grip hip flexor knee extensor ankle dorsiflexor Ambulates with a cane no  evidence of toe drag or knee instability, widened base of support short step length. Speech without evidence of dysarthria or aphasia Visual fields are intact No evidence of ataxia    Assessment & Plan:  #1.    History of left pontine infarct his right hemiparesis has largely improved.  He still has a mild gait disorder but overall has achieved a modified independent level.  I do think he is appropriate for graduated return to driving but would restrict nighttime driving.  Discussed with patient and his wife.  They agree.  Graduated return to driving instructions were provided. It is recommended that the patient first drives with another licensed driver in an empty parking lot. If the patient does well with this, and they can drive on a quiet street with the licensed driver. If the patient does well with this they can drive on a busy street with a licensed driver. If the patient does well with this, the next time out they can go by himself. For the first month after resuming driving, I recommend no nighttime or Interstate driving.    Return to clinic as needed  Follow-up with neurology next month

## 2020-04-23 NOTE — Patient Instructions (Signed)

## 2020-04-26 ENCOUNTER — Encounter: Payer: Self-pay | Admitting: Adult Health

## 2020-04-26 ENCOUNTER — Ambulatory Visit: Payer: PPO | Admitting: Adult Health

## 2020-04-26 ENCOUNTER — Other Ambulatory Visit: Payer: Self-pay

## 2020-04-26 VITALS — BP 155/74 | HR 81 | Ht 72.0 in | Wt 279.0 lb

## 2020-04-26 DIAGNOSIS — I10 Essential (primary) hypertension: Secondary | ICD-10-CM | POA: Diagnosis not present

## 2020-04-26 DIAGNOSIS — E785 Hyperlipidemia, unspecified: Secondary | ICD-10-CM

## 2020-04-26 DIAGNOSIS — I639 Cerebral infarction, unspecified: Secondary | ICD-10-CM | POA: Diagnosis not present

## 2020-04-26 DIAGNOSIS — E119 Type 2 diabetes mellitus without complications: Secondary | ICD-10-CM | POA: Diagnosis not present

## 2020-04-26 NOTE — Progress Notes (Signed)
I agree with the above plan 

## 2020-04-26 NOTE — Progress Notes (Signed)
[facility] [address] [place]. [place] [address] [phone]       OFFICE FOLLOW UP NOTE  Mr. Jordan Chang Date of Birth:  1941-02-27 Medical Record Number:  [id number]   Referring MD: Oneita Kras PA-C  Reason for Referral: Stroke  Chief Complaint  Patient presents with  . Follow-up    stroke fu, rm 9, with wife, states pt is doing well      HPI:   Today, 04/26/2020, Jordan Chang returns for stroke follow-up accompanied by his wife.  He has been stable since prior visit with residual gait impairment and decreased right hand dexterity reporting improvement since prior visit.  Currently ambulating with a cane and denies any recent falls.  He continues to stay active doing all exercises at home as advised.  Denies new or worsening stroke/TIA symptoms.  Remains on Plavix and atorvastatin 40 mg daily for secondary stroke prevention without side effects.  Recent lipid panel showed LDL 69.  Blood pressure today 155/94.  Recent A1c 6.2. Cardiac monitor completed which was negative for atrial fibrillation. Per wife, patient received second dose of Pfizer vaccination at the end of March and questions if this could have contributed to his stroke.  No further concerns at this time.   History provided for reference purposes only Initial visit 01/06/2020 Dr. Pearlean Brownie Jordan Chang is a 79 year old Caucasian male seen today for initial office consultation visit for his stroke.  History is obtained from the patient, and his wife review of electronic medical records and have personally reviewed available imaging films in PACS.  He is accompanied today by his wife.  He has past medical history of diabetes, hypertension, chronic back pain, neuropathy who presented to [facility] on 10/26/2019 with slurred speech and right leg and arm weakness and facial droop and difficulty walking.  He presented beyond time window for TPA.  CT angiogram was obtained which showed no  intracranial large vessel occlusion but did show left vertebral artery occlusion in the neck and at this sclerotic changes at both carotid bifurcations.  MRI scan of the brain showed an acute left pontine as well as a tiny punctate right cerebellar as well as left frontal and right parietal cortical infarcts.  Lower extremity venous Dopplers were negative for DVT.  Echocardiogram showed ejection fraction of 60 to 65%.  LDL cholesterol is 133 mg percent.  Hemoglobin A1c was 6.6.  Urine drug screen was negative.  Patient subsequently had a 30-day heart monitor done which is just sent back in the mail this week and results are yet elevated.  He had weakness and trouble walking and was transferred to inpatient rehab where he stayed for 2 and half weeks and made gradual improvement.  Is currently living at home.  Is able to ambulate with a walker.  He is able to do most activities for himself like bathing, brushing teeth and combing his hair and shaving.  His just finished home physical and occupational therapy but no outpatient therapy has been set up yet.  He is tolerating aspirin and Plavix well with only minor bruising and no bleeding.  His blood pressure is usually well controlled today it is borderline in office at 142/82.  He has no new complaints.    ROS:   14 system review of systems is positive for those listed in HPI and all other systems negative   PMH:  Past Medical History:  Diagnosis Date  . GERD (gastroesophageal reflux disease)  occ  . Hyperlipemia   . Hypertension     Social History:  Social History   Socioeconomic History  . Marital status: Married    Spouse name: Not on file  . Number of children: Not on file  . Years of education: Not on file  . Highest education level: Not on file  Occupational History  . Not on file  Tobacco Use  . Smoking status: Never Smoker  . Smokeless tobacco: Never Used  Vaping Use  . Vaping Use: Never used  Substance and Sexual Activity  .  Alcohol use: Not Currently  . Drug use: No  . Sexual activity: Not Currently  Other Topics Concern  . Not on file  Social History Narrative  . Not on file   Social Determinants of Health   Financial Resource Strain:   . Difficulty of Paying Living Expenses: Not on file  Food Insecurity:   . Worried About Programme researcher, broadcasting/film/video in the Last Year: Not on file  . Ran Out of Food in the Last Year: Not on file  Transportation Needs:   . Lack of Transportation (Medical): Not on file  . Lack of Transportation (Non-Medical): Not on file  Physical Activity:   . Days of Exercise per Week: Not on file  . Minutes of Exercise per Session: Not on file  Stress:   . Feeling of Stress : Not on file  Social Connections:   . Frequency of Communication with Friends and Family: Not on file  . Frequency of Social Gatherings with Friends and Family: Not on file  . Attends Religious Services: Not on file  . Active Member of Clubs or Organizations: Not on file  . Attends Banker Meetings: Not on file  . Marital Status: Not on file  Intimate Partner Violence:   . Fear of Current or Ex-Partner: Not on file  . Emotionally Abused: Not on file  . Physically Abused: Not on file  . Sexually Abused: Not on file    Medications:   Current Outpatient Medications on File Prior to Visit  Medication Sig Dispense Refill  . acetaminophen (TYLENOL) 325 MG tablet Take 2 tablets (650 mg total) by mouth every 4 (four) hours as needed for mild pain (or temp > 37.5 C (99.5 F)).    Marland Kitchen atorvastatin (LIPITOR) 40 MG tablet Take 1 tablet (40 mg total) by mouth every other day. 30 tablet 0  . Cholecalciferol (VITAMIN D-3) 25 MCG (1000 UT) CAPS Take 1 capsule (1,000 Units total) by mouth in the morning and at bedtime. 60 capsule 0  . clopidogrel (PLAVIX) 75 MG tablet Take 1 tablet (75 mg total) by mouth daily. 30 tablet 0  . colchicine 0.6 MG tablet Take 1 tablet (0.6 mg total) by mouth 2 (two) times daily. 60 tablet  0  . docusate sodium (COLACE) 100 MG capsule Take 1 capsule (100 mg total) by mouth 2 (two) times daily. 60 capsule 0  . furosemide (LASIX) 20 MG tablet Take 1 tablet (20 mg total) by mouth at bedtime. 30 tablet 1  . loratadine (CLARITIN) 10 MG tablet Take 1 tablet (10 mg total) by mouth daily. 30 tablet 0  . losartan (COZAAR) 25 MG tablet Take 1 tablet by mouth daily.    . metFORMIN (GLUCOPHAGE) 500 MG tablet Take 1 tablet by mouth daily.    . methocarbamol (ROBAXIN) 500 MG tablet Take 1 tablet (500 mg total) by mouth every 8 (eight) hours as needed for muscle  spasms. 60 tablet 0  . pantoprazole (PROTONIX) 40 MG tablet Take 1 tablet (40 mg total) by mouth in the morning and at bedtime. 30 tablet 0  . tamsulosin (FLOMAX) 0.4 MG CAPS capsule Take 1 capsule (0.4 mg total) by mouth at bedtime. 30 capsule 1  . vitamin B-12 (CYANOCOBALAMIN) 1000 MCG tablet Take 1 tablet (1,000 mcg total) by mouth daily. 30 tablet 0   No current facility-administered medications on file prior to visit.    Allergies:   Allergies  Allergen Reactions  . Atorvastatin Shortness Of Breath and Other (See Comments)    Dyspnea   . Other Palpitations and Other (See Comments)    STEROIDS caused tachycardia (exact one not recalled)    Physical Exam Today's Vitals   04/26/20 1348  BP: (!) 155/74  Pulse: 81  Weight: 279 lb (126.6 kg)  Height: 6' (1.829 m)   Body mass index is 37.84 kg/m.   General: well developed, well nourished very pleasant elderly Caucasian male, seated, in no evident distress Head: head normocephalic and atraumatic.   Neck: supple with no carotid or supraclavicular bruits Cardiovascular: regular rate and rhythm, no murmurs Musculoskeletal: no deformity Skin:  no rash/petichiae Vascular:  Normal pulses all extremities  Neurologic Exam Mental Status: Awake and fully alert.  Mild dysarthria.  Oriented to place and time. Recent and remote memory intact. Attention span, concentration and fund  of knowledge appropriate. Mood and affect appropriate.  Cranial Nerves: Pupils equal, briskly reactive to light. Extraocular movements full without nystagmus. Visual fields full to confrontation. Hearing diminished bilaterally. Facial sensation intact. Face, tongue, palate moves normally and symmetrically.  Motor: Normal bulk and tone. Normal strength in all tested extremity muscles. Sensory.: intact to touch , pinprick , position and vibratory sensation.  Coordination: Rapid alternating movements decreased right hand. Finger-to-nose and heel-to-shin performed accurately bilaterally.  Orbits left arm over right arm. Gait and Station: Arises from chair without difficulty. Stance is wide-based. Gait demonstrates normal stride length and balance with use of cane.  Mild unsteadiness with turns.  Unable to perform heel and toe walk or tandem Reflexes: 1+ and symmetric. Toes downgoing.     ASSESSMENT/PLAN: 79 year old Caucasian male with left pontine, right cerebellar and tiny left frontal and right parietal infarcts in May 2021 likely of cryptogenic etiology with 30-day cardiac event monitor negative for atrial fibrillation.  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  Vascular risk factors of hypertension, hyperlipidemia, diabetes and intracranial atherosclerosis.   -Recovering well with residual gait impairment, mild dysarthria and decreased right hand dexterity -Continue Plavix 75 mg daily and atorvastatin for secondary stroke prevention -Advised ongoing use of cane at all times for fall prevention unless otherwise instructed -Discussed secondary stroke prevention measures and importance of close PCP follow-up and maintain strict control of hypertension with blood pressure goal below 130/90, diabetes with hemoglobin A1c goal below 7.0 % and lipids with LDL cholesterol goal below 70 mg/dL.    Follow-up in 6 months or call earlier  if needed  CC:  [facility] provider: Dr. Conan Bowens, Roderic Palau, MD    I spent 30 minutes of face-to-face and non-face-to-face time with patient and wife.  This included previsit chart review, lab review, study review, order entry, electronic health record documentation, patient education and discussion regarding prior stroke of cryptogenic etiology, residual deficits, importance of managing stroke risk factors and answered all other questions to patient and wife satisfaction  Shanda Bumps  Caryn Section  [facility] [address] [place], [place] [address]  Phone [phone] Fax [phone] Note: This document was prepared with digital dictation and possible smart phrase technology. Any transcriptional errors that result from this process are unintentional.

## 2020-04-26 NOTE — Patient Instructions (Signed)
Continue to use your cane at all times unless otherwise instructed for fall prevention. Continue to do exercises at home   Continue clopidogrel 75 mg daily  and atorvastatin  for secondary stroke prevention  Continue to follow up with PCP regarding cholesterol, diabetes and blood pressure management  Maintain strict control of hypertension with blood pressure goal below 130/90, diabetes with hemoglobin A1c goal below 7.0% and cholesterol with LDL cholesterol (bad cholesterol) goal below 70 mg/dL.       Followup in the future with me in 6 months or call earlier if needed       Thank you for coming to see [place] at [facility]. I hope we have been able to provide you high quality care today.  You may receive a patient satisfaction survey over the next few weeks. We would appreciate your feedback and comments so that we may continue to improve ourselves and the health of our patients.

## 2020-07-15 DIAGNOSIS — Z23 Encounter for immunization: Secondary | ICD-10-CM | POA: Diagnosis not present

## 2020-07-15 DIAGNOSIS — Z8673 Personal history of transient ischemic attack (TIA), and cerebral infarction without residual deficits: Secondary | ICD-10-CM | POA: Diagnosis not present

## 2020-07-15 DIAGNOSIS — E1165 Type 2 diabetes mellitus with hyperglycemia: Secondary | ICD-10-CM | POA: Diagnosis not present

## 2020-07-15 DIAGNOSIS — Z125 Encounter for screening for malignant neoplasm of prostate: Secondary | ICD-10-CM | POA: Diagnosis not present

## 2020-07-15 DIAGNOSIS — E782 Mixed hyperlipidemia: Secondary | ICD-10-CM | POA: Diagnosis not present

## 2020-07-15 DIAGNOSIS — I10 Essential (primary) hypertension: Secondary | ICD-10-CM | POA: Diagnosis not present

## 2020-10-25 ENCOUNTER — Ambulatory Visit: Payer: PPO | Admitting: Adult Health

## 2020-10-25 ENCOUNTER — Encounter: Payer: Self-pay | Admitting: Adult Health

## 2020-10-25 NOTE — Progress Notes (Deleted)
[facility] [address] [place]. [place] [address] [phone]       OFFICE FOLLOW UP NOTE  Jordan Chang Date of Birth:  1940/07/08 Medical Record Number:  [id number]   Referring MD: Oneita Kras PA-C  Reason for Referral: Stroke  No chief complaint on file.    HPI:   Today, 10/25/2020, Jordan Chang returns for 46-month stroke follow-up  Stable since prior visit without new stroke/TIA symptoms and reports residual ***  Reports compliance on Plavix and atorvastatin without associated side effects Blood pressure today ***      History provided for reference purposes only Update 04/26/2020 JM: Jordan Chang returns for stroke follow-up accompanied by his wife.  He has been stable since prior visit with residual gait impairment and decreased right hand dexterity reporting improvement since prior visit.  Currently ambulating with a cane and denies any recent falls.  He continues to stay active doing all exercises at home as advised.  Denies new or worsening stroke/TIA symptoms.  Remains on Plavix and atorvastatin 40 mg daily for secondary stroke prevention without side effects.  Recent lipid panel showed LDL 69.  Blood pressure today 155/94.  Recent A1c 6.2. Cardiac monitor completed which was negative for atrial fibrillation. Per wife, patient received second dose of Pfizer vaccination at the end of March and questions if this could have contributed to his stroke.  No further concerns at this time.  Initial visit 01/06/2020 Dr. Pearlean Brownie Jordan Chang is a 80 year old Caucasian male seen today for initial office consultation visit for his stroke.  History is obtained from the patient, and his wife review of electronic medical records and have personally reviewed available imaging films in PACS.  He is accompanied today by his wife.  He has past medical history of diabetes, hypertension, chronic back pain, neuropathy who presented to [facility] on 10/26/2019  with slurred speech and right leg and arm weakness and facial droop and difficulty walking.  He presented beyond time window for TPA.  CT angiogram was obtained which showed no intracranial large vessel occlusion but did show left vertebral artery occlusion in the neck and at this sclerotic changes at both carotid bifurcations.  MRI scan of the brain showed an acute left pontine as well as a tiny punctate right cerebellar as well as left frontal and right parietal cortical infarcts.  Lower extremity venous Dopplers were negative for DVT.  Echocardiogram showed ejection fraction of 60 to 65%.  LDL cholesterol is 133 mg percent.  Hemoglobin A1c was 6.6.  Urine drug screen was negative.  Patient subsequently had a 30-day heart monitor done which is just sent back in the mail this week and results are yet elevated.  He had weakness and trouble walking and was transferred to inpatient rehab where he stayed for 2 and half weeks and made gradual improvement.  Is currently living at home.  Is able to ambulate with a walker.  He is able to do most activities for himself like bathing, brushing teeth and combing his hair and shaving.  His just finished home physical and occupational therapy but no outpatient therapy has been set up yet.  He is tolerating aspirin and Plavix well with only minor bruising and no bleeding.  His blood pressure is usually well controlled today it is borderline in office at 142/82.  He has no new complaints.    ROS:   14 system review of systems is positive for those listed in HPI and  all other systems negative   PMH:  Past Medical History:  Diagnosis Date  . GERD (gastroesophageal reflux disease)    occ  . Hyperlipemia   . Hypertension     Social History:  Social History   Socioeconomic History  . Marital status: Married    Spouse name: Not on file  . Number of children: Not on file  . Years of education: Not on file  . Highest education level: Not on file  Occupational  History  . Not on file  Tobacco Use  . Smoking status: Never Smoker  . Smokeless tobacco: Never Used  Vaping Use  . Vaping Use: Never used  Substance and Sexual Activity  . Alcohol use: Not Currently  . Drug use: No  . Sexual activity: Not Currently  Other Topics Concern  . Not on file  Social History Narrative  . Not on file   Social Determinants of Health   Financial Resource Strain: Not on file  Food Insecurity: Not on file  Transportation Needs: Not on file  Physical Activity: Not on file  Stress: Not on file  Social Connections: Not on file  Intimate Partner Violence: Not on file    Medications:   Current Outpatient Medications on File Prior to Visit  Medication Sig Dispense Refill  . acetaminophen (TYLENOL) 325 MG tablet Take 2 tablets (650 mg total) by mouth every 4 (four) hours as needed for mild pain (or temp > 37.5 C (99.5 F)).    Marland Kitchen atorvastatin (LIPITOR) 40 MG tablet Take 1 tablet (40 mg total) by mouth every other day. 30 tablet 0  . Cholecalciferol (VITAMIN D-3) 25 MCG (1000 UT) CAPS Take 1 capsule (1,000 Units total) by mouth in the morning and at bedtime. 60 capsule 0  . clopidogrel (PLAVIX) 75 MG tablet Take 1 tablet (75 mg total) by mouth daily. 30 tablet 0  . colchicine 0.6 MG tablet Take 1 tablet (0.6 mg total) by mouth 2 (two) times daily. 60 tablet 0  . docusate sodium (COLACE) 100 MG capsule Take 1 capsule (100 mg total) by mouth 2 (two) times daily. 60 capsule 0  . furosemide (LASIX) 20 MG tablet Take 1 tablet (20 mg total) by mouth at bedtime. 30 tablet 1  . loratadine (CLARITIN) 10 MG tablet Take 1 tablet (10 mg total) by mouth daily. 30 tablet 0  . losartan (COZAAR) 25 MG tablet Take 1 tablet by mouth daily.    . metFORMIN (GLUCOPHAGE) 500 MG tablet Take 1 tablet by mouth daily.    . methocarbamol (ROBAXIN) 500 MG tablet Take 1 tablet (500 mg total) by mouth every 8 (eight) hours as needed for muscle spasms. 60 tablet 0  . pantoprazole (PROTONIX) 40  MG tablet Take 1 tablet (40 mg total) by mouth in the morning and at bedtime. 30 tablet 0  . tamsulosin (FLOMAX) 0.4 MG CAPS capsule Take 1 capsule (0.4 mg total) by mouth at bedtime. 30 capsule 1  . vitamin B-12 (CYANOCOBALAMIN) 1000 MCG tablet Take 1 tablet (1,000 mcg total) by mouth daily. 30 tablet 0   No current facility-administered medications on file prior to visit.    Allergies:   Allergies  Allergen Reactions  . Atorvastatin Shortness Of Breath and Other (See Comments)    Dyspnea   . Other Palpitations and Other (See Comments)    STEROIDS caused tachycardia (exact one not recalled)    Physical Exam There were no vitals filed for this visit. There is no height  or weight on file to calculate BMI.   General: well developed, well nourished very pleasant elderly Caucasian male, seated, in no evident distress Head: head normocephalic and atraumatic.   Neck: supple with no carotid or supraclavicular bruits Cardiovascular: regular rate and rhythm, no murmurs Musculoskeletal: no deformity Skin:  no rash/petichiae Vascular:  Normal pulses all extremities  Neurologic Exam Mental Status: Awake and fully alert.  Mild dysarthria.  Oriented to place and time. Recent and remote memory intact. Attention span, concentration and fund of knowledge appropriate. Mood and affect appropriate.  Cranial Nerves: Pupils equal, briskly reactive to light. Extraocular movements full without nystagmus. Visual fields full to confrontation. Hearing diminished bilaterally. Facial sensation intact. Face, tongue, palate moves normally and symmetrically.  Motor: Normal bulk and tone. Normal strength in all tested extremity muscles. Sensory.: intact to touch , pinprick , position and vibratory sensation.  Coordination: Rapid alternating movements decreased right hand. Finger-to-nose and heel-to-shin performed accurately bilaterally.  Orbits left arm over right arm. Gait and Station: Arises from chair without  difficulty. Stance is wide-based. Gait demonstrates normal stride length and balance with use of cane.  Mild unsteadiness with turns.  Unable to perform heel and toe walk or tandem Reflexes: 1+ and symmetric. Toes downgoing.     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  Vascular risk factors of hypertension, hyperlipidemia, diabetes and intracranial atherosclerosis.   -Recovering well with residual gait impairment, mild dysarthria and decreased right hand dexterity -Continue Plavix 75 mg daily and atorvastatin for secondary stroke prevention -Advised ongoing use of cane at all times for fall prevention unless otherwise instructed -Discussed secondary stroke prevention measures and importance of close PCP follow-up and maintain strict control of hypertension with blood pressure goal below 130/90, diabetes with hemoglobin A1c goal below 7.0 % and lipids with LDL cholesterol goal below 70 mg/dL.    Follow-up in 6 months or call earlier if needed  CC:  [facility] provider: Dr. Avel Peace, MD     Ihor Austin, [facility]  [facility] [address] [place], [place] [address]  Phone [phone] Fax [phone] Note: This document was prepared with digital dictation and possible smart phrase technology. Any transcriptional errors that result from this process are unintentional.

## 2020-12-23 DIAGNOSIS — H9193 Unspecified hearing loss, bilateral: Secondary | ICD-10-CM | POA: Diagnosis not present

## 2020-12-23 DIAGNOSIS — I77810 Thoracic aortic ectasia: Secondary | ICD-10-CM | POA: Diagnosis not present

## 2020-12-23 DIAGNOSIS — E1165 Type 2 diabetes mellitus with hyperglycemia: Secondary | ICD-10-CM | POA: Diagnosis not present

## 2020-12-23 DIAGNOSIS — Z8673 Personal history of transient ischemic attack (TIA), and cerebral infarction without residual deficits: Secondary | ICD-10-CM | POA: Diagnosis not present

## 2020-12-23 DIAGNOSIS — I69351 Hemiplegia and hemiparesis following cerebral infarction affecting right dominant side: Secondary | ICD-10-CM | POA: Diagnosis not present

## 2020-12-23 DIAGNOSIS — M5442 Lumbago with sciatica, left side: Secondary | ICD-10-CM | POA: Diagnosis not present

## 2020-12-23 DIAGNOSIS — Z0000 Encounter for general adult medical examination without abnormal findings: Secondary | ICD-10-CM | POA: Diagnosis not present

## 2020-12-23 DIAGNOSIS — G8929 Other chronic pain: Secondary | ICD-10-CM | POA: Diagnosis not present

## 2020-12-23 DIAGNOSIS — I10 Essential (primary) hypertension: Secondary | ICD-10-CM | POA: Diagnosis not present

## 2021-04-19 DIAGNOSIS — Z8673 Personal history of transient ischemic attack (TIA), and cerebral infarction without residual deficits: Secondary | ICD-10-CM | POA: Diagnosis not present

## 2021-04-19 DIAGNOSIS — M5442 Lumbago with sciatica, left side: Secondary | ICD-10-CM | POA: Diagnosis not present

## 2021-04-19 DIAGNOSIS — Z0000 Encounter for general adult medical examination without abnormal findings: Secondary | ICD-10-CM | POA: Diagnosis not present

## 2021-04-19 DIAGNOSIS — H9193 Unspecified hearing loss, bilateral: Secondary | ICD-10-CM | POA: Diagnosis not present

## 2021-04-19 DIAGNOSIS — Z125 Encounter for screening for malignant neoplasm of prostate: Secondary | ICD-10-CM | POA: Diagnosis not present

## 2021-04-19 DIAGNOSIS — E1165 Type 2 diabetes mellitus with hyperglycemia: Secondary | ICD-10-CM | POA: Diagnosis not present

## 2021-04-19 DIAGNOSIS — G8929 Other chronic pain: Secondary | ICD-10-CM | POA: Diagnosis not present

## 2021-04-19 DIAGNOSIS — I10 Essential (primary) hypertension: Secondary | ICD-10-CM | POA: Diagnosis not present

## 2021-04-26 DIAGNOSIS — I10 Essential (primary) hypertension: Secondary | ICD-10-CM | POA: Diagnosis not present

## 2021-04-26 DIAGNOSIS — H9193 Unspecified hearing loss, bilateral: Secondary | ICD-10-CM | POA: Diagnosis not present

## 2021-04-26 DIAGNOSIS — Z6839 Body mass index (BMI) 39.0-39.9, adult: Secondary | ICD-10-CM | POA: Diagnosis not present

## 2021-04-26 DIAGNOSIS — E1165 Type 2 diabetes mellitus with hyperglycemia: Secondary | ICD-10-CM | POA: Diagnosis not present

## 2021-04-26 DIAGNOSIS — M5126 Other intervertebral disc displacement, lumbar region: Secondary | ICD-10-CM | POA: Diagnosis not present

## 2021-04-26 DIAGNOSIS — E782 Mixed hyperlipidemia: Secondary | ICD-10-CM | POA: Diagnosis not present

## 2021-04-26 DIAGNOSIS — D696 Thrombocytopenia, unspecified: Secondary | ICD-10-CM | POA: Diagnosis not present

## 2021-04-26 DIAGNOSIS — Z23 Encounter for immunization: Secondary | ICD-10-CM | POA: Diagnosis not present

## 2021-04-26 DIAGNOSIS — Z8673 Personal history of transient ischemic attack (TIA), and cerebral infarction without residual deficits: Secondary | ICD-10-CM | POA: Diagnosis not present

## 2021-08-18 DIAGNOSIS — Z8673 Personal history of transient ischemic attack (TIA), and cerebral infarction without residual deficits: Secondary | ICD-10-CM | POA: Diagnosis not present

## 2021-08-18 DIAGNOSIS — D696 Thrombocytopenia, unspecified: Secondary | ICD-10-CM | POA: Diagnosis not present

## 2021-08-18 DIAGNOSIS — Z6839 Body mass index (BMI) 39.0-39.9, adult: Secondary | ICD-10-CM | POA: Diagnosis not present

## 2021-08-18 DIAGNOSIS — E1165 Type 2 diabetes mellitus with hyperglycemia: Secondary | ICD-10-CM | POA: Diagnosis not present

## 2021-08-18 DIAGNOSIS — E782 Mixed hyperlipidemia: Secondary | ICD-10-CM | POA: Diagnosis not present

## 2021-08-18 DIAGNOSIS — I10 Essential (primary) hypertension: Secondary | ICD-10-CM | POA: Diagnosis not present

## 2021-08-18 DIAGNOSIS — M5126 Other intervertebral disc displacement, lumbar region: Secondary | ICD-10-CM | POA: Diagnosis not present

## 2021-08-18 DIAGNOSIS — H9193 Unspecified hearing loss, bilateral: Secondary | ICD-10-CM | POA: Diagnosis not present

## 2021-08-25 DIAGNOSIS — Z8673 Personal history of transient ischemic attack (TIA), and cerebral infarction without residual deficits: Secondary | ICD-10-CM | POA: Diagnosis not present

## 2021-08-25 DIAGNOSIS — R590 Localized enlarged lymph nodes: Secondary | ICD-10-CM | POA: Diagnosis not present

## 2021-08-25 DIAGNOSIS — D696 Thrombocytopenia, unspecified: Secondary | ICD-10-CM | POA: Diagnosis not present

## 2021-08-25 DIAGNOSIS — H918X3 Other specified hearing loss, bilateral: Secondary | ICD-10-CM | POA: Diagnosis not present

## 2021-08-25 DIAGNOSIS — I10 Essential (primary) hypertension: Secondary | ICD-10-CM | POA: Diagnosis not present

## 2021-08-25 DIAGNOSIS — E1165 Type 2 diabetes mellitus with hyperglycemia: Secondary | ICD-10-CM | POA: Diagnosis not present

## 2021-08-25 DIAGNOSIS — Z0000 Encounter for general adult medical examination without abnormal findings: Secondary | ICD-10-CM | POA: Diagnosis not present

## 2021-08-25 DIAGNOSIS — I77810 Thoracic aortic ectasia: Secondary | ICD-10-CM | POA: Diagnosis not present

## 2021-10-20 DIAGNOSIS — E1165 Type 2 diabetes mellitus with hyperglycemia: Secondary | ICD-10-CM | POA: Diagnosis not present

## 2021-10-20 DIAGNOSIS — J019 Acute sinusitis, unspecified: Secondary | ICD-10-CM | POA: Diagnosis not present

## 2021-10-20 DIAGNOSIS — H9193 Unspecified hearing loss, bilateral: Secondary | ICD-10-CM | POA: Diagnosis not present

## 2021-10-20 DIAGNOSIS — Z6838 Body mass index (BMI) 38.0-38.9, adult: Secondary | ICD-10-CM | POA: Diagnosis not present

## 2021-10-20 DIAGNOSIS — I10 Essential (primary) hypertension: Secondary | ICD-10-CM | POA: Diagnosis not present

## 2021-10-20 DIAGNOSIS — Z8673 Personal history of transient ischemic attack (TIA), and cerebral infarction without residual deficits: Secondary | ICD-10-CM | POA: Diagnosis not present

## 2022-01-05 DIAGNOSIS — R590 Localized enlarged lymph nodes: Secondary | ICD-10-CM | POA: Diagnosis not present

## 2022-01-05 DIAGNOSIS — D696 Thrombocytopenia, unspecified: Secondary | ICD-10-CM | POA: Diagnosis not present

## 2022-01-05 DIAGNOSIS — E1165 Type 2 diabetes mellitus with hyperglycemia: Secondary | ICD-10-CM | POA: Diagnosis not present

## 2022-01-05 DIAGNOSIS — I10 Essential (primary) hypertension: Secondary | ICD-10-CM | POA: Diagnosis not present

## 2022-02-24 DIAGNOSIS — H6981 Other specified disorders of Eustachian tube, right ear: Secondary | ICD-10-CM | POA: Diagnosis not present

## 2022-02-24 DIAGNOSIS — J019 Acute sinusitis, unspecified: Secondary | ICD-10-CM | POA: Diagnosis not present

## 2022-04-20 DIAGNOSIS — Z0000 Encounter for general adult medical examination without abnormal findings: Secondary | ICD-10-CM | POA: Diagnosis not present

## 2022-04-20 DIAGNOSIS — D696 Thrombocytopenia, unspecified: Secondary | ICD-10-CM | POA: Diagnosis not present

## 2022-04-20 DIAGNOSIS — Z8673 Personal history of transient ischemic attack (TIA), and cerebral infarction without residual deficits: Secondary | ICD-10-CM | POA: Diagnosis not present

## 2022-04-20 DIAGNOSIS — I10 Essential (primary) hypertension: Secondary | ICD-10-CM | POA: Diagnosis not present

## 2022-04-20 DIAGNOSIS — Z125 Encounter for screening for malignant neoplasm of prostate: Secondary | ICD-10-CM | POA: Diagnosis not present

## 2022-04-20 DIAGNOSIS — E1165 Type 2 diabetes mellitus with hyperglycemia: Secondary | ICD-10-CM | POA: Diagnosis not present

## 2022-04-20 DIAGNOSIS — E782 Mixed hyperlipidemia: Secondary | ICD-10-CM | POA: Diagnosis not present

## 2022-04-20 DIAGNOSIS — Z6838 Body mass index (BMI) 38.0-38.9, adult: Secondary | ICD-10-CM | POA: Diagnosis not present

## 2022-04-27 DIAGNOSIS — E7800 Pure hypercholesterolemia, unspecified: Secondary | ICD-10-CM | POA: Diagnosis not present

## 2022-04-27 DIAGNOSIS — D696 Thrombocytopenia, unspecified: Secondary | ICD-10-CM | POA: Diagnosis not present

## 2022-04-27 DIAGNOSIS — E1165 Type 2 diabetes mellitus with hyperglycemia: Secondary | ICD-10-CM | POA: Diagnosis not present

## 2022-04-27 DIAGNOSIS — Z23 Encounter for immunization: Secondary | ICD-10-CM | POA: Diagnosis not present

## 2022-04-27 DIAGNOSIS — Z0000 Encounter for general adult medical examination without abnormal findings: Secondary | ICD-10-CM | POA: Diagnosis not present

## 2022-04-27 DIAGNOSIS — H9193 Unspecified hearing loss, bilateral: Secondary | ICD-10-CM | POA: Diagnosis not present

## 2022-04-27 DIAGNOSIS — Z6839 Body mass index (BMI) 39.0-39.9, adult: Secondary | ICD-10-CM | POA: Diagnosis not present

## 2022-06-23 ENCOUNTER — Inpatient Hospital Stay
Admission: EM | Admit: 2022-06-23 | Discharge: 2022-06-27 | DRG: 158 | Disposition: A | Discharge status: Home / Self Care | Payer: No Typology Code available for payment source | Attending: Internal Medicine | Admitting: Internal Medicine

## 2022-06-23 ENCOUNTER — Encounter: Payer: Self-pay | Admitting: Emergency Medicine

## 2022-06-23 ENCOUNTER — Other Ambulatory Visit: Payer: Self-pay

## 2022-06-23 ENCOUNTER — Emergency Department: Payer: No Typology Code available for payment source

## 2022-06-23 DIAGNOSIS — E119 Type 2 diabetes mellitus without complications: Secondary | ICD-10-CM | POA: Diagnosis present

## 2022-06-23 DIAGNOSIS — K118 Other diseases of salivary glands: Secondary | ICD-10-CM | POA: Diagnosis not present

## 2022-06-23 DIAGNOSIS — Z7902 Long term (current) use of antithrombotics/antiplatelets: Secondary | ICD-10-CM | POA: Diagnosis not present

## 2022-06-23 DIAGNOSIS — L03211 Cellulitis of face: Secondary | ICD-10-CM | POA: Diagnosis not present

## 2022-06-23 DIAGNOSIS — Z888 Allergy status to other drugs, medicaments and biological substances status: Secondary | ICD-10-CM | POA: Diagnosis not present

## 2022-06-23 DIAGNOSIS — D696 Thrombocytopenia, unspecified: Secondary | ICD-10-CM | POA: Diagnosis present

## 2022-06-23 DIAGNOSIS — Z79899 Other long term (current) drug therapy: Secondary | ICD-10-CM | POA: Diagnosis not present

## 2022-06-23 DIAGNOSIS — Z1152 Encounter for screening for COVID-19: Secondary | ICD-10-CM

## 2022-06-23 DIAGNOSIS — R509 Fever, unspecified: Secondary | ICD-10-CM | POA: Diagnosis not present

## 2022-06-23 DIAGNOSIS — E785 Hyperlipidemia, unspecified: Secondary | ICD-10-CM | POA: Diagnosis not present

## 2022-06-23 DIAGNOSIS — Z7984 Long term (current) use of oral hypoglycemic drugs: Secondary | ICD-10-CM | POA: Diagnosis not present

## 2022-06-23 DIAGNOSIS — I10 Essential (primary) hypertension: Secondary | ICD-10-CM | POA: Diagnosis present

## 2022-06-23 DIAGNOSIS — K047 Periapical abscess without sinus: Principal | ICD-10-CM | POA: Diagnosis present

## 2022-06-23 DIAGNOSIS — R531 Weakness: Principal | ICD-10-CM

## 2022-06-23 DIAGNOSIS — L0201 Cutaneous abscess of face: Secondary | ICD-10-CM | POA: Diagnosis not present

## 2022-06-23 DIAGNOSIS — R0689 Other abnormalities of breathing: Secondary | ICD-10-CM | POA: Diagnosis not present

## 2022-06-23 DIAGNOSIS — R609 Edema, unspecified: Secondary | ICD-10-CM | POA: Diagnosis not present

## 2022-06-23 LAB — COMPREHENSIVE METABOLIC PANEL
ALT: 40 U/L (ref 0–44)
AST: 48 U/L — ABNORMAL HIGH (ref 15–41)
Albumin: 3.9 g/dL (ref 3.5–5.0)
Alkaline Phosphatase: 71 U/L (ref 38–126)
Anion gap: 10 (ref 5–15)
BUN: 26 mg/dL — ABNORMAL HIGH (ref 8–23)
CO2: 22 mmol/L (ref 22–32)
Calcium: 9.2 mg/dL (ref 8.9–10.3)
Chloride: 107 mmol/L (ref 98–111)
Creatinine, Ser: 1.22 mg/dL (ref 0.61–1.24)
GFR, Estimated: 59 mL/min — ABNORMAL LOW (ref >60)
Glucose, Bld: 121 mg/dL — ABNORMAL HIGH (ref 70–99)
Potassium: 3.8 mmol/L (ref 3.5–5.1)
Sodium: 139 mmol/L (ref 135–145)
Total Bilirubin: 1.7 mg/dL — ABNORMAL HIGH (ref 0.3–1.2)
Total Protein: 7.3 g/dL (ref 6.5–8.1)

## 2022-06-23 LAB — URINALYSIS, ROUTINE W REFLEX MICROSCOPIC
Bilirubin Urine: NEGATIVE
Glucose, UA: NEGATIVE mg/dL
Hgb urine dipstick: NEGATIVE
Ketones, ur: NEGATIVE mg/dL
Leukocytes,Ua: NEGATIVE
Nitrite: NEGATIVE
Protein, ur: NEGATIVE mg/dL
Specific Gravity, Urine: >1.046 — ABNORMAL HIGH (ref 1.005–1.030)
pH: 5 (ref 5.0–8.0)

## 2022-06-23 LAB — SEDIMENTATION RATE: Sed Rate: 1 mm/hr (ref 0–20)

## 2022-06-23 LAB — RESP PANEL BY RT-PCR (RSV, FLU A&B, COVID)  RVPGX2
Influenza A by PCR: NEGATIVE
Influenza B by PCR: NEGATIVE
Resp Syncytial Virus by PCR: NEGATIVE
SARS Coronavirus 2 by RT PCR: NEGATIVE

## 2022-06-23 LAB — CBC WITH DIFFERENTIAL/PLATELET
Abs Immature Granulocytes: 0.05 10*3/uL (ref 0.00–0.07)
Basophils Absolute: 0 10*3/uL (ref 0.0–0.1)
Basophils Relative: 0 %
Eosinophils Absolute: 0.1 10*3/uL (ref 0.0–0.5)
Eosinophils Relative: 1 %
HCT: 43.8 % (ref 39.0–52.0)
Hemoglobin: 14.8 g/dL (ref 13.0–17.0)
Immature Granulocytes: 1 %
Lymphocytes Relative: 10 %
Lymphs Abs: 1 10*3/uL (ref 0.7–4.0)
MCH: 31.7 pg (ref 26.0–34.0)
MCHC: 33.8 g/dL (ref 30.0–36.0)
MCV: 93.8 fL (ref 80.0–100.0)
Monocytes Absolute: 1.1 10*3/uL — ABNORMAL HIGH (ref 0.1–1.0)
Monocytes Relative: 11 %
Neutro Abs: 7.3 10*3/uL (ref 1.7–7.7)
Neutrophils Relative %: 77 %
Platelets: 126 10*3/uL — ABNORMAL LOW (ref 150–400)
RBC: 4.67 MIL/uL (ref 4.22–5.81)
RDW: 13.6 % (ref 11.5–15.5)
WBC: 9.5 10*3/uL (ref 4.0–10.5)
nRBC: 0 % (ref 0.0–0.2)

## 2022-06-23 LAB — LACTIC ACID, PLASMA
Lactic Acid, Venous: 2.4 mmol/L (ref 0.5–1.9)
Lactic Acid, Venous: 1.7 mmol/L (ref 0.5–1.9)

## 2022-06-23 MED ORDER — INSULIN ASPART 100 UNIT/ML IJ SOLN: 0.0000 [IU] | Freq: Every day | INTRAMUSCULAR | Status: DC

## 2022-06-23 MED ORDER — SODIUM CHLORIDE 0.9 % IV SOLN
3.0000 g | Freq: Four times a day (QID) | INTRAVENOUS | Status: AC
  Administered 2022-06-24 (×4): 3 g via INTRAVENOUS
  Filled 2022-06-23 (×3): qty 8
  Filled 2022-06-23: qty 3

## 2022-06-23 MED ORDER — TRAZODONE HCL 50 MG PO TABS
25.0000 mg | ORAL_TABLET | Freq: Every evening | ORAL | Status: DC | PRN
  Administered 2022-06-26: 25 mg via ORAL
  Filled 2022-06-23: qty 1

## 2022-06-23 MED ORDER — MAGNESIUM HYDROXIDE 400 MG/5ML PO SUSP: 30.0000 mL | Freq: Every day | ORAL | Status: DC | PRN

## 2022-06-23 MED ORDER — ONDANSETRON HCL 4 MG PO TABS: 4.0000 mg | ORAL_TABLET | Freq: Four times a day (QID) | ORAL | Status: DC | PRN

## 2022-06-23 MED ORDER — LOSARTAN POTASSIUM 50 MG PO TABS
50.0000 mg | ORAL_TABLET | Freq: Every day | ORAL | Status: DC
  Administered 2022-06-24 – 2022-06-27 (×4): 50 mg via ORAL
  Filled 2022-06-23 (×4): qty 1

## 2022-06-23 MED ORDER — IOHEXOL 300 MG/ML  SOLN
75.0000 mL | Freq: Once | INTRAMUSCULAR | Status: AC | PRN
  Administered 2022-06-23: 75 mL via INTRAVENOUS

## 2022-06-23 MED ORDER — ACETAMINOPHEN 325 MG PO TABS
650.0000 mg | ORAL_TABLET | Freq: Four times a day (QID) | ORAL | Status: DC | PRN
  Administered 2022-06-24 – 2022-06-26 (×7): 650 mg via ORAL
  Filled 2022-06-23 (×7): qty 2

## 2022-06-23 MED ORDER — ACETAMINOPHEN 650 MG RE SUPP: 650.0000 mg | Freq: Four times a day (QID) | RECTAL | Status: DC | PRN

## 2022-06-23 MED ORDER — ACETAMINOPHEN 325 MG PO TABS
650.0000 mg | ORAL_TABLET | Freq: Once | ORAL | Status: AC | PRN
  Administered 2022-06-23: 650 mg via ORAL
  Filled 2022-06-23: qty 2

## 2022-06-23 MED ORDER — CLOPIDOGREL BISULFATE 75 MG PO TABS
75.0000 mg | ORAL_TABLET | Freq: Every day | ORAL | Status: DC
  Administered 2022-06-24 – 2022-06-27 (×4): 75 mg via ORAL
  Filled 2022-06-23 (×4): qty 1

## 2022-06-23 MED ORDER — ATORVASTATIN CALCIUM 20 MG PO TABS
40.0000 mg | ORAL_TABLET | ORAL | Status: DC
  Administered 2022-06-24 – 2022-06-26 (×2): 40 mg via ORAL
  Filled 2022-06-23 (×3): qty 2

## 2022-06-23 MED ORDER — SODIUM CHLORIDE 0.9 % IV BOLUS
1000.0000 mL | Freq: Once | INTRAVENOUS | Status: AC
  Administered 2022-06-23: 1000 mL via INTRAVENOUS

## 2022-06-23 MED ORDER — ENOXAPARIN SODIUM 80 MG/0.8ML IJ SOSY
0.5000 mg/kg | PREFILLED_SYRINGE | INTRAMUSCULAR | Status: DC
  Administered 2022-06-24 – 2022-06-27 (×4): 62.5 mg via SUBCUTANEOUS
  Filled 2022-06-23 (×4): qty 0.63

## 2022-06-23 MED ORDER — INSULIN ASPART 100 UNIT/ML IJ SOLN
0.0000 [IU] | Freq: Three times a day (TID) | INTRAMUSCULAR | Status: DC
  Filled 2022-06-23: qty 1

## 2022-06-23 MED ORDER — SODIUM CHLORIDE 0.9 % IV SOLN: INTRAVENOUS | Status: DC

## 2022-06-23 MED ORDER — SODIUM CHLORIDE 0.9 % IV SOLN
3.0000 g | Freq: Once | INTRAVENOUS | Status: AC
  Administered 2022-06-23: 3 g via INTRAVENOUS
  Filled 2022-06-23: qty 8

## 2022-06-23 MED ORDER — ONDANSETRON HCL 4 MG/2ML IJ SOLN: 4.0000 mg | Freq: Four times a day (QID) | INTRAMUSCULAR | Status: DC | PRN

## 2022-06-23 MED ORDER — TIZANIDINE HCL 2 MG PO TABS: 2.0000 mg | ORAL_TABLET | Freq: Three times a day (TID) | ORAL | Status: DC | PRN

## 2022-06-23 MED ORDER — CARVEDILOL 6.25 MG PO TABS
3.1250 mg | ORAL_TABLET | Freq: Two times a day (BID) | ORAL | Status: DC
  Administered 2022-06-24 – 2022-06-27 (×7): 3.125 mg via ORAL
  Filled 2022-06-23 (×7): qty 1

## 2022-06-23 NOTE — H&P (Signed)
[place]   PATIENT NAME: Jordan Chang    MR#:  [id number]  DATE OF BIRTH:  [birth year]  DATE OF ADMISSION:  06/23/2022  PRIMARY CARE PHYSICIAN: Barbette Reichmann, MD   Patient is coming from: Home  REQUESTING/REFERRING PHYSICIAN: Dorothea Glassman, MD  CHIEF COMPLAINT:   Chief Complaint  Patient presents with   Weakness    HISTORY OF PRESENT ILLNESS:  Jordan Chang is a 81 y.o. Caucasian male with medical history significant for essential hypertension, dyslipidemia and GERD, who presented to the emergency room with acute onset of generalized weakness and a fall in the bathroom where he could not get up again.  He had a tooth pulled on 12/26 and since then has been having jaw swelling and redness on the neck.  He feels it is getting worse.  He was having low-grade fever of 100.3 with tachycardia.  He denied any nausea or vomiting or abdominal pain.  No chest pain or palpitations.  No cough or wheezing or dyspnea.  No dysuria, oliguria or hematuria 5 PM.   ED Course: When he came to the ER, BP was 158/86 with temperature 100.3/37.9 heart rate 107 with respiratory rate of 20.  Labs revealed lactic acid 2.4 then 1.7 and CBC that was remarkable for thrombocytopenia of 126.  Sed rate was 1 and CMP was remarkable for BUN of 26 and AST 48 with total bili 1.7.   EKG as reviewed by me :EKG showed sinus tachycardia with a rate of 108 with right bundle branch block and T wave inversion in V1 and lead III. Imaging: CT soft tissue neck with contrast revealed: 1. Posterior right mandibular molar extraction site with odontogenic abscess in the retromolar trigone measuring 2.3 x 2.5 cm. 2.  Aortic Atherosclerosis .  The patient was given 3 grams of IV Unasyn, 1 L bolus of IV normal saline and 650 mg of p.o. Tylenol.  He will be admitted to a medical bed for further evaluation and management. PAST MEDICAL HISTORY:   Past Medical History:  Diagnosis Date   GERD (gastroesophageal reflux  disease)    occ   Hyperlipemia    Hypertension     PAST SURGICAL HISTORY:   Past Surgical History:  Procedure Laterality Date   BACK SURGERY  05/2011   EYE SURGERY     cataracts   LUMBAR LAMINECTOMY/DECOMPRESSION MICRODISCECTOMY Left 01/20/2016   Procedure: Left Lumbar two-three, Lumbar three-four,  Lumbar four-five Microdiskectomy;  Surgeon: Tressie Stalker, MD;  Location: [facility];  Service: Neurosurgery;  Laterality: Left;    SOCIAL HISTORY:   Social History   Tobacco Use   Smoking status: Never   Smokeless tobacco: Never  Substance Use Topics   Alcohol use: Not Currently    FAMILY HISTORY:  History reviewed. No pertinent family history.  DRUG ALLERGIES:   Allergies  Allergen Reactions   Atorvastatin Shortness Of Breath and Other (See Comments)    Dyspnea    Other Palpitations and Other (See Comments)    STEROIDS caused tachycardia (exact one not recalled)    REVIEW OF SYSTEMS:   ROS As per history of present illness. All pertinent systems were reviewed above. Constitutional, HEENT, cardiovascular, respiratory, GI, GU, musculoskeletal, neuro, psychiatric, endocrine, integumentary and hematologic systems were reviewed and are otherwise negative/unremarkable except for positive findings mentioned above in the HPI.   MEDICATIONS AT HOME:   Prior to Admission medications   Medication Sig Start Date End Date Taking? Authorizing Provider  atorvastatin (LIPITOR) 40 MG tablet Take 1 tablet (40 mg total) by mouth every other day. 11/12/19 06/23/22 Yes Angiulli, Mcarthur Rossetti, PA-C  carvedilol (COREG) 3.125 MG tablet Take 3.125 mg by mouth 2 (two) times daily with a meal. 05/15/22  Yes [provider]  clopidogrel (PLAVIX) 75 MG tablet Take 1 tablet (75 mg total) by mouth daily. 11/12/19  Yes Angiulli, Mcarthur Rossetti, PA-C  losartan (COZAAR) 50 MG tablet Take 50 mg by mouth daily.   Yes [provider]  metFORMIN (GLUCOPHAGE-XR) 500 MG 24 hr tablet Take 500 mg  by mouth daily. 06/07/22  Yes [provider]  tiZANidine (ZANAFLEX) 2 MG tablet Take 2 mg by mouth 3 (three) times daily. 05/01/22  Yes [provider]  acetaminophen (TYLENOL) 325 MG tablet Take 2 tablets (650 mg total) by mouth every 4 (four) hours as needed for mild pain (or temp > 37.5 C (99.5 F)). 11/12/19   Angiulli, Mcarthur Rossetti, PA-C      VITAL SIGNS:  Blood pressure (!) 164/72, pulse 100, temperature 100 F (37.8 C), temperature source Oral, resp. rate 16, height 6' (1.829 m), weight 126.6 kg, SpO2 95 %.  PHYSICAL EXAMINATION:  Physical Exam  GENERAL:  81 y.o.-year-old Caucasian male patient lying in the bed with no acute distress.  EYES: Pupils equal, round, reactive to light and accommodation. No scleral icterus. Extraocular muscles intact.  HEENT: Head atraumatic, normocephalic. Oropharynx with lower show odontogenic tender swelling and nasopharynx clear.  NECK:  Supple, no jugular venous distention. No thyroid enlargement, no tenderness.  LUNGS: Normal breath sounds bilaterally, no wheezing, rales,rhonchi or crepitation. No use of accessory muscles of respiration.  CARDIOVASCULAR: Regular rate and rhythm, S1, S2 normal. No murmurs, rubs, or gallops.  ABDOMEN: Soft, nondistended, nontender. Bowel sounds present. No organomegaly or mass.  EXTREMITIES: No pedal edema, cyanosis, or clubbing.  NEUROLOGIC: Cranial nerves II through XII are intact. Muscle strength 5/5 in all extremities. Sensation intact. Gait not checked.  PSYCHIATRIC: The patient is alert and oriented x 3.  Normal affect and good eye contact. SKIN: Right lower facial and submandibular erythematous swelling with warmth, induration and tenderness.  LABORATORY PANEL:   CBC Recent Labs  Lab 06/23/22 1804  WBC 9.5  HGB 14.8  HCT 43.8  PLT 126*   ------------------------------------------------------------------------------------------------------------------  Chemistries  Recent Labs  Lab  06/23/22 1804  NA 139  K 3.8  CL 107  CO2 22  GLUCOSE 121*  BUN 26*  CREATININE 1.22  CALCIUM 9.2  AST 48*  ALT 40  ALKPHOS 71  BILITOT 1.7*   ------------------------------------------------------------------------------------------------------------------  Cardiac Enzymes No results for input(s): "TROPONINI" in the last 168 hours. ------------------------------------------------------------------------------------------------------------------  RADIOLOGY:  No results found.    IMPRESSION AND PLAN:  Assessment and Plan: * Generalized weakness - This could be related to early sepsis though he is not meeting criteria currently. - He will be admitted to a medical bed. - He will be hydrated with IV normal saline. - PT evaluation will be obtained.  Facial cellulitis - This is likely secondary to his recent tooth abscess. - We will continue him on IV Unasyn. - Warm compresses will be utilized with - We will follow blood cultures. - Pain management to be provided. - He is not septic yet but fairly close. - This is likely contributing to #1.  Type 2 diabetes mellitus without complications (HCC) - The patient will be placed on supplemental coverage with NovoLog. - We will hold off metformin.  Dyslipidemia -  We will continue statin therapy.  Essential hypertension - We will continue his antihypertensives.   DVT prophylaxis: Lovenox.  Advanced Care Planning:  Code Status: full code.  Family Communication:  The plan of care was discussed in details with the patient (and family). I answered all questions. The patient agreed to proceed with the above mentioned plan. Further management will depend upon hospital course. Disposition Plan: Back to previous home environment Consults called: none.  All the records are reviewed and case discussed with ED provider.  Status is: Inpatient   At the time of the admission, it appears that the appropriate admission status for  this patient is inpatient.  This is judged to be reasonable and necessary in order to provide the required intensity of service to ensure the patient's safety given the presenting symptoms, physical exam findings and initial radiographic and laboratory data in the context of comorbid conditions.  The patient requires inpatient status due to high intensity of service, high risk of further deterioration and high frequency of surveillance required.  I certify that at the time of admission, it is my clinical judgment that the patient will require inpatient hospital care extending more than 2 midnights.                            Dispo: The patient is from: Home              Anticipated d/c is to: Home              Patient currently is not medically stable to d/c.              Difficult to place patient: No  Hannah Beat M.D on 06/23/2022 at 11:19 PM  [facility]   From 7 PM-7 AM, contact night-coverage [email or web address]  CC: Primary care physician; Barbette Reichmann, MD

## 2022-06-23 NOTE — Assessment & Plan Note (Signed)
-   The patient will be placed on supplemental coverage with NovoLog. - We will hold off metformin. 

## 2022-06-23 NOTE — ED Notes (Signed)
Pt ambulated to bathroom and back to bed by assistance of wife.

## 2022-06-23 NOTE — Assessment & Plan Note (Signed)
-   We will continue statin therapy. 

## 2022-06-23 NOTE — Assessment & Plan Note (Signed)
-   This is likely secondary to his recent tooth abscess. - We will continue him on IV Unasyn. - Warm compresses will be utilized with - We will follow blood cultures. - Pain management to be provided. - He is not septic yet but fairly close. - This is likely contributing to #1.

## 2022-06-23 NOTE — ED Notes (Signed)
First Nurse Note: Pt to ED via [facility] from home for weakness that started today. Pt fell in the bathroom and was unable to stand up. Pt also c/o Jaw pain. Pt has tooth taken out on 12/26. Pt is not on abx. Pt has jaw swelling and redness on neck that has been there since he had tooth removed.  99.2 temp 125 CBG 101 HR 95% on RA 31 CO2 166/81- BP 20 G LAC.

## 2022-06-23 NOTE — ED Provider Notes (Signed)
[facility] Provider Note    Event Date/Time   First MD Initiated Contact with Patient 06/23/22 2115     (approximate)   History   Weakness   HPI  Jordan Chang is a 81 y.o. male who had a tooth taken out on 26 December.  He has had jaw swelling and redness on the neck since then.  Possibly is getting worse.  Today he had some weakness.  He fell in the bathroom and could not get back up again.  His wife could not get him up again either.  He feels better now.  He still somewhat tachycardic and has a low-grade fever of 100.3      Physical Exam   Triage Vital Signs: ED Triage Vitals  Enc Vitals Group     BP 06/23/22 1802 (!) 158/86     Pulse Rate 06/23/22 1802 (!) 107     Resp 06/23/22 1802 20     Temp 06/23/22 1802 100.3 F (37.9 C)     Temp Source 06/23/22 1802 Oral     SpO2 06/23/22 1802 95 %     Weight 06/23/22 1805 279 lb (126.6 kg)     Height 06/23/22 1805 6' (1.829 m)     Head Circumference --      Peak Flow --      Pain Score 06/23/22 2053 5     Pain Loc --      Pain Edu? --      Excl. in GC? --     Most recent vital signs: Vitals:   06/23/22 1802 06/23/22 2241  BP: (!) 158/86 (!) 164/72  Pulse: (!) 107 100  Resp: 20 16  Temp: 100.3 F (37.9 C) 100 F (37.8 C)  SpO2: 95% 95%     General: Awake, no distress.  Head normocephalic atraumatic except for a right-sided red swollen and tender area about the size of a half a grapefruit centered over the right side of the lower jaw. CV:  Good peripheral perfusion.  Heart regular rate and rhythm no audible murmurs Resp:  Normal effort.  Lungs are clear Abd:  No distention.     ED Results / Procedures / Treatments   Labs (all labs ordered are listed, but only abnormal results are displayed) Labs Reviewed  LACTIC ACID, PLASMA - Abnormal; Notable for the following components:      Result Value   Lactic Acid, Venous 2.4 (*)    All other components within normal limits   COMPREHENSIVE METABOLIC PANEL - Abnormal; Notable for the following components:   Glucose, Bld 121 (*)    BUN 26 (*)    AST 48 (*)    Total Bilirubin 1.7 (*)    GFR, Estimated 59 (*)    All other components within normal limits  CBC WITH DIFFERENTIAL/PLATELET - Abnormal; Notable for the following components:   Platelets 126 (*)    Monocytes Absolute 1.1 (*)    All other components within normal limits  URINALYSIS, ROUTINE W REFLEX MICROSCOPIC - Abnormal; Notable for the following components:   Color, Urine YELLOW (*)    APPearance CLEAR (*)    Specific Gravity, Urine >1.046 (*)    All other components within normal limits  RESP PANEL BY RT-PCR (RSV, FLU A&B, COVID)  [id number]  CULTURE, BLOOD (SINGLE)  LACTIC ACID, PLASMA  SEDIMENTATION RATE  HEMOGLOBIN A1C  BASIC METABOLIC PANEL  CBC     EKG  EKG read interpreted by me  shows sinus tach rate 108 normal axis right bundle branch block no acute ST-T changes   RADIOLOGY CT of the face shows a tooth abscess with a lot of surrounding swelling read by radiology reviewed and interpreted by me   PROCEDURES:  Critical Care performed:   Procedures   MEDICATIONS ORDERED IN ED: Medications  atorvastatin (LIPITOR) tablet 40 mg (has no administration in time range)  carvedilol (COREG) tablet 3.125 mg (has no administration in time range)  losartan (COZAAR) tablet 50 mg (has no administration in time range)  clopidogrel (PLAVIX) tablet 75 mg (has no administration in time range)  tiZANidine (ZANAFLEX) tablet 2 mg (has no administration in time range)  insulin aspart (novoLOG) injection 0-9 Units (has no administration in time range)  insulin aspart (novoLOG) injection 0-5 Units ( Subcutaneous Not Given 06/23/22 2302)  enoxaparin (LOVENOX) injection 62.5 mg (has no administration in time range)  0.9 %  sodium chloride infusion ( Intravenous New Bag/Given 06/23/22 2344)  acetaminophen (TYLENOL) tablet 650 mg (has no administration in  time range)    Or  acetaminophen (TYLENOL) suppository 650 mg (has no administration in time range)  traZODone (DESYREL) tablet 25 mg (has no administration in time range)  magnesium hydroxide (MILK OF MAGNESIA) suspension 30 mL (has no administration in time range)  ondansetron (ZOFRAN) tablet 4 mg (has no administration in time range)    Or  ondansetron (ZOFRAN) injection 4 mg (has no administration in time range)  Ampicillin-Sulbactam (UNASYN) 3 g in sodium chloride 0.9 % 100 mL IVPB (3 g Intravenous Not Given 06/23/22 2338)  acetaminophen (TYLENOL) tablet 650 mg (650 mg Oral Given 06/23/22 1809)  iohexol (OMNIPAQUE) 300 MG/ML solution 75 mL (75 mLs Intravenous Contrast Given 06/23/22 1846)  Ampicillin-Sulbactam (UNASYN) 3 g in sodium chloride 0.9 % 100 mL IVPB (0 g Intravenous Stopped 06/23/22 2242)  sodium chloride 0.9 % bolus 1,000 mL (0 mLs Intravenous Stopped 06/23/22 2302)     IMPRESSION / MDM / ASSESSMENT AND PLAN / ED COURSE  I reviewed the triage vital signs and the nursing notes. Patient comes from home.  At home he is very weak he had difficulty walking fell and could not get up.  He has a lot of facial swelling.  He is at high risk for progression.  About half of the submental space is swollen and firm.  Differential diagnosis includes, but is not limited to, tooth abscess with facial swelling and possible early sepsis  Patient's presentation is most consistent with acute presentation with potential threat to life or bodily function.  The patient is on the cardiac monitor to evaluate for evidence of arrhythmia and/or significant heart rate changes.  None were seen     FINAL CLINICAL IMPRESSION(S) / ED DIAGNOSES   Final diagnoses:  Generalized weakness  Tooth abscess     Rx / DC Orders   ED Discharge Orders     None        Note:  This document was prepared using Dragon voice recognition software and may include unintentional dictation errors.   Arnaldo Natal, MD 06/24/22 [id number]

## 2022-06-23 NOTE — ED Provider Triage Note (Signed)
Emergency Medicine Provider Triage Evaluation Note  MARCHELLO ROTHGEB , a 81 y.o. male  was evaluated in triage.  Pt complains of fever, weakness, swelling from area where tooth was extracted on Tuesday, no v/d, no cp.  Review of Systems  Positive:  Negative:   Physical Exam  BP (!) 158/86 (BP Location: Left Arm)   Pulse (!) 107   Temp 100.3 F (37.9 C) (Oral)   Resp 20   Ht 6' (1.829 m)   Wt 126.6 kg   SpO2 95%   BMI 37.84 kg/m  Gen:   Awake, no distress   Resp:  Normal effort  MSK:   Moves extremities without difficulty  Other:  Large amount of swelling noted at right mandible with redness anteriorly on the neck, no significant swelling noted under the tongue to indicate ludwig's angina  Medical Decision Making  Medically screening exam initiated at 6:12 PM.  Appropriate orders placed.  Dayton Bailiff was informed that the remainder of the evaluation will be completed by another provider, this initial triage assessment does not replace that evaluation, and the importance of remaining in the ED until their evaluation is complete.     Faythe Ghee, PA-C 06/23/22 1813

## 2022-06-23 NOTE — Progress Notes (Signed)
PHARMACIST - PHYSICIAN COMMUNICATION  CONCERNING:  Enoxaparin (Lovenox) for DVT Prophylaxis    RECOMMENDATION: Patient was prescribed enoxaprin 40mg  q24 hours for VTE prophylaxis.   Filed Weights   06/23/22 1805  Weight: 126.6 kg (279 lb)    Body mass index is 37.84 kg/m.  Estimated Creatinine Clearance: 63.1 mL/min (by C-G formula based on SCr of 1.22 mg/dL).   Based on [facility] policy patient is candidate for enoxaparin 0.5mg /kg TBW SQ every 24 hours based on BMI being >30.  DESCRIPTION: Pharmacy has adjusted enoxaparin dose per [facility] policy.  Patient is now receiving enoxaparin 0.5 mg/kg every 24 hours   [facility], PharmD, [facility] 06/23/2022 11:05 PM

## 2022-06-23 NOTE — Assessment & Plan Note (Signed)
-   We will continue his antihypertensives. 

## 2022-06-23 NOTE — Assessment & Plan Note (Signed)
-   This could be related to early sepsis though he is not meeting criteria currently. - He will be admitted to a medical bed. - He will be hydrated with IV normal saline. - PT evaluation will be obtained.

## 2022-06-24 DIAGNOSIS — K047 Periapical abscess without sinus: Secondary | ICD-10-CM

## 2022-06-24 DIAGNOSIS — R531 Weakness: Secondary | ICD-10-CM | POA: Diagnosis not present

## 2022-06-24 DIAGNOSIS — L03211 Cellulitis of face: Secondary | ICD-10-CM | POA: Diagnosis not present

## 2022-06-24 LAB — GLUCOSE, CAPILLARY: Glucose-Capillary: 98 mg/dL (ref 70–99)

## 2022-06-24 LAB — BLOOD CULTURE ID PANEL (REFLEXED) - BCID2
A.calcoaceticus-baumannii: NOT DETECTED
Bacteroides fragilis: NOT DETECTED
Candida albicans: NOT DETECTED
Candida auris: NOT DETECTED
Candida glabrata: NOT DETECTED
Candida krusei: NOT DETECTED
Candida parapsilosis: NOT DETECTED
Candida tropicalis: NOT DETECTED
Cryptococcus neoformans/gattii: NOT DETECTED
Enterobacter cloacae complex: NOT DETECTED
Enterobacterales: NOT DETECTED
Enterococcus Faecium: NOT DETECTED
Enterococcus faecalis: NOT DETECTED
Escherichia coli: NOT DETECTED
Haemophilus influenzae: NOT DETECTED
Klebsiella aerogenes: NOT DETECTED
Klebsiella oxytoca: NOT DETECTED
Klebsiella pneumoniae: NOT DETECTED
Listeria monocytogenes: NOT DETECTED
Methicillin resistance mecA/C: NOT DETECTED
Neisseria meningitidis: NOT DETECTED
Proteus species: NOT DETECTED
Pseudomonas aeruginosa: NOT DETECTED
Salmonella species: NOT DETECTED
Serratia marcescens: NOT DETECTED
Staphylococcus aureus (BCID): NOT DETECTED
Staphylococcus epidermidis: DETECTED — AB
Staphylococcus lugdunensis: NOT DETECTED
Staphylococcus species: DETECTED — AB
Stenotrophomonas maltophilia: NOT DETECTED
Streptococcus agalactiae: NOT DETECTED
Streptococcus pneumoniae: NOT DETECTED
Streptococcus pyogenes: NOT DETECTED
Streptococcus species: DETECTED — AB

## 2022-06-24 LAB — CBG MONITORING, ED
Glucose-Capillary: 128 mg/dL — ABNORMAL HIGH (ref 70–99)
Glucose-Capillary: 100 mg/dL — ABNORMAL HIGH (ref 70–99)
Glucose-Capillary: 112 mg/dL — ABNORMAL HIGH (ref 70–99)

## 2022-06-24 LAB — BASIC METABOLIC PANEL
Anion gap: 8 (ref 5–15)
BUN: 23 mg/dL (ref 8–23)
CO2: 22 mmol/L (ref 22–32)
Calcium: 8.3 mg/dL — ABNORMAL LOW (ref 8.9–10.3)
Chloride: 110 mmol/L (ref 98–111)
Creatinine, Ser: 1.03 mg/dL (ref 0.61–1.24)
GFR, Estimated: >60 mL/min (ref >60)
Glucose, Bld: 116 mg/dL — ABNORMAL HIGH (ref 70–99)
Potassium: 3.5 mmol/L (ref 3.5–5.1)
Sodium: 140 mmol/L (ref 135–145)

## 2022-06-24 LAB — CBC
HCT: 38.5 % — ABNORMAL LOW (ref 39.0–52.0)
Hemoglobin: 12.8 g/dL — ABNORMAL LOW (ref 13.0–17.0)
MCH: 31.2 pg (ref 26.0–34.0)
MCHC: 33.2 g/dL (ref 30.0–36.0)
MCV: 93.9 fL (ref 80.0–100.0)
Platelets: 115 10*3/uL — ABNORMAL LOW (ref 150–400)
RBC: 4.1 MIL/uL — ABNORMAL LOW (ref 4.22–5.81)
RDW: 13.7 % (ref 11.5–15.5)
WBC: 8.6 10*3/uL (ref 4.0–10.5)
nRBC: 0 % (ref 0.0–0.2)

## 2022-06-24 MED ORDER — MORPHINE SULFATE (PF) 2 MG/ML IV SOLN
2.0000 mg | INTRAVENOUS | Status: DC | PRN
  Administered 2022-06-24: 2 mg via INTRAVENOUS
  Filled 2022-06-24: qty 1

## 2022-06-24 MED ORDER — KETOROLAC TROMETHAMINE 15 MG/ML IJ SOLN
15.0000 mg | Freq: Four times a day (QID) | INTRAMUSCULAR | Status: DC | PRN
  Administered 2022-06-24 – 2022-06-27 (×3): 15 mg via INTRAVENOUS
  Filled 2022-06-24 (×3): qty 1

## 2022-06-24 MED ORDER — AMOXICILLIN-POT CLAVULANATE 875-125 MG PO TABS
1.0000 | ORAL_TABLET | Freq: Two times a day (BID) | ORAL | Status: DC
  Administered 2022-06-25: 1 via ORAL
  Filled 2022-06-24: qty 1

## 2022-06-24 NOTE — ED Notes (Signed)
Pt asking for something for pain. Will msg MD.

## 2022-06-24 NOTE — ED Notes (Signed)
Pt ambulated to bathroom with standby assistance.

## 2022-06-24 NOTE — ED Notes (Addendum)
Assumed care of pt. Pt resting comfortably in bed at this time. Pt denies any current needs or questions. Call light with in reach.   

## 2022-06-24 NOTE — Progress Notes (Signed)
PHARMACY - PHYSICIAN COMMUNICATION CRITICAL VALUE ALERT - BLOOD CULTURE IDENTIFICATION (BCID)  Jordan Chang is an 81 y.o. male who presented to [facility] on 06/23/2022 with a chief complaint of jaw swelling and redness on the neck  Assessment:  1/4, anaerobic, S. Epidermidis (no resistance detected) and Streptococcal spp (source: potentially dental abscess if not a contaminant)  Name of physician (or Provider) Contacted: Marrion Coy  Current antibiotics: Unasyn  Changes to prescribed antibiotics recommended:  Results could be contamination although pt does have dental abscess. Pt to follow up with dentist as outpatient and planning to discharge on oral Augmentin. No changes in antibiotics recommended .   Results for orders placed or performed during the hospital encounter of 06/23/22  Blood Culture ID Panel (Reflexed) (Collected: 06/23/2022  9:07 PM)  Result Value Ref Range   Enterococcus faecalis NOT DETECTED NOT DETECTED   Enterococcus Faecium NOT DETECTED NOT DETECTED   Listeria monocytogenes NOT DETECTED NOT DETECTED   Staphylococcus species DETECTED (A) NOT DETECTED   Staphylococcus aureus (BCID) NOT DETECTED NOT DETECTED   Staphylococcus epidermidis DETECTED (A) NOT DETECTED   Staphylococcus lugdunensis NOT DETECTED NOT DETECTED   Streptococcus species DETECTED (A) NOT DETECTED   Streptococcus agalactiae NOT DETECTED NOT DETECTED   Streptococcus pneumoniae NOT DETECTED NOT DETECTED   Streptococcus pyogenes NOT DETECTED NOT DETECTED   A.calcoaceticus-baumannii NOT DETECTED NOT DETECTED   Bacteroides fragilis NOT DETECTED NOT DETECTED   Enterobacterales NOT DETECTED NOT DETECTED   Enterobacter cloacae complex NOT DETECTED NOT DETECTED   Escherichia coli NOT DETECTED NOT DETECTED   Klebsiella aerogenes NOT DETECTED NOT DETECTED   Klebsiella oxytoca NOT DETECTED NOT DETECTED   Klebsiella pneumoniae NOT DETECTED NOT DETECTED   Proteus species NOT DETECTED NOT DETECTED    Salmonella species NOT DETECTED NOT DETECTED   Serratia marcescens NOT DETECTED NOT DETECTED   Haemophilus influenzae NOT DETECTED NOT DETECTED   Neisseria meningitidis NOT DETECTED NOT DETECTED   Pseudomonas aeruginosa NOT DETECTED NOT DETECTED   Stenotrophomonas maltophilia NOT DETECTED NOT DETECTED   Candida albicans NOT DETECTED NOT DETECTED   Candida auris NOT DETECTED NOT DETECTED   Candida glabrata NOT DETECTED NOT DETECTED   Candida krusei NOT DETECTED NOT DETECTED   Candida parapsilosis NOT DETECTED NOT DETECTED   Candida tropicalis NOT DETECTED NOT DETECTED   Cryptococcus neoformans/gattii NOT DETECTED NOT DETECTED   Methicillin resistance mecA/C NOT DETECTED NOT DETECTED   Will M. Dareen Piano, PharmD PGY-1 Pharmacy Resident 06/24/2022 2:05 PM

## 2022-06-24 NOTE — ED Notes (Signed)
Pt up and down to bathroom - steady gait noted. Pt voices no needs.

## 2022-06-24 NOTE — Evaluation (Signed)
Occupational Therapy Evaluation Patient Details Name: Jordan Chang MRN: [id number] DOB: 08/28/1940 Today's Date: 06/24/2022   History of Present Illness Pt is an 81 year old male admitted with presenting to the ED with acute onset of generalized weakness and a fall in the bathroom where he could not get up again after tooth pulled on 12/26; PMH significant for essential hypertension, dyslipidemia and GERD   Clinical Impression   Chart reviewed, pt greeted in ED hallway bed agreeable to OT evaluation. Pt is very HOH, wife verifies home set up. PTA Pt has supervision/assist for bathing, IADLs from wife, is able to perform ADL with supervision-MOD I. Pt amb with SPC. Pt wife reports she feels he is close to baseline ADL status, slight deficits noted in balance, DME knowledge/use. OT will follow acutely to address functional deficits. No OT follow up anticipated following discharge.      Recommendations for follow up therapy are one component of a multi-disciplinary discharge planning process, led by the attending physician.  Recommendations may be updated based on patient status, additional functional criteria and insurance authorization.   Follow Up Recommendations  No OT follow up     Assistance Recommended at Discharge Intermittent Supervision/Assistance  Patient can return home with the following A little help with bathing/dressing/bathroom    Functional Status Assessment  Patient has had a recent decline in their functional status and demonstrates the ability to make significant improvements in function in a reasonable and predictable amount of time.  Equipment Recommendations  None recommended by OT    Recommendations for Other Services       Precautions / Restrictions Precautions Precautions: Fall Restrictions Weight Bearing Restrictions: No      Mobility Bed Mobility Overal bed mobility: Modified Independent                  Transfers Overall transfer  level: Modified independent Equipment used: Straight cane                      Balance Overall balance assessment: Needs assistance Sitting-balance support: Feet supported Sitting balance-Leahy Scale: Good     Standing balance support: During functional activity, Single extremity supported Standing balance-Leahy Scale: Fair                             ADL either performed or assessed with clinical judgement   ADL Overall ADL's : Needs assistance/impaired Eating/Feeding: Set up;Sitting   Grooming: Wash/dry hands;Standing;Supervision/safety Grooming Details (indicate cue type and reason): sink level         Upper Body Dressing : Supervision/safety   Lower Body Dressing: Minimal assistance Lower Body Dressing Details (indicate cue type and reason): anticipated Toilet Transfer: Supervision/safety;Min guard Toilet Transfer Details (indicate cue type and reason): with SPC, regular toilet Toileting- Clothing Manipulation and Hygiene: Supervision/safety;Sit to/from stand       Functional mobility during ADLs: Supervision/safety;Min guard;Cane (approx 75' in hallway, to bathroom and back)       Vision Patient Visual Report: No change from baseline       Perception     Praxis      Pertinent Vitals/Pain Pain Assessment Pain Assessment: No/denies pain     Hand Dominance Right   Extremity/Trunk Assessment Upper Extremity Assessment Upper Extremity Assessment: Overall WFL for tasks assessed (mild residual RUE deficits from previous strokes, functionally does not appear to affect pt with ADLs at this time)   Lower Extremity  Assessment Lower Extremity Assessment: Defer to PT evaluation       Communication Communication Communication: HOH   Cognition Arousal/Alertness: Awake/alert Behavior During Therapy: WFL for tasks assessed/performed Overall Cognitive Status: Within Functional Limits for tasks assessed                                  General Comments: pt follows one step directions, fair safety awareness; pt wife reports no change from cognitive baseline     General Comments  vitals monitored, appear stable throughout    Exercises Other Exercises Other Exercises: edu pt and wife re role of OT, role of rehab, discharge recommendations, home safety   Shoulder Instructions      Home Living Family/patient expects to be discharged to:: Private residence Living Arrangements: Spouse/significant other Available Help at Discharge: Family;Available 24 hours/day Type of Home: House Home Access: Stairs to enter [employer] of Steps: 3 Entrance Stairs-Rails: Right Home Layout: One level     Bathroom Shower/Tub: Occupational psychologist: Standard     Home Equipment: Conservation officer, nature (2 wheels);Cane - single point          Prior Functioning/Environment Prior Level of Function : Independent/Modified Independent             Mobility Comments: amb with SPC ADLs Comments: dressing, feeding, grooming with MOD I, wife assists with IADLs        OT Problem List: Decreased strength;Decreased activity tolerance;Decreased knowledge of use of DME or AE      OT Treatment/Interventions: Self-care/ADL training;DME and/or AE instruction;Therapeutic activities;Therapeutic exercise;Patient/family education    OT Goals(Current goals can be found in the care plan section) Acute Rehab OT Goals Patient Stated Goal: go home OT Goal Formulation: With patient Time For Goal Achievement: 07/08/22 Potential to Achieve Goals: Good  OT Frequency: Min 2X/week    Co-evaluation              AM-PAC OT "6 Clicks" Daily Activity     Outcome Measure Help from another person eating meals?: None Help from another person taking care of personal grooming?: None Help from another person toileting, which includes using toliet, bedpan, or urinal?: None Help from another person bathing (including washing,  rinsing, drying)?: A Little Help from another person to put on and taking off regular upper body clothing?: None   6 Click Score: 19   End of Session Equipment Utilized During Treatment: Other (comment) (SPC)  Activity Tolerance: Patient tolerated treatment well Patient left: in bed;with call bell/phone within reach  OT Visit Diagnosis: Unsteadiness on feet (R26.81);History of falling (Z91.81)                Time: [id number] OT Time Calculation (min): 13 min Charges:  OT General Charges $OT Visit: 1 Visit OT Evaluation $OT Eval Low Complexity: 1 Low  Shanon Payor, OTD OTR/L  06/24/22, 10:44 AM

## 2022-06-24 NOTE — Evaluation (Signed)
Physical Therapy Evaluation Patient Details Name: Jordan Chang MRN: [id number] DOB: 09/16/1940 Today's Date: 06/24/2022  History of Present Illness  Pt is an 81 year old male admitted with presenting to the ED with acute onset of generalized weakness and a fall in the bathroom where he could not get up again after tooth pulled on 12/26; PMH significant for essential hypertension, dyslipidemia and GERD  Clinical Impression  Pt seen PT evaluation with wife present for session. Pt is able to ambulate in hallway with [facility] & supervision without overt LOB. Pt reports he is feeling a little better compared to yesterday but not completely back to himself. Anticipate pt can d/c home with HHPT f/u to address strengthening & activity tolerance.     Recommendations for follow up therapy are one component of a multi-disciplinary discharge planning process, led by the attending physician.  Recommendations may be updated based on patient status, additional functional criteria and insurance authorization.  Follow Up Recommendations Home health PT      Assistance Recommended at Discharge PRN  Patient can return home with the following  Assistance with cooking/housework    Equipment Recommendations None recommended by PT  Recommendations for Other Services       Functional Status Assessment Patient has had a recent decline in their functional status and demonstrates the ability to make significant improvements in function in a reasonable and predictable amount of time.     Precautions / Restrictions Precautions Precautions: Fall Restrictions Weight Bearing Restrictions: No      Mobility  Bed Mobility Overal bed mobility: Modified Independent             General bed mobility comments: supine<>sit with HOB slightly elevated    Transfers Overall transfer level: Modified independent Equipment used: Straight cane               General transfer comment: STS from low surface     Ambulation/Gait Ambulation/Gait assistance: Supervision Gait Distance (Feet): 170 Feet Assistive device: Straight cane Gait Pattern/deviations: WFL(Within Functional Limits) Gait velocity: slightly decreased        Stairs            Wheelchair Mobility    Modified Rankin (Stroke Patients Only)       Balance Overall balance assessment: Needs assistance Sitting-balance support: Feet supported Sitting balance-Leahy Scale: Good     Standing balance support: During functional activity, Single extremity supported Standing balance-Leahy Scale: Good                               Pertinent Vitals/Pain Pain Assessment Pain Assessment: No/denies pain    Home Living Family/patient expects to be discharged to:: Private residence Living Arrangements: Spouse/significant other Available Help at Discharge: Family;Available 24 hours/day Type of Home: House Home Access: Stairs to enter Entrance Stairs-Rails: Right Entrance Stairs-Number of Steps: 3   Home Layout: One level Home Equipment: Agricultural consultant (2 wheels);Cane - single point      Prior Function Prior Level of Function : Independent/Modified Independent             Mobility Comments: amb with SPC ADLs Comments: dressing, feeding, grooming with MOD I, wife assists with IADLs     Hand Dominance   Dominant Hand: Right    Extremity/Trunk Assessment   Upper Extremity Assessment Upper Extremity Assessment: Overall WFL for tasks assessed    Lower Extremity Assessment Lower Extremity Assessment: Overall WFL for tasks assessed  Communication   Communication: HOH  Cognition Arousal/Alertness: Awake/alert Behavior During Therapy: WFL for tasks assessed/performed Overall Cognitive Status: Within Functional Limits for tasks assessed                                          General Comments General comments (skin integrity, edema, etc.): pt on room air, SpO2 >90% at  beginning & end of session    Exercises     Assessment/Plan    PT Assessment Patient needs continued PT services  PT Problem List Decreased mobility;Decreased activity tolerance       PT Treatment Interventions Therapeutic exercise;DME instruction;Gait training;Neuromuscular re-education;Stair training;Balance training;Functional mobility training;Therapeutic activities;Patient/family education    PT Goals (Current goals can be found in the Care Plan section)  Acute Rehab PT Goals Patient Stated Goal: face get better PT Goal Formulation: With patient/family Time For Goal Achievement: 07/08/22 Potential to Achieve Goals: Good    Frequency Min 2X/week     Co-evaluation               AM-PAC PT "6 Clicks" Mobility  Outcome Measure Help needed turning from your back to your side while in a flat bed without using bedrails?: None Help needed moving from lying on your back to sitting on the side of a flat bed without using bedrails?: None Help needed moving to and from a bed to a chair (including a wheelchair)?: None Help needed standing up from a chair using your arms (e.g., wheelchair or bedside chair)?: None Help needed to walk in hospital room?: A Little Help needed climbing 3-5 steps with a railing? : A Little 6 Click Score: 22    End of Session   Activity Tolerance: Patient tolerated treatment well Patient left: in bed;with family/visitor present Nurse Communication: Mobility status PT Visit Diagnosis: Muscle weakness (generalized) (M62.81)    Time: 3244-0102 PT Time Calculation (min) (ACUTE ONLY): 10 min   Charges:   PT Evaluation $PT Eval Moderate Complexity: 1 Mod          Aleda Grana, PT, DPT 06/24/22, 10:36 AM   Sandi Mariscal 06/24/2022, 10:35 AM

## 2022-06-24 NOTE — Progress Notes (Addendum)
  Progress Note   Patient: Jordan Chang:[id number] DOB: [birth year] DOA: 06/23/2022     1 DOS: the patient was seen and examined on 06/24/2022   Brief hospital course: Jordan Chang is a 81 y.o. Caucasian male with medical history significant for essential hypertension, dyslipidemia and GERD, who presented to the emergency room with acute onset of generalized weakness and a fall in the bathroom where he could not get up again.  He had a tooth pulled on 12/26 and since then has been having jaw swelling and redness on the neck.  He feels it is getting worse.  He was having low-grade fever of 100.3 with tachycardia  Upon arrival in the hospital, temperature 100.3, heart rate 107, lactic acid 2.4.  CT neck with contrast showed odontogenic abscess of 2.3 x 2.5 cm Patient was started on IV Unasyn.  Assessment and Plan: Facial cellulitis. Dental abscess. Patient still has significant swelling in the right face, will continue Unasyn for now.  Patient can follow-up with his dentist as outpatient.  Planning discharge home tomorrow with oral Augmentin.  Generalized weakness secondary to cellulitis Patient is seen by OT, currently able to walk.  May not need nursing home placement.  Type 2 diabetes. Continue current regimen.  Essential hypertension Continue home medicines.      Subjective:  Patient still has signal weakness, also complaining of right facial pain with swelling.  Physical Exam: Vitals:   06/23/22 1805 06/23/22 2241 06/24/22 0140 06/24/22 0545  BP:  (!) 164/72 (!) 160/70 (!) 159/64  Pulse:  100 (!) 102 100  Resp:  16 16 14   Temp:  100 F (37.8 C)  99 F (37.2 C)  TempSrc:  Oral    SpO2:  95% 95% 95%  Weight: 126.6 kg     Height: 6' (1.829 m)      General exam: Appears calm and comfortable  Respiratory system: Clear to auscultation. Respiratory effort normal. Cardiovascular system: S1 & S2 heard, RRR. No JVD, murmurs, rubs, gallops or clicks. No pedal  edema. Gastrointestinal system: Abdomen is nondistended, soft and nontender. No organomegaly or masses felt. Normal bowel sounds heard. Central nervous system: Alert and oriented. No focal neurological deficits. Extremities: Symmetric 5 x 5 power. Skin: No rashes, lesions or ulcers Psychiatry: Judgement and insight appear normal. Mood & affect appropriate.  Right facial swelling.   Data Reviewed:  Reviewed the CT scan results and the lab results.  Family Communication: wife updated  Disposition: Status is: Inpatient Remains inpatient appropriate because: Severity of disease, IV treatment.  Planned Discharge Destination: Home with Home Health    Time spent: 35 minutes  Author: 01-28-1985, MD 06/24/2022 10:15 AM  For on call review [email or web address]/26/[email or web address].

## 2022-06-24 NOTE — Hospital Course (Signed)
Jordan Chang is a 81 y.o. Caucasian male with medical history significant for essential hypertension, dyslipidemia and GERD, who presented to the emergency room with acute onset of generalized weakness and a fall in the bathroom where he could not get up again.  He had a tooth pulled on 12/26 and since then has been having jaw swelling and redness on the neck.  He feels it is getting worse.  He was having low-grade fever of 100.3 with tachycardia  Upon arrival in the hospital, temperature 100.3, heart rate 107, lactic acid 2.4.  CT neck with contrast showed odontogenic abscess of 2.3 x 2.5 cm Patient was started on IV Unasyn.

## 2022-06-25 DIAGNOSIS — L03211 Cellulitis of face: Secondary | ICD-10-CM | POA: Diagnosis not present

## 2022-06-25 DIAGNOSIS — D696 Thrombocytopenia, unspecified: Secondary | ICD-10-CM | POA: Insufficient documentation

## 2022-06-25 DIAGNOSIS — R531 Weakness: Secondary | ICD-10-CM | POA: Diagnosis not present

## 2022-06-25 DIAGNOSIS — K047 Periapical abscess without sinus: Secondary | ICD-10-CM | POA: Diagnosis not present

## 2022-06-25 LAB — CBC
HCT: 37.2 % — ABNORMAL LOW (ref 39.0–52.0)
Hemoglobin: 12.5 g/dL — ABNORMAL LOW (ref 13.0–17.0)
MCH: 31.3 pg (ref 26.0–34.0)
MCHC: 33.6 g/dL (ref 30.0–36.0)
MCV: 93.2 fL (ref 80.0–100.0)
Platelets: 115 10*3/uL — ABNORMAL LOW (ref 150–400)
RBC: 3.99 MIL/uL — ABNORMAL LOW (ref 4.22–5.81)
RDW: 13.5 % (ref 11.5–15.5)
WBC: 8 10*3/uL (ref 4.0–10.5)
nRBC: 0 % (ref 0.0–0.2)

## 2022-06-25 LAB — GLUCOSE, CAPILLARY
Glucose-Capillary: 112 mg/dL — ABNORMAL HIGH (ref 70–99)
Glucose-Capillary: 100 mg/dL — ABNORMAL HIGH (ref 70–99)
Glucose-Capillary: 108 mg/dL — ABNORMAL HIGH (ref 70–99)
Glucose-Capillary: 83 mg/dL (ref 70–99)

## 2022-06-25 LAB — BASIC METABOLIC PANEL
Anion gap: 7 (ref 5–15)
BUN: 26 mg/dL — ABNORMAL HIGH (ref 8–23)
CO2: 24 mmol/L (ref 22–32)
Calcium: 8.3 mg/dL — ABNORMAL LOW (ref 8.9–10.3)
Chloride: 109 mmol/L (ref 98–111)
Creatinine, Ser: 1.16 mg/dL (ref 0.61–1.24)
GFR, Estimated: >60 mL/min (ref >60)
Glucose, Bld: 115 mg/dL — ABNORMAL HIGH (ref 70–99)
Potassium: 3.6 mmol/L (ref 3.5–5.1)
Sodium: 140 mmol/L (ref 135–145)

## 2022-06-25 LAB — MAGNESIUM: Magnesium: 2 mg/dL (ref 1.7–2.4)

## 2022-06-25 NOTE — Plan of Care (Signed)
  Problem: Education: Goal: Ability to describe self-care measures that may prevent or decrease complications (Diabetes Survival Skills Education) will improve 06/25/2022 1156 by Rayetta Humphrey, RN Outcome: Progressing 06/25/2022 1156 by Rayetta Humphrey, RN Outcome: Progressing Goal: Individualized Educational Video(s) 06/25/2022 1156 by Rayetta Humphrey, RN Outcome: Progressing 06/25/2022 1156 by Rayetta Humphrey, RN Outcome: Progressing   Problem: Coping: Goal: Ability to adjust to condition or change in health will improve 06/25/2022 1156 by Rayetta Humphrey, RN Outcome: Progressing 06/25/2022 1156 by Rayetta Humphrey, RN Outcome: Progressing   Problem: Fluid Volume: Goal: Ability to maintain a balanced intake and output will improve 06/25/2022 1156 by Rayetta Humphrey, RN Outcome: Progressing 06/25/2022 1156 by Rayetta Humphrey, RN Outcome: Progressing   Problem: Health Behavior/Discharge Planning: Goal: Ability to identify and utilize available resources and services will improve 06/25/2022 1156 by Rayetta Humphrey, RN Outcome: Progressing 06/25/2022 1156 by Rayetta Humphrey, RN Outcome: Progressing Goal: Ability to manage health-related needs will improve 06/25/2022 1156 by Rayetta Humphrey, RN Outcome: Progressing 06/25/2022 1156 by Rayetta Humphrey, RN Outcome: Progressing   Problem: Metabolic: Goal: Ability to maintain appropriate glucose levels will improve 06/25/2022 1156 by Rayetta Humphrey, RN Outcome: Progressing 06/25/2022 1156 by Rayetta Humphrey, RN Outcome: Progressing   Problem: Nutritional: Goal: Maintenance of adequate nutrition will improve 06/25/2022 1156 by Rayetta Humphrey, RN Outcome: Progressing 06/25/2022 1156 by Rayetta Humphrey, RN Outcome: Progressing Goal: Progress toward achieving an optimal weight will improve 06/25/2022 1156 by Rayetta Humphrey, RN Outcome: Progressing 06/25/2022 1156 by Rayetta Humphrey,  RN Outcome: Progressing   Problem: Skin Integrity: Goal: Risk for impaired skin integrity will decrease 06/25/2022 1156 by Rayetta Humphrey, RN Outcome: Progressing 06/25/2022 1156 by Rayetta Humphrey, RN Outcome: Progressing   Problem: Tissue Perfusion: Goal: Adequacy of tissue perfusion will improve 06/25/2022 1156 by Rayetta Humphrey, RN Outcome: Progressing 06/25/2022 1156 by Rayetta Humphrey, RN Outcome: Progressing   Problem: Education: Goal: Knowledge of General Education information will improve Description: Including pain rating scale, medication(s)/side effects and non-pharmacologic comfort measures 06/25/2022 1156 by Rayetta Humphrey, RN Outcome: Progressing 06/25/2022 1156 by Rayetta Humphrey, RN Outcome: Progressing   Problem: Health Behavior/Discharge Planning: Goal: Ability to manage health-related needs will improve 06/25/2022 1156 by Rayetta Humphrey, RN Outcome: Progressing 06/25/2022 1156 by Rayetta Humphrey, RN Outcome: Progressing   Problem: Clinical Measurements: Goal: Ability to maintain clinical measurements within normal limits will improve Outcome: Progressing Goal: Will remain free from infection Outcome: Progressing Goal: Diagnostic test results will improve Outcome: Progressing Goal: Respiratory complications will improve Outcome: Progressing Goal: Cardiovascular complication will be avoided Outcome: Progressing   Problem: Activity: Goal: Risk for activity intolerance will decrease Outcome: Progressing   Problem: Nutrition: Goal: Adequate nutrition will be maintained Outcome: Progressing   Problem: Coping: Goal: Level of anxiety will decrease Outcome: Progressing   Problem: Elimination: Goal: Will not experience complications related to bowel motility Outcome: Progressing Goal: Will not experience complications related to urinary retention Outcome: Progressing   Problem: Pain Managment: Goal: General experience of  comfort will improve Outcome: Progressing   Problem: Safety: Goal: Ability to remain free from injury will improve Outcome: Progressing   Problem: Skin Integrity: Goal: Risk for impaired skin integrity will decrease Outcome: Progressing

## 2022-06-25 NOTE — TOC Initial Note (Signed)
Transition of Care [facility]) - Initial/Assessment Note    Patient Details  Name: Jordan Chang MRN: [id number] Date of Birth: February 03, 1941  [facility]) CM/SW Contact:    Rebekah [place], LCSW Phone Number: 06/25/2022, 4:10 PM  Clinical Narrative:                 CSW met with patient with spouse at bedside. CSW introduced role and informed family of PT's recommendation for [facility] PT. Patient was agreeable to receive CMS scores for local agencies and will like to think about it overnight. LCSW encouraged patient and spouse to inform TOC, if they would like to participate in services. Both were informed that they can request HH with their PCP, as well. No DME recommendations at this time. No further concerns  Expected Discharge Plan: [place] Barriers to Discharge: Continued Medical Work up   Patient Goals and CMS Choice   CMS Medicare.gov Compare Post Acute Care list provided to:: Patient Choice offered to / list presented to : Patient, Spouse      Expected Discharge Plan and Services       Living arrangements for the past 2 months: Single Family Home                                      Prior Living Arrangements/Services Living arrangements for the past 2 months: [place] Lives with:: Spouse Patient language and need for interpreter reviewed:: Yes Do you feel safe going back to the place where you live?: Yes      Need for Family Participation in Patient Care: Yes (Comment) Care giver support system in place?: Yes (comment) Current home services: DME Kasandra Knudsen and Environmental consultant) Criminal Activity/Legal Involvement Pertinent to Current Situation/Hospitalization: No - Comment as needed  Activities of Daily Living Home Assistive Devices/Equipment: CBG Meter, Walker (specify type), Eyeglasses, Cane (specify quad or straight) ADL Screening (condition at time of admission) Patient's cognitive ability adequate to safely complete daily activities?:  Yes Is the patient deaf or have difficulty hearing?: No Does the patient have difficulty seeing, even when wearing glasses/contacts?: No Does the patient have difficulty concentrating, remembering, or making decisions?: No Patient able to express need for assistance with ADLs?: Yes Does the patient have difficulty dressing or bathing?: No Independently performs ADLs?: No Communication: Independent Dressing (OT): Independent Grooming: Independent Feeding: Independent Bathing: Independent Toileting: Independent In/Out Bed: Independent Walks in Home: Independent with device (comment) Does the patient have difficulty walking or climbing stairs?: Yes Weakness of Legs: Right Weakness of Arms/Hands: Right  Permission Sought/Granted                  Emotional Assessment         Alcohol / Substance Use: Not Applicable Psych Involvement: No (comment)  Admission diagnosis:  Tooth abscess [K04.7] Generalized weakness [R53.1] Patient Active Problem List   Diagnosis Date Noted   Thrombocytopenia ([place]) 06/25/2022   Dental abscess 06/24/2022   Generalized weakness 06/23/2022   Facial cellulitis 06/23/2022   Dyslipidemia 06/23/2022   Type 2 diabetes mellitus without complications ([place]) 70/96/2836   Hyperlipidemia 10/28/2019   Left pontine cerebrovascular accident ([place]) 10/28/2019   Acute CVA (cerebrovascular accident) ([place]) 10/26/2019   Type 2 diabetes mellitus with other specified complication ([place]) 62/94/7654   Essential hypertension 10/26/2019   Chronic back pain 01/22/2016   Lumbar herniated disc 01/20/2016   PCP:  Tracie Harrier, MD Pharmacy:   [place], [place] [address] Phone: [phone] Fax: [phone]  [id number] [place] [place] [address] [id number]: [phone]: [phone]    Social Determinants of Health (SDOH) Social  History: [place] No Food Insecurity (06/24/2022)  Housing: Low Risk  (06/24/2022)  Transportation Needs: No Transportation Needs (06/24/2022)  Utilities: Not At Risk (06/24/2022)  Depression (PHQ2-9): Low Risk  (01/22/2020)  Tobacco Use: Low Risk  (06/23/2022)   SDOH Interventions:     Readmission Risk Interventions     No data to display

## 2022-06-25 NOTE — Progress Notes (Signed)
  Progress Note   Patient: Jordan Chang UJW:[id number] DOB: 20-Nov-1940 DOA: 06/23/2022     2 DOS: the patient was seen and examined on 06/25/2022   Brief hospital course: Jordan Chang is a 81 y.o. Caucasian male with medical history significant for essential hypertension, dyslipidemia and GERD, who presented to the emergency room with acute onset of generalized weakness and a fall in the bathroom where he could not get up again.  He had a tooth pulled on 12/26 and since then has been having jaw swelling and redness on the neck.  He feels it is getting worse.  He was having low-grade fever of 100.3 with tachycardia  Upon arrival in the hospital, temperature 100.3, heart rate 107, lactic acid 2.4.  CT neck with contrast showed odontogenic abscess of 2.3 x 2.5 cm Patient was started on IV Unasyn.  Assessment and Plan: Facial cellulitis. Dental abscess. Patient still has significant facial swelling, does not seem to be much improved since yesterday.  Antibiotic was switched to Augmentin yesterday, I will switch back to Unasyn.  Also added doxycycline on top.  Hopefully can be discharged home tomorrow. Blood culture positive staph epidermis,just had 1 set drawn,  most likely this is contaminant.  I will reculture today.   Generalized weakness secondary to cellulitis Patient is seen by OT, currently able to walk.  May not need nursing home placement.   Type 2 diabetes. Continue current regimen.   Essential hypertension Continue home medicines.       Subjective:  Still has significant facial swelling, no fever or chills.  Physical Exam: Vitals:   06/24/22 2024 06/24/22 2052 06/25/22 0344 06/25/22 0830  BP: (!) 145/61 (!) 155/60 (!) 160/62 (!) 146/63  Pulse: 80 79 76 67  Resp: 20 20 20 18   Temp: 98.8 F (37.1 C) 99.4 F (37.4 C) 98 F (36.7 C) 98.3 F (36.8 C)  TempSrc:  Oral Oral Oral  SpO2: 97% 93% 95% 96%  Weight:  124 kg    Height:  6' (1.829 m)     General exam:  Appears calm and comfortable  Respiratory system: Clear to auscultation. Respiratory effort normal. Cardiovascular system: S1 & S2 heard, RRR. No JVD, murmurs, rubs, gallops or clicks. No pedal edema. Gastrointestinal system: Abdomen is nondistended, soft and nontender. No organomegaly or masses felt. Normal bowel sounds heard. Central nervous system: Alert and oriented. No focal neurological deficits. Extremities: Symmetric 5 x 5 power. Skin: No rashes, lesions or ulcers Psychiatry: Judgement and insight appear normal. Mood & affect appropriate.  Still has significant right-sided swelling, mainly in the neck. Data Reviewed:  Lab results reviewed.  Family Communication: Wife updated at bedside.  Disposition: Status is: Inpatient Remains inpatient appropriate because: Severity of disease, IV treatment  Planned Discharge Destination: Home with Home Health    Time spent: 35 minutes  Author: 01-28-1985, MD 06/25/2022 12:16 PM  For on call review [email or web address]/27/2022.

## 2022-06-26 DIAGNOSIS — R531 Weakness: Secondary | ICD-10-CM | POA: Diagnosis not present

## 2022-06-26 DIAGNOSIS — L03211 Cellulitis of face: Secondary | ICD-10-CM | POA: Diagnosis not present

## 2022-06-26 LAB — GLUCOSE, CAPILLARY
Glucose-Capillary: 105 mg/dL — ABNORMAL HIGH (ref 70–99)
Glucose-Capillary: 109 mg/dL — ABNORMAL HIGH (ref 70–99)
Glucose-Capillary: 104 mg/dL — ABNORMAL HIGH (ref 70–99)
Glucose-Capillary: 97 mg/dL (ref 70–99)

## 2022-06-26 LAB — CULTURE, BLOOD (SINGLE): Special Requests: ADEQUATE

## 2022-06-26 MED ORDER — SODIUM CHLORIDE 0.9 % IV SOLN: INTRAVENOUS | Status: DC | PRN

## 2022-06-26 MED ORDER — SODIUM CHLORIDE 0.9 % IV SOLN
3.0000 g | Freq: Four times a day (QID) | INTRAVENOUS | Status: AC
  Administered 2022-06-26 – 2022-06-27 (×4): 3 g via INTRAVENOUS
  Filled 2022-06-26 (×3): qty 8
  Filled 2022-06-26 (×2): qty 3

## 2022-06-26 MED ORDER — SODIUM CHLORIDE 0.9 % IV SOLN
3.0000 g | Freq: Four times a day (QID) | INTRAVENOUS | Status: DC
  Filled 2022-06-26: qty 8

## 2022-06-26 MED ORDER — AMOXICILLIN-POT CLAVULANATE 875-125 MG PO TABS: 1.0000 | ORAL_TABLET | Freq: Two times a day (BID) | ORAL | Status: DC

## 2022-06-26 NOTE — Progress Notes (Signed)
  Progress Note   Patient: Jordan Chang:[id number] DOB: 1941/01/22 DOA: 06/23/2022     3 DOS: the patient was seen and examined on 06/26/2022   Brief hospital course: Jordan Chang is a 82 y.o. Caucasian male with medical history significant for essential hypertension, dyslipidemia and GERD, who presented to the emergency room with acute onset of generalized weakness and a fall in the bathroom where he could not get up again.  He had a tooth pulled on 12/26 and since then has been having jaw swelling and redness on the neck.  He feels it is getting worse.  He was having low-grade fever of 100.3 with tachycardia  Upon arrival in the hospital, temperature 100.3, heart rate 107, lactic acid 2.4.  CT neck with contrast showed odontogenic abscess of 2.3 x 2.5 cm Patient was started on IV Unasyn.  Assessment and Plan: Facial cellulitis. Dental abscess. Swelling is better today, continue Unasyn for another day.  Likely discharge home with Augmentin tomorrow.   Generalized weakness secondary to cellulitis Patient is seen by OT, currently able to walk.  May not need nursing home placement.   Type 2 diabetes. Continue current regimen.   Essential hypertension Continue home medicines.      Subjective:  Right facial swelling is better, pain is better.  No fever or chills  Physical Exam: Vitals:   06/25/22 0830 06/25/22 1649 06/25/22 2033 06/26/22 0835  BP: (!) 146/63  133/63 128/68  Pulse: 67 68 65 62  Resp: 18  20 18   Temp: 98.3 F (36.8 C)  99.9 F (37.7 C) 98.5 F (36.9 C)  TempSrc: Oral  Oral Oral  SpO2: 96%  95% 93%  Weight:      Height:       General exam: Appears calm and comfortable  Respiratory system: Clear to auscultation. Respiratory effort normal. Cardiovascular system: S1 & S2 heard, RRR. No JVD, murmurs, rubs, gallops or clicks. No pedal edema. Gastrointestinal system: Abdomen is nondistended, soft and nontender. No organomegaly or masses felt. Normal bowel  sounds heard. Central nervous system: Alert and oriented. No focal neurological deficits. Extremities: Symmetric 5 x 5 power. Skin: No rashes, lesions or ulcers Psychiatry: Judgement and insight appear normal. Mood & affect appropriate.   Data Reviewed:  There are no new results to review at this time.  Family Communication: Wife updated at bedside.  Disposition: Status is: Inpatient Remains inpatient appropriate because: Severity of disease, IV treatment.  Planned Discharge Destination: Home with Home Health    Time spent: 35 minutes  Author: Sharen Hones, MD 06/26/2022 1:08 PM  For on call review [email or web address].

## 2022-06-26 NOTE — Plan of Care (Signed)
  Problem: Coping: Goal: Ability to adjust to condition or change in health will improve Outcome: Progressing   

## 2022-06-26 NOTE — TOC Progression Note (Addendum)
Transition of Care [facility]) - Progression Note    Patient Details  Name: Jordan Chang MRN: [id number] Date of Birth: 1941-02-15  Transition of Care [facility]) CM/SW [place], LCSW Phone Number: 06/26/2022, 11:35 AM  Clinical Narrative:  Went by room to see if patient wanted to pursue home health. Patient and family member both asleep. Will try again later.   1:02 pm: Wife still unsure about home health. She wants to call the [facility]) and make sure they will cover his hospitalization first. Patient last worked with [place] in 2021.  Expected Discharge Plan: [place] Barriers to Discharge: Continued Medical Work up  Expected Discharge Plan and Services       Living arrangements for the past 2 months: Single Family Home                                       Social Determinants of Health (SDOH) Interventions SDOH Screenings   Food Insecurity: No Food Insecurity (06/24/2022)  Housing: Low Risk  (06/24/2022)  Transportation Needs: No Transportation Needs (06/24/2022)  Utilities: Not At Risk (06/24/2022)  Depression (PHQ2-9): Low Risk  (01/22/2020)  Tobacco Use: Low Risk  (06/23/2022)    Readmission Risk Interventions     No data to display

## 2022-06-26 NOTE — Plan of Care (Signed)
  Problem: Activity: Goal: Risk for activity intolerance will decrease Outcome: Progressing   Problem: Coping: Goal: Level of anxiety will decrease Outcome: Progressing   Problem: Elimination: Goal: Will not experience complications related to urinary retention Outcome: Progressing   Problem: Pain Managment: Goal: General experience of comfort will improve Outcome: Progressing   Problem: Safety: Goal: Ability to remain free from injury will improve Outcome: Progressing   

## 2022-06-27 DIAGNOSIS — I10 Essential (primary) hypertension: Secondary | ICD-10-CM | POA: Diagnosis not present

## 2022-06-27 DIAGNOSIS — K047 Periapical abscess without sinus: Secondary | ICD-10-CM | POA: Diagnosis not present

## 2022-06-27 DIAGNOSIS — L03211 Cellulitis of face: Secondary | ICD-10-CM | POA: Diagnosis not present

## 2022-06-27 LAB — GLUCOSE, CAPILLARY: Glucose-Capillary: 99 mg/dL (ref 70–99)

## 2022-06-27 LAB — HEMOGLOBIN A1C
Hgb A1c MFr Bld: 6 % — ABNORMAL HIGH (ref 4.8–5.6)
Mean Plasma Glucose: 126 mg/dL

## 2022-06-27 MED ORDER — AMOXICILLIN-POT CLAVULANATE 875-125 MG PO TABS: 1.0000 | ORAL_TABLET | Freq: Two times a day (BID) | ORAL | 0 refills | Status: AC

## 2022-06-27 NOTE — Care Management Important Message (Signed)
Important Message  Patient Details  Name: CAMDON SAETERN MRN: [id number] Date of Birth: 01/18/1941   Medicare Important Message Given:  Yes  Reviewed Medicare IM with Katherina Mires, wife, via room phone [phone]).  Copy of Medicare IM placed in mail to home address on file.    Dannette Barbara 06/27/2022, 10:02 AM

## 2022-06-27 NOTE — Discharge Summary (Signed)
Physician Discharge Summary   Patient: Jordan Chang MRN: [id number] DOB: 15-May-1941  Admit date:     06/23/2022  Discharge date: 06/27/22  Discharge Physician: Marrion Coy   PCP: Barbette Reichmann, MD   Recommendations at discharge:   Follow-up with PCP in 1 week. Refer to oral surgery to be seen ASAP.   Discharge Diagnoses: Principal Problem:   Generalized weakness Active Problems:   Facial cellulitis   Essential hypertension   Dyslipidemia   Type 2 diabetes mellitus without complications (HCC)   Dental abscess   Thrombocytopenia (HCC)  Resolved Problems:   * No resolved hospital problems. *  Hospital Course: Jordan Chang is a 82 y.o. Caucasian male with medical history significant for essential hypertension, dyslipidemia and GERD, who presented to the emergency room with acute onset of generalized weakness and a fall in the bathroom where he could not get up again.  He had a tooth pulled on 12/26 and since then has been having jaw swelling and redness on the neck.  He feels it is getting worse.  He was having low-grade fever of 100.3 with tachycardia  Upon arrival in the hospital, temperature 100.3, heart rate 107, lactic acid 2.4.  CT neck with contrast showed odontogenic abscess of 2.3 x 2.5 cm Patient was started on IV Unasyn. Patient was treated with IV Unasyn, condition much improved, facial swelling much better.  Will continue 7 days of Augmentin.  Patient was referred to oral surgery as outpatient. Assessment and Plan: Facial cellulitis. Dental abscess. Condition much improved, continue oral antibiotics for 7 days, follow-up with PCP and oral surgery.   Generalized weakness secondary to cellulitis Patient is seen by OT, currently able to walk.  No need for nursing home.   Type 2 diabetes. Continue current regimen.   Essential hypertension Continue home medicines.       Consultants: None Procedures performed: None  Disposition: Home Diet  recommendation:  Discharge Diet Orders (From admission, onward)     Start     Ordered   06/27/22 0000  Diet - low sodium heart healthy        06/27/22 0954           Cardiac diet DISCHARGE MEDICATION: Allergies as of 06/27/2022       Reactions   Atorvastatin Shortness Of Breath, Other (See Comments)   Dyspnea    Other Palpitations, Other (See Comments)   STEROIDS caused tachycardia (exact one not recalled)        Medication List     TAKE these medications    acetaminophen 325 MG tablet Commonly known as: TYLENOL Take 2 tablets (650 mg total) by mouth every 4 (four) hours as needed for mild pain (or temp > 37.5 C (99.5 F)).   amoxicillin-clavulanate 875-125 MG tablet Commonly known as: AUGMENTIN Take 1 tablet by mouth every 12 (twelve) hours for 7 days.   atorvastatin 40 MG tablet Commonly known as: LIPITOR Take 1 tablet (40 mg total) by mouth every other day.   carvedilol 3.125 MG tablet Commonly known as: COREG Take 3.125 mg by mouth 2 (two) times daily with a meal.   clopidogrel 75 MG tablet Commonly known as: PLAVIX Take 1 tablet (75 mg total) by mouth daily.   losartan 50 MG tablet Commonly known as: COZAAR Take 50 mg by mouth daily.   metFORMIN 500 MG 24 hr tablet Commonly known as: GLUCOPHAGE-XR Take 500 mg by mouth daily.   tiZANidine 2 MG tablet Commonly known as:  ZANAFLEX Take 2 mg by mouth 3 (three) times daily.        Follow-up Information     Barbette Reichmann, MD Follow up in 1 week(s).   Specialty: Internal Medicine Contact information: [address] [place] [address] [phone]         Bunnie Domino II, DDS Follow up in 1 week(s).   Specialty: Oral Surgery Why: Patient need to be seen by oral surgery due to peridental abscess after tooth extraction.  Patient note can serve as a referral. Contact information: [address] [place] [address] [phone]                 Discharge Exam: Ceasar Mons Weights   06/23/22 1805 06/24/22 2052  Weight: 126.6 kg 124 kg   General exam: Appears calm and comfortable  Respiratory system: Clear to auscultation. Respiratory effort normal. Cardiovascular system: S1 & S2 heard, RRR. No JVD, murmurs, rubs, gallops or clicks. No pedal edema. Gastrointestinal system: Abdomen is nondistended, soft and nontender. No organomegaly or masses felt. Normal bowel sounds heard. Central nervous system: Alert and oriented. No focal neurological deficits. Extremities: Symmetric 5 x 5 power. Skin: No rashes, lesions or ulcers Psychiatry: Judgement and insight appear normal. Mood & affect appropriate.  Right facial swelling much improved.  Condition at discharge: good  The results of significant diagnostics from this hospitalization (including imaging, microbiology, ancillary and laboratory) are listed below for reference.   Imaging Studies: CT Soft Tissue Neck W Contrast  Result Date: 06/23/2022 CLINICAL DATA:  Fever and neck pain.  Recent tooth extraction. EXAM: CT NECK WITH CONTRAST TECHNIQUE: Multidetector CT imaging of the neck was performed using the standard protocol following the bolus administration of intravenous contrast. RADIATION DOSE REDUCTION: This exam was performed according to the departmental dose-optimization program which includes automated exposure control, adjustment of the mA and/or kV according to patient size and/or use of iterative reconstruction technique. CONTRAST:  45mL OMNIPAQUE IOHEXOL 300 MG/ML  SOLN COMPARISON:  None Available. FINDINGS: There is a posterior right mandibular molar extraction site with a large amount of adjacent edema that extends into the retromolar trigone. Fluid collection with a small amount of internal gas measures 2.3 x 2.5 cm (3:60). There is subcutaneous and deep soft tissue edema extending inferiorly into the submandibular space. Pharynx and larynx: Normal.  No retropharyngeal abscess or  effusion. Salivary glands: Mildly enlarged right submandibular gland. Parotid glands are normal. No sialolithiasis. Thyroid: Normal. Lymph nodes: Clustered subcentimeter submandibular lymph nodes bilaterally. Vascular: Calcific aortic atherosclerosis. Bilateral carotid bifurcation atherosclerosis, left worse than right. Limited intracranial: Normal Visualized orbits: Normal Mastoids and visualized paranasal sinuses: Clear Skeleton: Grade 1 anterolisthesis at C4-5. Multilevel degenerative disc disease and facet arthrosis. Upper chest: Negative Other: None IMPRESSION: 1. Posterior right mandibular molar extraction site with odontogenic abscess in the retromolar trigone measuring 2.3 x 2.5 cm. Aortic Atherosclerosis (ICD10-I70.0). Electronically Signed   By: Deatra Robinson M.D.   On: 06/23/2022 19:23    Microbiology: Results for orders placed or performed during the hospital encounter of 06/23/22  Resp panel by RT-PCR (RSV, Flu A&B, Covid) Anterior Nasal Swab     Status: None   Collection Time: 06/23/22  6:12 PM   Specimen: Anterior Nasal Swab  Result Value Ref Range Status   SARS Coronavirus 2 by RT PCR NEGATIVE NEGATIVE Final    Comment: (NOTE) SARS-CoV-2 target nucleic acids are NOT DETECTED.  The SARS-CoV-2 RNA is generally detectable in upper respiratory specimens  during the acute phase of infection. The lowest concentration of SARS-CoV-2 viral copies this assay can detect is 138 copies/mL. A negative result does not preclude SARS-Cov-2 infection and should not be used as the sole basis for treatment or other patient management decisions. A negative result may occur with  improper specimen collection/handling, submission of specimen other than nasopharyngeal swab, presence of viral mutation(s) within the areas targeted by this assay, and inadequate number of viral copies(<138 copies/mL). A negative result must be combined with clinical observations, patient history, and  epidemiological information. The expected result is Negative.  Fact Sheet for Patients:  BloggerCourse.com  Fact Sheet for Healthcare Providers:  SeriousBroker.it  This test is no t yet approved or cleared by the [place] FDA and  has been authorized for detection and/or diagnosis of SARS-CoV-2 by FDA under an Emergency Use Authorization (EUA). This EUA will remain  in effect (meaning this test can be used) for the duration of the COVID-19 declaration under Section 564(b)(1) of the Act, 21 U.S.C.section 360bbb-3(b)(1), unless the authorization is terminated  or revoked sooner.       Influenza A by PCR NEGATIVE NEGATIVE Final   Influenza B by PCR NEGATIVE NEGATIVE Final    Comment: (NOTE) The Xpert Xpress SARS-CoV-2/FLU/RSV plus assay is intended as an aid in the diagnosis of influenza from Nasopharyngeal swab specimens and should not be used as a sole basis for treatment. Nasal washings and aspirates are unacceptable for Xpert Xpress SARS-CoV-2/FLU/RSV testing.  Fact Sheet for Patients: BloggerCourse.com  Fact Sheet for Healthcare Providers: SeriousBroker.it  This test is not yet approved or cleared by the [place] FDA and has been authorized for detection and/or diagnosis of SARS-CoV-2 by FDA under an Emergency Use Authorization (EUA). This EUA will remain in effect (meaning this test can be used) for the duration of the COVID-19 declaration under Section 564(b)(1) of the Act, 21 U.S.C. section 360bbb-3(b)(1), unless the authorization is terminated or revoked.     Resp Syncytial Virus by PCR NEGATIVE NEGATIVE Final    Comment: (NOTE) Fact Sheet for Patients: BloggerCourse.com  Fact Sheet for Healthcare Providers: SeriousBroker.it  This test is not yet approved or cleared by the [place] FDA and has been  authorized for detection and/or diagnosis of SARS-CoV-2 by FDA under an Emergency Use Authorization (EUA). This EUA will remain in effect (meaning this test can be used) for the duration of the COVID-19 declaration under Section 564(b)(1) of the Act, 21 U.S.C. section 360bbb-3(b)(1), unless the authorization is terminated or revoked.  Performed at [facility], [address] [place], [place] [address]   Blood culture (single)     Status: Abnormal   Collection Time: 06/23/22  9:07 PM   Specimen: BLOOD  Result Value Ref Range Status   Specimen Description   Final    BLOOD BLOOD LEFT FOREARM Performed at [facility], [address], [place], [place] [address]    Special Requests   Final    BOTTLES DRAWN AEROBIC AND ANAEROBIC Blood Culture adequate volume Performed at [facility], [address] [place], [place] [address]    Culture  Setup Time   Final    IN BOTH AEROBIC AND ANAEROBIC BOTTLES GRAM POSITIVE COCCI Organism ID to follow CRITICAL RESULT CALLED TO, READ BACK BY AND VERIFIED WITH: WILL ANDERSON 06/24/22 @ 1344 BY SB Performed at [facility], [address] [place], [place] [address]    Culture (A)  Final    STAPHYLOCOCCUS  EPIDERMIDIS STAPHYLOCOCCUS CAPITIS STREPTOCOCCUS MITIS/ORALIS THE SIGNIFICANCE OF ISOLATING THIS ORGANISM FROM A SINGLE SET OF BLOOD CULTURES WHEN MULTIPLE SETS ARE DRAWN IS UNCERTAIN. PLEASE NOTIFY THE MICROBIOLOGY DEPARTMENT WITHIN ONE WEEK IF SPECIATION AND SENSITIVITIES ARE REQUIRED. Performed at [facility], [place] [address], [place], [place] [address]    Report Status 06/26/2022 FINAL  Final  Blood Culture ID Panel (Reflexed)     Status: Abnormal   Collection Time: 06/23/22  9:07 PM  Result Value Ref Range Status   Enterococcus faecalis NOT DETECTED NOT DETECTED Final   Enterococcus Faecium NOT DETECTED NOT DETECTED Final   Listeria monocytogenes NOT DETECTED NOT DETECTED Final    Staphylococcus species DETECTED (A) NOT DETECTED Final    Comment: CRITICAL RESULT CALLED TO, READ BACK BY AND VERIFIED WITH: WILL ANDERSON 06/24/22 @ 1344 BY SB    Staphylococcus aureus (BCID) NOT DETECTED NOT DETECTED Final   Staphylococcus epidermidis DETECTED (A) NOT DETECTED Final    Comment: CRITICAL RESULT CALLED TO, READ BACK BY AND VERIFIED WITH: WILL ANDERSON 06/24/22 @ 1344 BY SB    Staphylococcus lugdunensis NOT DETECTED NOT DETECTED Final   Streptococcus species DETECTED (A) NOT DETECTED Final    Comment: Not Enterococcus species, Streptococcus agalactiae, Streptococcus pyogenes, or Streptococcus pneumoniae. CRITICAL RESULT CALLED TO, READ BACK BY AND VERIFIED WITH: WILL ANDERSON 06/24/22 @ 1344 BY SB    Streptococcus agalactiae NOT DETECTED NOT DETECTED Final   Streptococcus pneumoniae NOT DETECTED NOT DETECTED Final   Streptococcus pyogenes NOT DETECTED NOT DETECTED Final   A.calcoaceticus-baumannii NOT DETECTED NOT DETECTED Final   Bacteroides fragilis NOT DETECTED NOT DETECTED Final   Enterobacterales NOT DETECTED NOT DETECTED Final   Enterobacter cloacae complex NOT DETECTED NOT DETECTED Final   Escherichia coli NOT DETECTED NOT DETECTED Final   Klebsiella aerogenes NOT DETECTED NOT DETECTED Final   Klebsiella oxytoca NOT DETECTED NOT DETECTED Final   Klebsiella pneumoniae NOT DETECTED NOT DETECTED Final   Proteus species NOT DETECTED NOT DETECTED Final   Salmonella species NOT DETECTED NOT DETECTED Final   Serratia marcescens NOT DETECTED NOT DETECTED Final   Haemophilus influenzae NOT DETECTED NOT DETECTED Final   Neisseria meningitidis NOT DETECTED NOT DETECTED Final   Pseudomonas aeruginosa NOT DETECTED NOT DETECTED Final   Stenotrophomonas maltophilia NOT DETECTED NOT DETECTED Final   Candida albicans NOT DETECTED NOT DETECTED Final   Candida auris NOT DETECTED NOT DETECTED Final   Candida glabrata NOT DETECTED NOT DETECTED Final   Candida krusei NOT  DETECTED NOT DETECTED Final   Candida parapsilosis NOT DETECTED NOT DETECTED Final   Candida tropicalis NOT DETECTED NOT DETECTED Final   Cryptococcus neoformans/gattii NOT DETECTED NOT DETECTED Final   Methicillin resistance mecA/C NOT DETECTED NOT DETECTED Final    Comment: Performed at [facility], [address] [place], [place] [address]  Culture, blood (Routine X 2) w Reflex to ID Panel     Status: None (Preliminary result)   Collection Time: 06/25/22  8:15 AM   Specimen: BLOOD  Result Value Ref Range Status   Specimen Description BLOOD BLOOD RIGHT HAND  Final   Special Requests   Final    BOTTLES DRAWN AEROBIC AND ANAEROBIC Blood Culture adequate volume   Culture   Final    NO GROWTH 2 DAYS Performed at [facility], [address] [place], [place] [address]    Report Status PENDING  Incomplete  Culture, blood (Routine X 2) w Reflex to ID Panel  Status: None (Preliminary result)   Collection Time: 06/25/22  8:15 AM   Specimen: BLOOD  Result Value Ref Range Status   Specimen Description BLOOD RIGHT ANTECUBITAL  Final   Special Requests   Final    BOTTLES DRAWN AEROBIC AND ANAEROBIC Blood Culture adequate volume   Culture   Final    NO GROWTH 2 DAYS Performed at [facility], [address] [place], [place] [address]    Report Status PENDING  Incomplete    Labs: CBC: Recent Labs  Lab 06/23/22 1804 06/24/22 0515 06/25/22 0512  WBC 9.5 8.6 8.0  NEUTROABS 7.3  --   --   HGB 14.8 12.8* 12.5*  HCT 43.8 38.5* 37.2*  MCV 93.8 93.9 93.2  PLT 126* 115* 680*   Basic Metabolic Panel: Recent Labs  Lab 06/23/22 1804 06/24/22 0515 06/25/22 0512  NA 139 140 140  K 3.8 3.5 3.6  CL 107 110 109  CO2 22 22 24   GLUCOSE 121* 116* 115*  BUN 26* 23 26*  CREATININE 1.22 1.03 1.16  CALCIUM 9.2 8.3* 8.3*  MG  --   --  2.0   Liver Function Tests: Recent Labs  Lab 06/23/22 1804  AST 48*  ALT 40  ALKPHOS 71  BILITOT 1.7*  PROT 7.3   ALBUMIN 3.9   CBG: Recent Labs  Lab 06/26/22 0833 06/26/22 1213 06/26/22 1628 06/26/22 2115 06/27/22 0805  GLUCAP 105* 109* 104* 97 99    Discharge time spent: greater than 30 minutes.  Signed: Sharen Hones, MD [facility] 06/27/2022

## 2022-06-27 NOTE — TOC Transition Note (Signed)
Transition of Care [facility]) - CM/SW Discharge Note   Patient Details  Name: Jordan Chang MRN: [id number] Date of Birth: 23-Aug-1940  Transition of Care [facility]) CM/SW Contact:  Beverly Sessions, RN Phone Number: 06/27/2022, 10:03 AM   Clinical Narrative:     Patient to discharge today Wife at bedside to transport [facility] followed up about home health recommendations.  Patient and wife decline at this time.  They are aware to follow up with PCP office should they change their mind    Barriers to Discharge: Continued Medical Work up   Patient Goals and CMS Choice CMS Medicare.gov Compare Post Acute Care list provided to:: Patient Choice offered to / list presented to : Patient, Spouse  Discharge Placement                         Discharge Plan and Services Additional resources added to the After Visit Summary for                                       Social Determinants of Health (SDOH) Interventions SDOH Screenings   Food Insecurity: No Food Insecurity (06/24/2022)  Housing: Low Risk  (06/24/2022)  Transportation Needs: No Transportation Needs (06/24/2022)  Utilities: Not At Risk (06/24/2022)  Depression (PHQ2-9): Low Risk  (01/22/2020)  Tobacco Use: Low Risk  (06/23/2022)     Readmission Risk Interventions     No data to display

## 2022-06-30 LAB — CULTURE, BLOOD (ROUTINE X 2)
Culture: NO GROWTH
Culture: NO GROWTH
Special Requests: ADEQUATE
Special Requests: ADEQUATE

## 2022-07-03 DIAGNOSIS — Z09 Encounter for follow-up examination after completed treatment for conditions other than malignant neoplasm: Secondary | ICD-10-CM | POA: Diagnosis not present

## 2022-07-03 DIAGNOSIS — L03211 Cellulitis of face: Secondary | ICD-10-CM | POA: Diagnosis not present

## 2022-07-03 DIAGNOSIS — D696 Thrombocytopenia, unspecified: Secondary | ICD-10-CM | POA: Diagnosis not present

## 2022-07-03 DIAGNOSIS — Z8673 Personal history of transient ischemic attack (TIA), and cerebral infarction without residual deficits: Secondary | ICD-10-CM | POA: Diagnosis not present

## 2022-07-03 DIAGNOSIS — E1165 Type 2 diabetes mellitus with hyperglycemia: Secondary | ICD-10-CM | POA: Diagnosis not present

## 2022-07-03 DIAGNOSIS — Z6838 Body mass index (BMI) 38.0-38.9, adult: Secondary | ICD-10-CM | POA: Diagnosis not present

## 2022-07-03 DIAGNOSIS — I77810 Thoracic aortic ectasia: Secondary | ICD-10-CM | POA: Diagnosis not present

## 2022-07-03 DIAGNOSIS — K047 Periapical abscess without sinus: Secondary | ICD-10-CM | POA: Diagnosis not present

## 2022-07-03 DIAGNOSIS — I10 Essential (primary) hypertension: Secondary | ICD-10-CM | POA: Diagnosis not present

## 2022-07-03 DIAGNOSIS — H9193 Unspecified hearing loss, bilateral: Secondary | ICD-10-CM | POA: Diagnosis not present

## 2022-10-04 DIAGNOSIS — H9193 Unspecified hearing loss, bilateral: Secondary | ICD-10-CM | POA: Diagnosis not present

## 2022-10-04 DIAGNOSIS — Z0000 Encounter for general adult medical examination without abnormal findings: Secondary | ICD-10-CM | POA: Diagnosis not present

## 2022-10-04 DIAGNOSIS — E1165 Type 2 diabetes mellitus with hyperglycemia: Secondary | ICD-10-CM | POA: Diagnosis not present

## 2022-10-04 DIAGNOSIS — E7800 Pure hypercholesterolemia, unspecified: Secondary | ICD-10-CM | POA: Diagnosis not present

## 2022-10-04 DIAGNOSIS — D696 Thrombocytopenia, unspecified: Secondary | ICD-10-CM | POA: Diagnosis not present

## 2022-10-04 DIAGNOSIS — Z6839 Body mass index (BMI) 39.0-39.9, adult: Secondary | ICD-10-CM | POA: Diagnosis not present

## 2022-10-04 DIAGNOSIS — Z125 Encounter for screening for malignant neoplasm of prostate: Secondary | ICD-10-CM | POA: Diagnosis not present

## 2022-10-11 DIAGNOSIS — Z0000 Encounter for general adult medical examination without abnormal findings: Secondary | ICD-10-CM | POA: Diagnosis not present

## 2022-10-11 DIAGNOSIS — I10 Essential (primary) hypertension: Secondary | ICD-10-CM | POA: Diagnosis not present

## 2022-10-11 DIAGNOSIS — D696 Thrombocytopenia, unspecified: Secondary | ICD-10-CM | POA: Diagnosis not present

## 2022-10-11 DIAGNOSIS — Z6838 Body mass index (BMI) 38.0-38.9, adult: Secondary | ICD-10-CM | POA: Diagnosis not present

## 2022-10-11 DIAGNOSIS — R7309 Other abnormal glucose: Secondary | ICD-10-CM | POA: Diagnosis not present

## 2022-10-11 DIAGNOSIS — Z8673 Personal history of transient ischemic attack (TIA), and cerebral infarction without residual deficits: Secondary | ICD-10-CM | POA: Diagnosis not present

## 2022-10-11 DIAGNOSIS — I77810 Thoracic aortic ectasia: Secondary | ICD-10-CM | POA: Diagnosis not present

## 2022-10-11 DIAGNOSIS — H9193 Unspecified hearing loss, bilateral: Secondary | ICD-10-CM | POA: Diagnosis not present

## 2022-10-11 DIAGNOSIS — E119 Type 2 diabetes mellitus without complications: Secondary | ICD-10-CM | POA: Diagnosis not present

## 2023-01-22 DIAGNOSIS — I10 Essential (primary) hypertension: Secondary | ICD-10-CM | POA: Diagnosis not present

## 2023-01-22 DIAGNOSIS — E119 Type 2 diabetes mellitus without complications: Secondary | ICD-10-CM | POA: Diagnosis not present

## 2023-01-22 DIAGNOSIS — I77810 Thoracic aortic ectasia: Secondary | ICD-10-CM | POA: Diagnosis not present

## 2023-01-22 DIAGNOSIS — Z79899 Other long term (current) drug therapy: Secondary | ICD-10-CM | POA: Diagnosis not present

## 2023-01-22 DIAGNOSIS — D649 Anemia, unspecified: Secondary | ICD-10-CM | POA: Diagnosis not present

## 2023-01-22 DIAGNOSIS — D696 Thrombocytopenia, unspecified: Secondary | ICD-10-CM | POA: Diagnosis not present

## 2023-01-22 DIAGNOSIS — Z8673 Personal history of transient ischemic attack (TIA), and cerebral infarction without residual deficits: Secondary | ICD-10-CM | POA: Diagnosis not present

## 2023-01-22 DIAGNOSIS — H9193 Unspecified hearing loss, bilateral: Secondary | ICD-10-CM | POA: Diagnosis not present

## 2023-01-29 DIAGNOSIS — H9193 Unspecified hearing loss, bilateral: Secondary | ICD-10-CM | POA: Diagnosis not present

## 2023-01-29 DIAGNOSIS — I10 Essential (primary) hypertension: Secondary | ICD-10-CM | POA: Diagnosis not present

## 2023-01-29 DIAGNOSIS — R7989 Other specified abnormal findings of blood chemistry: Secondary | ICD-10-CM | POA: Diagnosis not present

## 2023-01-29 DIAGNOSIS — D61818 Other pancytopenia: Secondary | ICD-10-CM | POA: Diagnosis not present

## 2023-01-29 DIAGNOSIS — M5442 Lumbago with sciatica, left side: Secondary | ICD-10-CM | POA: Diagnosis not present

## 2023-01-29 DIAGNOSIS — Z6839 Body mass index (BMI) 39.0-39.9, adult: Secondary | ICD-10-CM | POA: Diagnosis not present

## 2023-01-29 DIAGNOSIS — E1165 Type 2 diabetes mellitus with hyperglycemia: Secondary | ICD-10-CM | POA: Diagnosis not present

## 2023-01-29 DIAGNOSIS — E7800 Pure hypercholesterolemia, unspecified: Secondary | ICD-10-CM | POA: Diagnosis not present

## 2023-01-29 DIAGNOSIS — K7460 Unspecified cirrhosis of liver: Secondary | ICD-10-CM | POA: Diagnosis not present

## 2023-01-29 DIAGNOSIS — G8929 Other chronic pain: Secondary | ICD-10-CM | POA: Diagnosis not present

## 2023-02-08 ENCOUNTER — Emergency Department: Payer: No Typology Code available for payment source

## 2023-02-08 ENCOUNTER — Encounter: Payer: Self-pay | Admitting: Internal Medicine

## 2023-02-08 ENCOUNTER — Other Ambulatory Visit: Payer: Self-pay

## 2023-02-08 ENCOUNTER — Inpatient Hospital Stay
Admission: EM | Admit: 2023-02-08 | Discharge: 2023-02-11 | DRG: 603 | Disposition: A | Discharge status: Home / Self Care | Payer: No Typology Code available for payment source | Attending: Internal Medicine | Admitting: Internal Medicine

## 2023-02-08 DIAGNOSIS — R6883 Chills (without fever): Secondary | ICD-10-CM | POA: Diagnosis not present

## 2023-02-08 DIAGNOSIS — R000 Tachycardia, unspecified: Secondary | ICD-10-CM | POA: Diagnosis not present

## 2023-02-08 DIAGNOSIS — K7460 Unspecified cirrhosis of liver: Secondary | ICD-10-CM | POA: Diagnosis present

## 2023-02-08 DIAGNOSIS — R262 Difficulty in walking, not elsewhere classified: Secondary | ICD-10-CM | POA: Diagnosis present

## 2023-02-08 DIAGNOSIS — M7989 Other specified soft tissue disorders: Secondary | ICD-10-CM | POA: Diagnosis not present

## 2023-02-08 DIAGNOSIS — E785 Hyperlipidemia, unspecified: Secondary | ICD-10-CM | POA: Diagnosis present

## 2023-02-08 DIAGNOSIS — Z888 Allergy status to other drugs, medicaments and biological substances status: Secondary | ICD-10-CM | POA: Diagnosis not present

## 2023-02-08 DIAGNOSIS — M549 Dorsalgia, unspecified: Secondary | ICD-10-CM | POA: Diagnosis present

## 2023-02-08 DIAGNOSIS — Z8673 Personal history of transient ischemic attack (TIA), and cerebral infarction without residual deficits: Secondary | ICD-10-CM | POA: Diagnosis not present

## 2023-02-08 DIAGNOSIS — Z79899 Other long term (current) drug therapy: Secondary | ICD-10-CM | POA: Diagnosis not present

## 2023-02-08 DIAGNOSIS — G8929 Other chronic pain: Secondary | ICD-10-CM | POA: Diagnosis present

## 2023-02-08 DIAGNOSIS — E119 Type 2 diabetes mellitus without complications: Secondary | ICD-10-CM | POA: Diagnosis present

## 2023-02-08 DIAGNOSIS — B957 Other staphylococcus as the cause of diseases classified elsewhere: Secondary | ICD-10-CM | POA: Diagnosis present

## 2023-02-08 DIAGNOSIS — I878 Other specified disorders of veins: Secondary | ICD-10-CM | POA: Diagnosis present

## 2023-02-08 DIAGNOSIS — H9193 Unspecified hearing loss, bilateral: Secondary | ICD-10-CM | POA: Diagnosis not present

## 2023-02-08 DIAGNOSIS — Z7984 Long term (current) use of oral hypoglycemic drugs: Secondary | ICD-10-CM

## 2023-02-08 DIAGNOSIS — I10 Essential (primary) hypertension: Secondary | ICD-10-CM | POA: Diagnosis not present

## 2023-02-08 DIAGNOSIS — K219 Gastro-esophageal reflux disease without esophagitis: Secondary | ICD-10-CM | POA: Diagnosis present

## 2023-02-08 DIAGNOSIS — Z7902 Long term (current) use of antithrombotics/antiplatelets: Secondary | ICD-10-CM | POA: Diagnosis not present

## 2023-02-08 DIAGNOSIS — K7581 Nonalcoholic steatohepatitis (NASH): Secondary | ICD-10-CM | POA: Diagnosis present

## 2023-02-08 DIAGNOSIS — H903 Sensorineural hearing loss, bilateral: Secondary | ICD-10-CM | POA: Diagnosis present

## 2023-02-08 DIAGNOSIS — D696 Thrombocytopenia, unspecified: Secondary | ICD-10-CM | POA: Diagnosis present

## 2023-02-08 DIAGNOSIS — Z6841 Body Mass Index (BMI) 40.0 and over, adult: Secondary | ICD-10-CM | POA: Diagnosis not present

## 2023-02-08 DIAGNOSIS — E66813 Obesity, class 3: Secondary | ICD-10-CM | POA: Insufficient documentation

## 2023-02-08 DIAGNOSIS — L03116 Cellulitis of left lower limb: Principal | ICD-10-CM | POA: Diagnosis present

## 2023-02-08 DIAGNOSIS — A419 Sepsis, unspecified organism: Principal | ICD-10-CM | POA: Diagnosis present

## 2023-02-08 DIAGNOSIS — E1165 Type 2 diabetes mellitus with hyperglycemia: Secondary | ICD-10-CM | POA: Diagnosis not present

## 2023-02-08 DIAGNOSIS — D61818 Other pancytopenia: Secondary | ICD-10-CM | POA: Diagnosis not present

## 2023-02-08 LAB — CBC WITH DIFFERENTIAL/PLATELET
Abs Immature Granulocytes: 0.02 10*3/uL (ref 0.00–0.07)
Basophils Absolute: 0 10*3/uL (ref 0.0–0.1)
Basophils Relative: 0 %
Eosinophils Absolute: 0 10*3/uL (ref 0.0–0.5)
Eosinophils Relative: 0 %
HCT: 41.3 % (ref 39.0–52.0)
Hemoglobin: 13.5 g/dL (ref 13.0–17.0)
Immature Granulocytes: 0 %
Lymphocytes Relative: 11 %
Lymphs Abs: 0.9 10*3/uL (ref 0.7–4.0)
MCH: 31.5 pg (ref 26.0–34.0)
MCHC: 32.7 g/dL (ref 30.0–36.0)
MCV: 96.5 fL (ref 80.0–100.0)
Monocytes Absolute: 1 10*3/uL (ref 0.1–1.0)
Monocytes Relative: 12 %
Neutro Abs: 6.4 10*3/uL (ref 1.7–7.7)
Neutrophils Relative %: 77 %
Platelets: 114 10*3/uL — ABNORMAL LOW (ref 150–400)
RBC: 4.28 MIL/uL (ref 4.22–5.81)
RDW: 14.5 % (ref 11.5–15.5)
WBC: 8.4 10*3/uL (ref 4.0–10.5)
nRBC: 0 % (ref 0.0–0.2)

## 2023-02-08 LAB — COMPREHENSIVE METABOLIC PANEL
ALT: 36 U/L (ref 0–44)
AST: 48 U/L — ABNORMAL HIGH (ref 15–41)
Albumin: 3.6 g/dL (ref 3.5–5.0)
Alkaline Phosphatase: 57 U/L (ref 38–126)
Anion gap: 9 (ref 5–15)
BUN: 26 mg/dL — ABNORMAL HIGH (ref 8–23)
CO2: 23 mmol/L (ref 22–32)
Calcium: 9.1 mg/dL (ref 8.9–10.3)
Chloride: 105 mmol/L (ref 98–111)
Creatinine, Ser: 1.07 mg/dL (ref 0.61–1.24)
GFR, Estimated: >60 mL/min (ref >60)
Glucose, Bld: 131 mg/dL — ABNORMAL HIGH (ref 70–99)
Potassium: 3.6 mmol/L (ref 3.5–5.1)
Sodium: 137 mmol/L (ref 135–145)
Total Bilirubin: 1.7 mg/dL — ABNORMAL HIGH (ref 0.3–1.2)
Total Protein: 7.1 g/dL (ref 6.5–8.1)

## 2023-02-08 LAB — URINALYSIS, W/ REFLEX TO CULTURE (INFECTION SUSPECTED)
Bacteria, UA: NONE SEEN
Bilirubin Urine: NEGATIVE
Glucose, UA: NEGATIVE mg/dL
Ketones, ur: NEGATIVE mg/dL
Leukocytes,Ua: NEGATIVE
Nitrite: NEGATIVE
Protein, ur: NEGATIVE mg/dL
Specific Gravity, Urine: 1.013 (ref 1.005–1.030)
pH: 6 (ref 5.0–8.0)

## 2023-02-08 LAB — LACTIC ACID, PLASMA
Lactic Acid, Venous: 2 mmol/L (ref 0.5–1.9)
Lactic Acid, Venous: 1.8 mmol/L (ref 0.5–1.9)

## 2023-02-08 LAB — PROTIME-INR
INR: 1.4 — ABNORMAL HIGH (ref 0.8–1.2)
Prothrombin Time: 17 seconds — ABNORMAL HIGH (ref 11.4–15.2)

## 2023-02-08 MED ORDER — LOSARTAN POTASSIUM 50 MG PO TABS
50.0000 mg | ORAL_TABLET | Freq: Every day | ORAL | Status: DC
  Administered 2023-02-08 – 2023-02-11 (×4): 50 mg via ORAL
  Filled 2023-02-08 (×4): qty 1

## 2023-02-08 MED ORDER — ACETAMINOPHEN 325 MG PO TABS
650.0000 mg | ORAL_TABLET | Freq: Four times a day (QID) | ORAL | Status: DC | PRN
  Administered 2023-02-08 – 2023-02-09 (×2): 650 mg via ORAL
  Filled 2023-02-08 (×2): qty 2

## 2023-02-08 MED ORDER — VANCOMYCIN HCL 2000 MG/400ML IV SOLN
2000.0000 mg | Freq: Once | INTRAVENOUS | Status: AC
  Administered 2023-02-08: 2000 mg via INTRAVENOUS
  Filled 2023-02-08: qty 400

## 2023-02-08 MED ORDER — SODIUM CHLORIDE 0.9 % IV BOLUS
500.0000 mL | Freq: Once | INTRAVENOUS | Status: AC
  Administered 2023-02-08: 500 mL via INTRAVENOUS

## 2023-02-08 MED ORDER — SODIUM CHLORIDE 0.9 % IV SOLN
2.0000 g | INTRAVENOUS | Status: DC
  Administered 2023-02-08 – 2023-02-10 (×3): 2 g via INTRAVENOUS
  Filled 2023-02-08 (×4): qty 20

## 2023-02-08 MED ORDER — TIZANIDINE HCL 4 MG PO TABS
2.0000 mg | ORAL_TABLET | Freq: Three times a day (TID) | ORAL | Status: DC
  Administered 2023-02-08 – 2023-02-11 (×8): 2 mg via ORAL
  Filled 2023-02-08 (×8): qty 1

## 2023-02-08 MED ORDER — VANCOMYCIN HCL 1500 MG/300ML IV SOLN
1500.0000 mg | INTRAVENOUS | Status: DC
  Administered 2023-02-09 – 2023-02-10 (×2): 1500 mg via INTRAVENOUS
  Filled 2023-02-08 (×3): qty 300

## 2023-02-08 MED ORDER — ACETAMINOPHEN 650 MG RE SUPP: 650.0000 mg | Freq: Four times a day (QID) | RECTAL | Status: DC | PRN

## 2023-02-08 MED ORDER — CARVEDILOL 3.125 MG PO TABS
3.1250 mg | ORAL_TABLET | Freq: Two times a day (BID) | ORAL | Status: DC
  Administered 2023-02-09 – 2023-02-10 (×4): 3.125 mg via ORAL
  Filled 2023-02-08 (×5): qty 1

## 2023-02-08 MED ORDER — ONDANSETRON HCL 4 MG/2ML IJ SOLN: 4.0000 mg | Freq: Four times a day (QID) | INTRAMUSCULAR | Status: DC | PRN

## 2023-02-08 MED ORDER — ATORVASTATIN CALCIUM 20 MG PO TABS
40.0000 mg | ORAL_TABLET | ORAL | Status: DC
  Administered 2023-02-08 – 2023-02-10 (×2): 40 mg via ORAL
  Filled 2023-02-08 (×3): qty 2

## 2023-02-08 MED ORDER — LACTATED RINGERS IV SOLN
150.0000 mL/h | INTRAVENOUS | Status: AC
  Administered 2023-02-08: 150 mL/h via INTRAVENOUS

## 2023-02-08 MED ORDER — ENOXAPARIN SODIUM 60 MG/0.6ML IJ SOSY
60.0000 mg | PREFILLED_SYRINGE | INTRAMUSCULAR | Status: DC
  Administered 2023-02-08 – 2023-02-10 (×3): 60 mg via SUBCUTANEOUS
  Filled 2023-02-08 (×3): qty 0.6

## 2023-02-08 MED ORDER — SODIUM CHLORIDE 0.9 % IV SOLN
2.0000 g | Freq: Once | INTRAVENOUS | Status: AC
  Administered 2023-02-08: 2 g via INTRAVENOUS
  Filled 2023-02-08: qty 20

## 2023-02-08 MED ORDER — CLOPIDOGREL BISULFATE 75 MG PO TABS
75.0000 mg | ORAL_TABLET | Freq: Every day | ORAL | Status: DC
  Administered 2023-02-08 – 2023-02-11 (×4): 75 mg via ORAL
  Filled 2023-02-08 (×4): qty 1

## 2023-02-08 MED ORDER — HYDROCODONE-ACETAMINOPHEN 5-325 MG PO TABS
1.0000 | ORAL_TABLET | ORAL | Status: DC | PRN
  Administered 2023-02-10: 1 via ORAL
  Administered 2023-02-10 – 2023-02-11 (×3): 2 via ORAL
  Filled 2023-02-08 (×3): qty 2
  Filled 2023-02-08: qty 1

## 2023-02-08 MED ORDER — ONDANSETRON HCL 4 MG PO TABS: 4.0000 mg | ORAL_TABLET | Freq: Four times a day (QID) | ORAL | Status: DC | PRN

## 2023-02-08 NOTE — Progress Notes (Signed)
Pharmacy Antibiotic Note  Jordan Chang is a 82 y.o. male admitted on 02/08/2023 with cellulitis.  Pharmacy has been consulted for Vancomycin dosing. One-time doses of Ceftriaxone and Vancomycin ordered per ED provider.  Plan: Ceftriaxone 2g IV q24h per MD Vancomycin 1500 mg IV q24h (Scr used 1.07, eAUC 512.7, goal AUC 400-550) Monitor renal function and obtain Vancomycin levels as clinically indicated Follow-up cultures and de-escalate therapy as able    Height: 5\' 8"  (172.7 cm) Weight: 126.1 kg (278 lb) IBW/kg (Calculated) : 68.4  Temp (24hrs), Avg:99.2 F (37.3 C), Min:98.1 F (36.7 C), Max:100.3 F (37.9 C)  Recent Labs  Lab 02/08/23 1519 02/08/23 1826  WBC 8.4  --   CREATININE 1.07  --   LATICACIDVEN 2.0* 1.8    Estimated Creatinine Clearance: 67.7 mL/min (by C-G formula based on SCr of 1.07 mg/dL).    Allergies  Allergen Reactions   Atorvastatin Shortness Of Breath and Other (See Comments)    Dyspnea    Other Palpitations and Other (See Comments)    STEROIDS caused tachycardia (exact one not recalled)    Antimicrobials this admission: Ceftriaxone 8/15 >>  Vancomycin 8/15 >>   Microbiology results: 8/15 BCx: in process   Thank you for allowing pharmacy to be a part of this patient's care.  Jerrilyn Cairo, PharmD 02/08/2023 9:38 PM

## 2023-02-08 NOTE — Assessment & Plan Note (Signed)
Sliding scale insulin coverage.  Hold home metformin ?

## 2023-02-08 NOTE — Assessment & Plan Note (Signed)
Complicating factor to overall prognosis and care 

## 2023-02-08 NOTE — ED Notes (Signed)
See triage note, pt to ED for bilateral leg swelling for "awhile". Reports left leg was red last night and became concerned about DVT or sepsis. Reports improvement of redness at this time.

## 2023-02-08 NOTE — Assessment & Plan Note (Signed)
Increase nursing assistance for communication

## 2023-02-08 NOTE — Assessment & Plan Note (Signed)
Keep legs elevated

## 2023-02-08 NOTE — Progress Notes (Signed)
PHARMACY -  BRIEF ANTIBIOTIC NOTE   Pharmacy has received consult(s) for Vancomycin from an ED provider.  The patient's profile has been reviewed for ht/wt/allergies/indication/available labs.    One time order(s) placed for Vancomycin 2 g IV.   Further antibiotics/pharmacy consults should be ordered by admitting physician if indicated.                       Thank you, Jerrilyn Cairo, PharmD 02/08/2023 6:41 PM

## 2023-02-08 NOTE — H&P (Addendum)
History and Physical    Patient: Jordan Chang:[id number] DOB: 1941/05/17 DOA: 02/08/2023 DOS: the patient was seen and examined on 02/08/2023 PCP: Barbette Reichmann, MD  Patient coming from: Home  Chief Complaint:  Chief Complaint  Patient presents with   Leg Swelling    HPI: Jordan Chang is a 82 y.o. male with medical history significant for DM, HTN, prior stroke, morbid obesity, NASH cirrhosis, chronic venous stasis, chronic back pain with prior lumbar laminectomy, and with history of hospitalization in 05/2022  with sepsis secondary to facial cellulitis who presents to the ED with concern for  sepsis after he developed left lower extremity pain, redness, warmth and swelling over the past 24 hours, associated with chills and nausea.  Wife at bedside contributes to history. ED course andData review: Tmax 100.3 with pulse 102 with otherwise normal vitals. Labs: CBC unremarkable except for low platelets of 114 which is his baseline.  CMP mostly unremarkable except for mild AST elevation to 48 and total bili of 1.7.  INR 1.4, urinalysis unremarkable.  Lactic acid 1.8. Lower extremity venous ultrasound negative for DVT Chest x-ray nonacute Patient started on sepsis fluids Rocephin and vancomycin. Hospitalist consulted for admission.   Review of Systems: As mentioned in the history of present illness. All other systems reviewed and are negative.  Past Medical History:  Diagnosis Date   GERD (gastroesophageal reflux disease)    occ   Hyperlipemia    Hypertension    Past Surgical History:  Procedure Laterality Date   BACK SURGERY  05/2011   EYE SURGERY     cataracts   LUMBAR LAMINECTOMY/DECOMPRESSION MICRODISCECTOMY Left 01/20/2016   Procedure: Left Lumbar two-three, Lumbar three-four,  Lumbar four-five Microdiskectomy;  Surgeon: Tressie Stalker, MD;  Location: [facility];  Service: Neurosurgery;  Laterality: Left;   Social History:  reports that he has never smoked. He  has never used smokeless tobacco. He reports that he does not currently use alcohol. He reports that he does not use drugs.  Allergies  Allergen Reactions   Atorvastatin Shortness Of Breath and Other (See Comments)    Dyspnea    Other Palpitations and Other (See Comments)    STEROIDS caused tachycardia (exact one not recalled)    No family history on file.  Prior to Admission medications   Medication Sig Start Date End Date Taking? Authorizing Provider  acetaminophen (TYLENOL) 325 MG tablet Take 2 tablets (650 mg total) by mouth every 4 (four) hours as needed for mild pain (or temp > 37.5 C (99.5 F)). 11/12/19   Angiulli, Mcarthur Rossetti, PA-C  atorvastatin (LIPITOR) 40 MG tablet Take 1 tablet (40 mg total) by mouth every other day. 11/12/19 06/23/22  Angiulli, Mcarthur Rossetti, PA-C  carvedilol (COREG) 3.125 MG tablet Take 3.125 mg by mouth 2 (two) times daily with a meal. 05/15/22   [provider]  clopidogrel (PLAVIX) 75 MG tablet Take 1 tablet (75 mg total) by mouth daily. 11/12/19   Angiulli, Mcarthur Rossetti, PA-C  losartan (COZAAR) 50 MG tablet Take 50 mg by mouth daily.    [provider]  metFORMIN (GLUCOPHAGE-XR) 500 MG 24 hr tablet Take 500 mg by mouth daily. 06/07/22   [provider]  tiZANidine (ZANAFLEX) 2 MG tablet Take 2 mg by mouth 3 (three) times daily. 05/01/22   [provider]    Physical Exam: Vitals:   02/08/23 1922 02/08/23 1930 02/08/23 2000 02/08/23 2030  BP:  (!) 184/89 (!) 157/73 (!) 159/63  Pulse:  89 86 89  Resp:      Temp:      TempSrc:      SpO2: 100% 100% 97% 99%  Weight:      Height:       Physical Exam Vitals and nursing note reviewed.  Constitutional:      General: He is not in acute distress. HENT:     Head: Normocephalic and atraumatic.  Cardiovascular:     Rate and Rhythm: Normal rate and regular rhythm.     Heart sounds: Normal heart sounds.  Pulmonary:     Effort: Pulmonary effort is normal.     Breath sounds: Normal  breath sounds.  Abdominal:     Palpations: Abdomen is soft.     Tenderness: There is no abdominal tenderness.  Musculoskeletal:     Right lower leg: Edema present.     Left lower leg: Edema present.     Comments: See picture below  Neurological:     Mental Status: Mental status is at baseline.     Labs on Admission: I have personally reviewed following labs and imaging studies  CBC: Recent Labs  Lab 02/08/23 1519  WBC 8.4  NEUTROABS 6.4  HGB 13.5  HCT 41.3  MCV 96.5  PLT 114*   Basic Metabolic Panel: Recent Labs  Lab 02/08/23 1519  NA 137  K 3.6  CL 105  CO2 23  GLUCOSE 131*  BUN 26*  CREATININE 1.07  CALCIUM 9.1   GFR: Estimated Creatinine Clearance: 67.7 mL/min (by C-G formula based on SCr of 1.07 mg/dL). Liver Function Tests: Recent Labs  Lab 02/08/23 1519  AST 48*  ALT 36  ALKPHOS 57  BILITOT 1.7*  PROT 7.1  ALBUMIN 3.6   No results for input(s): "LIPASE", "AMYLASE" in the last 168 hours. No results for input(s): "AMMONIA" in the last 168 hours. Coagulation Profile: Recent Labs  Lab 02/08/23 1519  INR 1.4*   Cardiac Enzymes: No results for input(s): "CKTOTAL", "CKMB", "CKMBINDEX", "TROPONINI" in the last 168 hours. BNP (last 3 results) No results for input(s): "PROBNP" in the last 8760 hours. HbA1C: No results for input(s): "HGBA1C" in the last 72 hours. CBG: No results for input(s): "GLUCAP" in the last 168 hours. Lipid Profile: No results for input(s): "CHOL", "HDL", "LDLCALC", "TRIG", "CHOLHDL", "LDLDIRECT" in the last 72 hours. Thyroid Function Tests: No results for input(s): "TSH", "T4TOTAL", "FREET4", "T3FREE", "THYROIDAB" in the last 72 hours. Anemia Panel: No results for input(s): "VITAMINB12", "FOLATE", "FERRITIN", "TIBC", "IRON", "RETICCTPCT" in the last 72 hours. Urine analysis:    Component Value Date/Time   COLORURINE YELLOW (A) 02/08/2023 1826   APPEARANCEUR CLEAR (A) 02/08/2023 1826   LABSPEC 1.013 02/08/2023 1826    PHURINE 6.0 02/08/2023 1826   GLUCOSEU NEGATIVE 02/08/2023 1826   HGBUR SMALL (A) 02/08/2023 1826   BILIRUBINUR NEGATIVE 02/08/2023 1826   KETONESUR NEGATIVE 02/08/2023 1826   PROTEINUR NEGATIVE 02/08/2023 1826   NITRITE NEGATIVE 02/08/2023 1826   LEUKOCYTESUR NEGATIVE 02/08/2023 1826    Radiological Exams on Admission: US Venous Img Lower Unilateral Left  Result Date: 02/08/2023 CLINICAL DATA:  Left lower extremity leg swelling EXAM: LEFT LOWER EXTREMITY VENOUS DOPPLER ULTRASOUND TECHNIQUE: Gray-scale sonography with graded compression, as well as color Doppler and duplex ultrasound were performed to evaluate the lower extremity deep venous systems from the level of the common femoral vein and including the common femoral, femoral, profunda femoral, popliteal and calf veins including the posterior tibial, peroneal and gastrocnemius veins when  visible. The superficial great saphenous vein was also interrogated. Spectral Doppler was utilized to evaluate flow at rest and with distal augmentation maneuvers in the common femoral, femoral and popliteal veins. COMPARISON:  None Available. FINDINGS: Contralateral Common Femoral Vein: Respiratory phasicity is normal and symmetric with the symptomatic side. No evidence of thrombus. Normal compressibility. Common Femoral Vein: No evidence of thrombus. Normal compressibility, respiratory phasicity and response to augmentation. Saphenofemoral Junction: No evidence of thrombus. Normal compressibility and flow on color Doppler imaging. Profunda Femoral Vein: No evidence of thrombus. Normal compressibility and flow on color Doppler imaging. Femoral Vein: No evidence of thrombus. Normal compressibility, respiratory phasicity and response to augmentation. Popliteal Vein: No evidence of thrombus. Normal compressibility, respiratory phasicity and response to augmentation. Calf Veins: No evidence of thrombus. Normal compressibility and flow on color Doppler imaging.  Superficial Great Saphenous Vein: No evidence of thrombus. Normal compressibility. Venous Reflux:  None. Other Findings:  None. IMPRESSION: No evidence of deep venous thrombosis. Electronically Signed   By: Alcide Clever M.D.   On: 02/08/2023 20:11   DG Chest 2 View  Result Date: 02/08/2023 CLINICAL DATA:  Leg swelling.  Sepsis EXAM: CHEST - 2 VIEW COMPARISON:  X-ray 02/29/2016 FINDINGS: The heart size and mediastinal contours are within normal limits. Both lungs are clear. No consolidation, pneumothorax or effusion. No edema. The visualized skeletal structures are unremarkable. Calcified aorta. IMPRESSION: No acute cardiopulmonary disease Electronically Signed   By: Karen Kays M.D.   On: 02/08/2023 17:24     Data Reviewed: Relevant notes from primary care and specialist visits, past discharge summaries as available in EHR, including Care Everywhere. Prior diagnostic testing as pertinent to current admission diagnoses Updated medications and problem lists for reconciliation ED course, including vitals, labs, imaging, treatment and response to treatment Triage notes, nursing and pharmacy notes and ED provider's notes Notable results as noted in HPI   Assessment and Plan: Sepsis due to cellulitis left lower extremity(HCC) Continue Rocephin and vancomycin Sepsis fluids Keep leg elevated  Perceptive hearing loss, both sides Increase nursing assistance for communication  Chronic venous stasis Keep legs elevated  Liver cirrhosis secondary to NASH (HCC) Thrombocytopenia, stable CMP mostly unremarkable except for mild AST elevation to 48 and total bili of 1.7.  INR 1.4,  Obesity, Class III, BMI 40-49.9 (morbid obesity) (HCC) Complicating factor to overall prognosis and care  History of CVA (cerebrovascular accident) Continue atorvastatin and Plavix  Type 2 diabetes mellitus without complications (HCC) Sliding scale insulin coverage.  Hold home metformin  Essential  hypertension Continue home losartan and carvedilol  Chronic back pain Continue tizanidine and Tylenol    DVT prophylaxis: Lovenox  Consults: none  Advance Care Planning:   Code Status: Prior   Family Communication: wife at bedside  Disposition Plan: Back to previous home environment  Severity of Illness: The appropriate patient status for this patient is INPATIENT. Inpatient status is judged to be reasonable and necessary in order to provide the required intensity of service to ensure the patient's safety. The patient's presenting symptoms, physical exam findings, and initial radiographic and laboratory data in the context of their chronic comorbidities is felt to place them at high risk for further clinical deterioration. Furthermore, it is not anticipated that the patient will be medically stable for discharge from the hospital within 2 midnights of admission.   * I certify that at the point of admission it is my clinical judgment that the patient will require inpatient hospital care spanning beyond 2 midnights  from the point of admission due to high intensity of service, high risk for further deterioration and high frequency of surveillance required.*  Author: Andris Baumann, MD 02/08/2023 9:11 PM  For on call review [email or web address].

## 2023-02-08 NOTE — Assessment & Plan Note (Signed)
Continue tizanidine and Tylenol

## 2023-02-08 NOTE — Assessment & Plan Note (Deleted)
Stable at 115,000

## 2023-02-08 NOTE — Assessment & Plan Note (Addendum)
Thrombocytopenia, stable CMP mostly unremarkable except for mild AST elevation to 48 and total bili of 1.7.  INR 1.4,

## 2023-02-08 NOTE — ED Provider Notes (Signed)
[facility] Provider Note    Event Date/Time   First MD Initiated Contact with Patient 02/08/23 1743     (approximate)   History   Leg Swelling   HPI  Jordan Chang is a 82 y.o. male with past medical history of hypertension, hyperlipidemia, GERD, here with left leg swelling and redness.  The patient states that over the last 24 hours he has had diffuse chills, fatigue, and left leg swelling.  Over just the last 12 hours, his left leg has become increasingly swollen, red, and painful.  He had difficulty walking on it.  Has history of sepsis due to cellulitis in the past so wife presents for evaluation.  Denies any trauma to the area.  No numbness or weakness.     Physical Exam   Triage Vital Signs: ED Triage Vitals  Encounter Vitals Group     BP 02/08/23 1516 (!) 148/66     Systolic BP Percentile --      Diastolic BP Percentile --      Pulse Rate 02/08/23 1516 (!) 102     Resp 02/08/23 1516 19     Temp 02/08/23 1516 100.3 F (37.9 C)     Temp Source 02/08/23 1516 Oral     SpO2 02/08/23 1516 96 %     Weight 02/08/23 1520 278 lb (126.1 kg)     Height 02/08/23 1520 5\' 8"  (1.727 m)     Head Circumference --      Peak Flow --      Pain Score 02/08/23 1520 0     Pain Loc --      Pain Education --      Exclude from Growth Chart --     Most recent vital signs: Vitals:   02/08/23 2100 02/08/23 2307  BP: (!) 159/71 (!) 163/69  Pulse: 90 93  Resp: 17   Temp:  98 F (36.7 C)  SpO2: 98% 99%     General: Awake, no distress.  CV:  Good peripheral perfusion.  Tachycardic. Resp:  Normal work of breathing.  Mild tachypnea. Abd:  No distention.  No tenderness. Other:  Significant erythema and asymmetric edema of the left lower extremity, with warmth most pronounced along the posterior aspect of the lower leg.  No open or draining wounds.   ED Results / Procedures / Treatments   Labs (all labs ordered are listed, but only abnormal results are  displayed) Labs Reviewed  COMPREHENSIVE METABOLIC PANEL - Abnormal; Notable for the following components:      Result Value   Glucose, Bld 131 (*)    BUN 26 (*)    AST 48 (*)    Total Bilirubin 1.7 (*)    All other components within normal limits  LACTIC ACID, PLASMA - Abnormal; Notable for the following components:   Lactic Acid, Venous 2.0 (*)    All other components within normal limits  CBC WITH DIFFERENTIAL/PLATELET - Abnormal; Notable for the following components:   Platelets 114 (*)    All other components within normal limits  PROTIME-INR - Abnormal; Notable for the following components:   Prothrombin Time 17.0 (*)    INR 1.4 (*)    All other components within normal limits  URINALYSIS, W/ REFLEX TO CULTURE (INFECTION SUSPECTED) - Abnormal; Notable for the following components:   Color, Urine YELLOW (*)    APPearance CLEAR (*)    Hgb urine dipstick SMALL (*)    All other components within  normal limits  CULTURE, BLOOD (ROUTINE X 2)  CULTURE, BLOOD (ROUTINE X 2)  LACTIC ACID, PLASMA  PROCALCITONIN  BASIC METABOLIC PANEL  CBC     EKG    RADIOLOGY Chest x-ray: Negative Ultrasound:Negative  I also independently reviewed and agree with radiologist interpretations.   PROCEDURES:  Critical Care performed: Yes, see critical care procedure note(s)  .Critical Care  Performed by: Shaune Pollack, MD Authorized by: Shaune Pollack, MD   Critical care provider statement:    Critical care time (minutes):  30   Critical care time was exclusive of:  Separately billable procedures and treating other patients   Critical care was necessary to treat or prevent imminent or life-threatening deterioration of the following conditions:  Circulatory failure, cardiac failure, respiratory failure and sepsis   Critical care was time spent personally by me on the following activities:  Development of treatment plan with patient or surrogate, discussions with consultants, evaluation  of patient's response to treatment, examination of patient, ordering and review of laboratory studies, ordering and review of radiographic studies, ordering and performing treatments and interventions, pulse oximetry, re-evaluation of patient's condition and review of old charts     MEDICATIONS ORDERED IN ED: Medications  atorvastatin (LIPITOR) tablet 40 mg (40 mg Oral Given 02/08/23 2334)  carvedilol (COREG) tablet 3.125 mg (has no administration in time range)  losartan (COZAAR) tablet 50 mg (50 mg Oral Given 02/08/23 2335)  clopidogrel (PLAVIX) tablet 75 mg (75 mg Oral Given 02/08/23 2335)  tiZANidine (ZANAFLEX) tablet 2 mg (2 mg Oral Given 02/08/23 2256)  lactated ringers infusion (150 mL/hr Intravenous New Bag/Given 02/08/23 2347)  enoxaparin (LOVENOX) injection 60 mg (60 mg Subcutaneous Given 02/08/23 2256)  cefTRIAXone (ROCEPHIN) 2 g in sodium chloride 0.9 % 100 mL IVPB (2 g Intravenous New Bag/Given 02/08/23 2345)  acetaminophen (TYLENOL) tablet 650 mg (650 mg Oral Given 02/08/23 2257)    Or  acetaminophen (TYLENOL) suppository 650 mg ( Rectal See Alternative 02/08/23 2257)  ondansetron (ZOFRAN) tablet 4 mg (has no administration in time range)    Or  ondansetron (ZOFRAN) injection 4 mg (has no administration in time range)  HYDROcodone-acetaminophen (NORCO/VICODIN) 5-325 MG per tablet 1-2 tablet (has no administration in time range)  vancomycin (VANCOREADY) IVPB 1500 mg/300 mL (has no administration in time range)  sodium chloride 0.9 % bolus 500 mL (0 mLs Intravenous Stopped 02/08/23 1921)  cefTRIAXone (ROCEPHIN) 2 g in sodium chloride 0.9 % 100 mL IVPB (0 g Intravenous Stopped 02/08/23 1921)  vancomycin (VANCOREADY) IVPB 2000 mg/400 mL (0 mg Intravenous Stopped 02/08/23 2156)     IMPRESSION / MDM / ASSESSMENT AND PLAN / ED COURSE  I reviewed the triage vital signs and the nursing notes.                              Differential diagnosis includes, but is not limited to, sepsis 2/2  cellulitis, vasculitis, DVT, asymmetric lymphedema/lymphangitis, occult MSK injury  Patient's presentation is most consistent with acute presentation with potential threat to life or bodily function.  The patient is on the cardiac monitor to evaluate for evidence of arrhythmia and/or significant heart rate changes  82 yo M here with leg pain and swelling. Pt febrile, tachycardic, with mild lactic acidosis consistent with sepsis 2/2 cellulitis. BP is normal. No signs of abscess, nec fasc on exam. DVT study obtained, reviewed, and is negative. CXR clear. Will place on empiric ABX and admit given  age, comorbidities, sepsis physiology.   FINAL CLINICAL IMPRESSION(S) / ED DIAGNOSES   Final diagnoses:  Cellulitis of left leg     Rx / DC Orders   ED Discharge Orders     None        Note:  This document was prepared using Dragon voice recognition software and may include unintentional dictation errors.   Shaune Pollack, MD 02/09/23 [phone]

## 2023-02-08 NOTE — Assessment & Plan Note (Addendum)
Continue atorvastatin and Plavix. ?

## 2023-02-08 NOTE — Assessment & Plan Note (Signed)
Continue home losartan and carvedilol

## 2023-02-08 NOTE — ED Triage Notes (Signed)
Pt woke up with BLE swelling this morning and erythema to LLE. Wife concerned because pt was admitted for sepsis Dec to Jan.

## 2023-02-08 NOTE — Assessment & Plan Note (Addendum)
Continue Rocephin and vancomycin Sepsis fluids Keep leg elevated

## 2023-02-09 DIAGNOSIS — L03116 Cellulitis of left lower limb: Secondary | ICD-10-CM | POA: Diagnosis not present

## 2023-02-09 LAB — BASIC METABOLIC PANEL
Anion gap: 7 (ref 5–15)
BUN: 26 mg/dL — ABNORMAL HIGH (ref 8–23)
CO2: 23 mmol/L (ref 22–32)
Calcium: 8.4 mg/dL — ABNORMAL LOW (ref 8.9–10.3)
Chloride: 110 mmol/L (ref 98–111)
Creatinine, Ser: 1.09 mg/dL (ref 0.61–1.24)
GFR, Estimated: >60 mL/min (ref >60)
Glucose, Bld: 123 mg/dL — ABNORMAL HIGH (ref 70–99)
Potassium: 3.5 mmol/L (ref 3.5–5.1)
Sodium: 140 mmol/L (ref 135–145)

## 2023-02-09 LAB — CBC
HCT: 32.7 % — ABNORMAL LOW (ref 39.0–52.0)
Hemoglobin: 11 g/dL — ABNORMAL LOW (ref 13.0–17.0)
MCH: 31.5 pg (ref 26.0–34.0)
MCHC: 33.6 g/dL (ref 30.0–36.0)
MCV: 93.7 fL (ref 80.0–100.0)
Platelets: 97 10*3/uL — ABNORMAL LOW (ref 150–400)
RBC: 3.49 MIL/uL — ABNORMAL LOW (ref 4.22–5.81)
RDW: 14.5 % (ref 11.5–15.5)
WBC: 4.4 10*3/uL (ref 4.0–10.5)
nRBC: 0 % (ref 0.0–0.2)

## 2023-02-09 LAB — GLUCOSE, CAPILLARY
Glucose-Capillary: 144 mg/dL — ABNORMAL HIGH (ref 70–99)
Glucose-Capillary: 146 mg/dL — ABNORMAL HIGH (ref 70–99)
Glucose-Capillary: 75 mg/dL (ref 70–99)
Glucose-Capillary: 121 mg/dL — ABNORMAL HIGH (ref 70–99)

## 2023-02-09 LAB — PROCALCITONIN: Procalcitonin: <0.1 ng/mL

## 2023-02-09 MED ORDER — INSULIN ASPART 100 UNIT/ML IJ SOLN: 0.0000 [IU] | Freq: Every day | INTRAMUSCULAR | Status: DC

## 2023-02-09 MED ORDER — INSULIN ASPART 100 UNIT/ML IJ SOLN
0.0000 [IU] | Freq: Three times a day (TID) | INTRAMUSCULAR | Status: DC
  Administered 2023-02-09: 1 [IU] via SUBCUTANEOUS
  Filled 2023-02-09: qty 1

## 2023-02-09 NOTE — Plan of Care (Signed)

## 2023-02-09 NOTE — Progress Notes (Signed)
PHARMACY - PHYSICIAN COMMUNICATION CRITICAL VALUE ALERT - BLOOD CULTURE IDENTIFICATION (BCID)  Results for orders placed or performed during the hospital encounter of 02/08/23  Culture, blood (Routine x 2)     Status: Abnormal (Preliminary result)   Collection Time: 02/08/23  6:26 PM   Specimen: BLOOD  Result Value Ref Range Status   Specimen Description BLOOD BLOOD LEFT ARM  Final   Special Requests   Final    BOTTLES DRAWN AEROBIC AND ANAEROBIC Blood Culture results may not be optimal due to an excessive volume of blood received in culture bottles   Culture  Setup Time (A)  Final    STAPHYLOCOCCUS SPECIES GRAM POSITIVE COCCI ANAEROBIC BOTTLE ONLY CRITICAL RESULT CALLED TO, READ BACK BY AND VERIFIED WITHDawayne Cirri 1813 02/09/23 [facility] Performed at [facility], [address] [place], [place] [address]    Culture GRAM POSITIVE COCCI  Final   Report Status PENDING  Incomplete  Culture, blood (Routine x 2)     Status: None (Preliminary result)   Collection Time: 02/08/23  6:26 PM   Specimen: BLOOD  Result Value Ref Range Status   Specimen Description BLOOD BLOOD LEFT ARM  Final   Special Requests   Final    BOTTLES DRAWN AEROBIC AND ANAEROBIC Blood Culture results may not be optimal due to an excessive volume of blood received in culture bottles   Culture   Final    NO GROWTH < 12 HOURS Performed at [facility], [address] [place], [place] [address]    Report Status PENDING  Incomplete    BCID Results: 1 (aerobic) bottle of 4 w/ Staph Species.  Pt currently on Ceftriaxone and Vancomycin.    Name of provider: Hilda Blades, MD  Changes to prescribed antibiotics required: Continue on Rocephin and vancomycin. If improvement will switch to p.o. antibiotics in the morning per MD.   Otelia Sergeant, PharmD, [facility] 02/09/2023 6:26 PM

## 2023-02-09 NOTE — Progress Notes (Signed)
  Progress Note   Patient: Jordan Chang VZD:[id number] DOB: 02/14/1941 DOA: 02/08/2023     1 DOS: the patient was seen and examined on 02/09/2023   Brief hospital course:  82 y.o. male with medical history significant for DM, HTN, prior stroke, morbid obesity, NASH cirrhosis, chronic venous stasis, chronic back pain with prior lumbar laminectomy, and with history of hospitalization in 05/2022  with sepsis secondary to facial cellulitis who presents to the ED with concern for  sepsis after he developed left lower extremity pain, redness, warmth and swelling over the past 24 hours, associated with chills and nausea.  Wife at bedside contributes to history. ED course andData review: Tmax 100.3 with pulse 102 with otherwise normal vitals. Labs: CBC unremarkable except for low platelets of 114 which is his baseline.  CMP mostly unremarkable except for mild AST elevation to 48 and total bili of 1.7.  INR 1.4, urinalysis unremarkable.  Lactic acid 1.8. Lower extremity venous ultrasound negative for DVT Chest x-ray nonacute Patient started on sepsis fluids Rocephin and vancomycin.  8/16 : Patient continues to stay on Rocephin and vancomycin, area of cellulitis demarcated.  If improvement will switch to p.o. antibiotics in the morning with possible discharge in next 24 to 48 hours.  Assessment and Plan:  Sepsis due to cellulitis left lower extremity Continue Rocephin and vancomycin Sepsis fluids Keep leg elevated  Perceptive hearing loss, both sides Increase nursing assistance for communication  Chronic venous stasis Keep legs elevated  Liver cirrhosis secondary to NASH  Thrombocytopenia, stable CMP mostly unremarkable except for mild AST elevation to 48 and total bili of 1.7.  INR 1.4,  Obesity, Class III, BMI 40-49.9 (morbid obesity)  Complicating factor to overall prognosis and care  History of CVA (cerebrovascular accident) Continue atorvastatin and Plavix  Type 2 diabetes mellitus  without complications  Sliding scale insulin coverage.  Hold home metformin  Essential hypertension Continue home losartan and carvedilol  Chronic back pain Continue tizanidine and Tylenol      Subjective: Patient seen and examined by bedside this morning.  No active complaints.  Had mild improvement in symptoms.  No tenderness around the cellulitic area.  Physical Exam: Vitals:   02/08/23 2030 02/08/23 2100 02/08/23 2307 02/09/23 0844  BP: (!) 159/63 (!) 159/71 (!) 163/69 (!) 133/58  Pulse: 89 90 93 60  Resp:  17  17  Temp:   98 F (36.7 C) 97.8 F (36.6 C)  TempSrc:   Oral   SpO2: 99% 98% 99% 98%  Weight:      Height:       Not in acute distress, normal cephalic asymptomatic head, S1-S2 regular with no grade 3 murmur, equal 80 bilaterally, bilateral edema with erythema and warmth on the left.  Mental state at baseline no neurological deficit, facial symmetry preserved.  Data Reviewed:  There are no new results to review at this time.  Family Communication: No family by bedside  Disposition: Status is: Inpatient Remains inpatient appropriate because: Cellulitis  Planned Discharge Destination: Home    Time spent: 32 minutes  Author: Kirstie Peri, MD 02/09/2023 2:10 PM  For on call review [email or web address].

## 2023-02-10 DIAGNOSIS — L03116 Cellulitis of left lower limb: Secondary | ICD-10-CM | POA: Diagnosis not present

## 2023-02-10 LAB — BLOOD CULTURE ID PANEL (REFLEXED) - BCID2

## 2023-02-10 LAB — COMPREHENSIVE METABOLIC PANEL
ALT: 30 U/L (ref 0–44)
AST: 44 U/L — ABNORMAL HIGH (ref 15–41)
Albumin: 2.8 g/dL — ABNORMAL LOW (ref 3.5–5.0)
Alkaline Phosphatase: 49 U/L (ref 38–126)
Anion gap: 5 (ref 5–15)
BUN: 27 mg/dL — ABNORMAL HIGH (ref 8–23)
CO2: 22 mmol/L (ref 22–32)
Calcium: 8.5 mg/dL — ABNORMAL LOW (ref 8.9–10.3)
Chloride: 111 mmol/L (ref 98–111)
Creatinine, Ser: 1.17 mg/dL (ref 0.61–1.24)
GFR, Estimated: >60 mL/min (ref >60)
Glucose, Bld: 96 mg/dL (ref 70–99)
Potassium: 4 mmol/L (ref 3.5–5.1)
Sodium: 138 mmol/L (ref 135–145)
Total Bilirubin: 0.7 mg/dL (ref 0.3–1.2)
Total Protein: 5.7 g/dL — ABNORMAL LOW (ref 6.5–8.1)

## 2023-02-10 LAB — GLUCOSE, CAPILLARY
Glucose-Capillary: 114 mg/dL — ABNORMAL HIGH (ref 70–99)
Glucose-Capillary: 87 mg/dL (ref 70–99)
Glucose-Capillary: 93 mg/dL (ref 70–99)
Glucose-Capillary: 95 mg/dL (ref 70–99)
Glucose-Capillary: 89 mg/dL (ref 70–99)

## 2023-02-10 LAB — CBC
HCT: 36.8 % — ABNORMAL LOW (ref 39.0–52.0)
Hemoglobin: 11.8 g/dL — ABNORMAL LOW (ref 13.0–17.0)
MCH: 31.4 pg (ref 26.0–34.0)
MCHC: 32.1 g/dL (ref 30.0–36.0)
MCV: 97.9 fL (ref 80.0–100.0)
Platelets: 99 10*3/uL — ABNORMAL LOW (ref 150–400)
RBC: 3.76 MIL/uL — ABNORMAL LOW (ref 4.22–5.81)
RDW: 14.3 % (ref 11.5–15.5)
WBC: 3.7 10*3/uL — ABNORMAL LOW (ref 4.0–10.5)
nRBC: 0 % (ref 0.0–0.2)

## 2023-02-10 MED ORDER — SENNOSIDES-DOCUSATE SODIUM 8.6-50 MG PO TABS
2.0000 | ORAL_TABLET | Freq: Every evening | ORAL | Status: DC | PRN
  Administered 2023-02-10: 2 via ORAL
  Filled 2023-02-10: qty 2

## 2023-02-10 NOTE — Plan of Care (Signed)
  Problem: Activity: Goal: Risk for activity intolerance will decrease Outcome: Progressing   Problem: Nutrition: Goal: Adequate nutrition will be maintained Outcome: Progressing   Problem: Coping: Goal: Level of anxiety will decrease Outcome: Progressing   Problem: Pain Managment: Goal: General experience of comfort will improve Outcome: Progressing   Problem: Safety: Goal: Ability to remain free from injury will improve Outcome: Progressing   

## 2023-02-10 NOTE — Progress Notes (Signed)
Progress Note   Patient: Jordan Chang ZOX:[id number] DOB: Feb 12, 1941 DOA: 02/08/2023     2 DOS: the patient was seen and examined on 02/10/2023   Brief hospital course:  82 y.o. male with medical history significant for DM, HTN, prior stroke, morbid obesity, NASH cirrhosis, chronic venous stasis, chronic back pain with prior lumbar laminectomy, and with history of hospitalization in 05/2022  with sepsis secondary to facial cellulitis who presents to the ED with concern for  sepsis after he developed left lower extremity pain, redness, warmth and swelling over the past 24 hours, associated with chills and nausea.  Wife at bedside contributes to history. ED course andData review: Tmax 100.3 with pulse 102 with otherwise normal vitals. Labs: CBC unremarkable except for low platelets of 114 which is his baseline.  CMP mostly unremarkable except for mild AST elevation to 48 and total bili of 1.7.  INR 1.4, urinalysis unremarkable.  Lactic acid 1.8. Lower extremity venous ultrasound negative for DVT Chest x-ray nonacute Patient started on sepsis fluids Rocephin and vancomycin.  8/16 : Patient continues to stay on Rocephin and vancomycin, area of cellulitis demarcated.  If improvement will switch to p.o. antibiotics in the morning with possible discharge in next 24 to 48 hours.  8/17 : Patient in mild improvement in the skin lesions.  Blood cultures 1 bottle positive for staph species out of 4.  Patient wants to stay on IV antibiotics for another day as he had prolonged course in the past.  Will switch to Augmentin Doxy tomorrow if resolution noticed.  Assessment and Plan:  Sepsis due to cellulitis left lower extremity Continue Rocephin and vancomycin Sepsis fluids Keep leg elevated  Perceptive hearing loss, both sides Increase nursing assistance for communication  Chronic venous stasis Keep legs elevated  Liver cirrhosis secondary to NASH  Thrombocytopenia, stable CMP mostly unremarkable  except for mild AST elevation to 48 and total bili of 1.7.  INR 1.4,  Obesity, Class III, BMI 40-49.9 (morbid obesity)  Complicating factor to overall prognosis and care  History of CVA (cerebrovascular accident) Continue atorvastatin and Plavix  Type 2 diabetes mellitus without complications  Sliding scale insulin coverage.  Hold home metformin  Essential hypertension Continue home losartan and carvedilol  Chronic back pain Continue tizanidine and Tylenol      Subjective: Patient seen and examined by bedside this morning.  No active complaints.  Had mild improvement in symptoms.  No tenderness around the cellulitic area.  Physical Exam: Vitals:   02/09/23 0844 02/09/23 1630 02/09/23 2323 02/10/23 0751  BP: (!) 133/58 (!) 153/88 (!) 120/55 137/65  Pulse: 60 (!) 58 (!) 55 (!) 56  Resp: 17 16 18 16   Temp: 97.8 F (36.6 C) 97.7 F (36.5 C) (!) 97.5 F (36.4 C) 98.2 F (36.8 C)  TempSrc:      SpO2: 98% 99% 97% 97%  Weight:      Height:       Not in acute distress, normal cephalic asymptomatic head, S1-S2 regular with no grade 3 murmur, equal 80 bilaterally, bilateral edema with erythema and warmth on the left.  Mental state at baseline no neurological deficit, facial symmetry preserved.  Data Reviewed:  There are no new results to review at this time.  Family Communication: No family by bedside  Disposition: Status is: Inpatient Remains inpatient appropriate because: Cellulitis  Planned Discharge Destination: Home    Time spent: 32 minutes  Author: Kirstie Peri, MD 02/10/2023 1:49 PM  For on call review  [email or web address]

## 2023-02-11 DIAGNOSIS — L03116 Cellulitis of left lower limb: Secondary | ICD-10-CM | POA: Diagnosis not present

## 2023-02-11 LAB — CBC
HCT: 35.9 % — ABNORMAL LOW (ref 39.0–52.0)
Hemoglobin: 11.6 g/dL — ABNORMAL LOW (ref 13.0–17.0)
MCH: 31.4 pg (ref 26.0–34.0)
MCHC: 32.3 g/dL (ref 30.0–36.0)
MCV: 97 fL (ref 80.0–100.0)
Platelets: 99 10*3/uL — ABNORMAL LOW (ref 150–400)
RBC: 3.7 MIL/uL — ABNORMAL LOW (ref 4.22–5.81)
RDW: 14.2 % (ref 11.5–15.5)
WBC: 3.5 10*3/uL — ABNORMAL LOW (ref 4.0–10.5)
nRBC: 0 % (ref 0.0–0.2)

## 2023-02-11 LAB — COMPREHENSIVE METABOLIC PANEL
ALT: 31 U/L (ref 0–44)
AST: 46 U/L — ABNORMAL HIGH (ref 15–41)
Albumin: 2.8 g/dL — ABNORMAL LOW (ref 3.5–5.0)
Alkaline Phosphatase: 46 U/L (ref 38–126)
Anion gap: 7 (ref 5–15)
BUN: 31 mg/dL — ABNORMAL HIGH (ref 8–23)
CO2: 22 mmol/L (ref 22–32)
Calcium: 8.6 mg/dL — ABNORMAL LOW (ref 8.9–10.3)
Chloride: 110 mmol/L (ref 98–111)
Creatinine, Ser: 1.09 mg/dL (ref 0.61–1.24)
GFR, Estimated: >60 mL/min (ref >60)
Glucose, Bld: 92 mg/dL (ref 70–99)
Potassium: 4 mmol/L (ref 3.5–5.1)
Sodium: 139 mmol/L (ref 135–145)
Total Bilirubin: 0.7 mg/dL (ref 0.3–1.2)
Total Protein: 5.5 g/dL — ABNORMAL LOW (ref 6.5–8.1)

## 2023-02-11 LAB — GLUCOSE, CAPILLARY
Glucose-Capillary: 94 mg/dL (ref 70–99)
Glucose-Capillary: 95 mg/dL (ref 70–99)

## 2023-02-11 MED ORDER — AMOXICILLIN-POT CLAVULANATE 875-125 MG PO TABS: 1.0000 | ORAL_TABLET | Freq: Two times a day (BID) | ORAL | 0 refills | Status: DC

## 2023-02-11 MED ORDER — DOXYCYCLINE HYCLATE 100 MG PO TABS: 100.0000 mg | ORAL_TABLET | Freq: Two times a day (BID) | ORAL | 0 refills | Status: DC

## 2023-02-11 MED ORDER — AMOXICILLIN-POT CLAVULANATE 875-125 MG PO TABS: 1.0000 | ORAL_TABLET | Freq: Two times a day (BID) | ORAL | Status: DC

## 2023-02-11 MED ORDER — DOXYCYCLINE HYCLATE 100 MG PO TABS: 100.0000 mg | ORAL_TABLET | Freq: Two times a day (BID) | ORAL | Status: DC

## 2023-02-11 NOTE — TOC CM/SW Note (Signed)
Transition of Care [facility]) - Inpatient Brief Assessment   Patient Details  Name: Jordan Chang MRN: [id number] Date of Birth: 12/06/1940  Transition of Care [facility] CM/SW Contact:    Liliana Cline, LCSW Phone Number: 02/11/2023, 9:57 AM   Clinical Narrative:    Transition of Care Asessment: Insurance and Status: Insurance coverage has been reviewed Patient has primary care physician: Yes     Prior/Current Home Services: No current home services Social Determinants of Health Reivew: SDOH reviewed no interventions necessary Readmission risk has been reviewed: Yes Transition of care needs: no transition of care needs at this time

## 2023-02-11 NOTE — Discharge Summary (Signed)
Physician Discharge Summary   Patient: Jordan Chang MRN: [id number] DOB: Sep 04, 1940  Admit date:     02/08/2023  Discharge date: 02/11/23  Discharge Physician: Kirstie Peri   PCP: Barbette Reichmann, MD   Recommendations at discharge:    Follow with PCP in 1 week  Discharge Diagnoses: Principal Problem:   Cellulitis of left leg Active Problems:   Sepsis due to cellulitis left lower extremity(HCC)   Chronic back pain   Essential hypertension   Type 2 diabetes mellitus without complications (HCC)   Thrombocytopenia (HCC)   History of CVA (cerebrovascular accident)   Obesity, Class III, BMI 40-49.9 (morbid obesity) (HCC)   Liver cirrhosis secondary to NASH (HCC)   Chronic venous stasis   Perceptive hearing loss, both sides  Resolved Problems:   * No resolved hospital problems. *  Hospital Course:  82 y.o. male with medical history significant for DM, HTN, prior stroke, morbid obesity, NASH cirrhosis, chronic venous stasis, chronic back pain with prior lumbar laminectomy, and with history of hospitalization in 05/2022  with sepsis secondary to facial cellulitis who presents to the ED with concern for  sepsis after he developed left lower extremity pain, redness, warmth and swelling over the past 24 hours, associated with chills and nausea.  Wife at bedside contributes to history. ED course andData review: Tmax 100.3 with pulse 102 with otherwise normal vitals. Labs: CBC unremarkable except for low platelets of 114 which is his baseline.  CMP mostly unremarkable except for mild AST elevation to 48 and total bili of 1.7.  INR 1.4, urinalysis unremarkable.  Lactic acid 1.8. Lower extremity venous ultrasound negative for DVT Chest x-ray nonacute Patient started on sepsis fluids Rocephin and vancomycin.   8/16 : Patient continues to stay on Rocephin and vancomycin, area of cellulitis demarcated.  If improvement will switch to p.o. antibiotics in the morning with possible discharge in  next 24 to 48 hours.   8/17 : Patient in mild improvement in the skin lesions.  Blood cultures 1 bottle positive for staph species out of 4.  Patient wants to stay on IV antibiotics for another day as he had prolonged course in the past.  Will switch to Augmentin Doxy tomorrow if resolution noticed.  8/18 : Patient had resolution of cellulitic lesion.  No reported tenderness.  Patient's antibiotic was switched to oral doxycycline and Augmentin.  For total of 10 days after discharge.  Advised patient to seek emergent help if condition worsens.  On the day of discharge patient was hemodynamically stable labs within reference range.  Had improvement in presenting symptoms.  All questions and concerns were addressed prior to discharge.  Patient and the wife at bedside agrees with the plan.  Nursing was updated  Assessment and Plan: Sepsis due to cellulitis left lower extremity Continue Rocephin and vancomycin Sepsis fluids Keep leg elevated  Perceptive hearing loss, both sides Increase nursing assistance for communication  Chronic venous stasis Keep legs elevated  Liver cirrhosis secondary to NASH  Thrombocytopenia, stable CMP mostly unremarkable except for mild AST elevation to 48 and total bili of 1.7.  INR 1.4,  Obesity, Class III, BMI 40-49.9 (morbid obesity) Complicating factor to overall prognosis and care  History of CVA (cerebrovascular accident) Continue atorvastatin and Plavix  Type 2 diabetes mellitus without complications  Sliding scale insulin coverage.  Hold home metformin  Essential hypertension Continue home losartan and carvedilol  Chronic back pain Continue tizanidine and Tylenol     Pain control - [facility]  Controlled Substance Reporting System database was reviewed. and patient was instructed, not to drive, operate heavy machinery, perform activities at heights, swimming or participation in water activities or provide baby-sitting services while on Pain,  Sleep and Anxiety Medications; until their outpatient Physician has advised to do so again. Also recommended to not to take more than prescribed Pain, Sleep and Anxiety Medications.  Consultants: None  Procedures performed: None  Disposition: Home Diet recommendation:  Discharge Diet Orders (From admission, onward)     Start     Ordered   02/11/23 0000  Diet - low sodium heart healthy        02/11/23 1239           Cardiac diet DISCHARGE MEDICATION: Allergies as of 02/11/2023       Reactions   Atorvastatin Shortness Of Breath, Other (See Comments)   Dyspnea  Patient has been tolerating 40 mg for many years, per Wife   Other Palpitations, Other (See Comments)   STEROIDS caused tachycardia (exact one not recalled)        Medication List     TAKE these medications    acetaminophen 325 MG tablet Commonly known as: TYLENOL Take 2 tablets (650 mg total) by mouth every 4 (four) hours as needed for mild pain (or temp > 37.5 C (99.5 F)).   amoxicillin-clavulanate 875-125 MG tablet Commonly known as: AUGMENTIN Take 1 tablet by mouth every 12 (twelve) hours.   atorvastatin 40 MG tablet Commonly known as: LIPITOR Take 1 tablet (40 mg total) by mouth every other day. What changed: when to take this   cholecalciferol 25 MCG (1000 UNIT) tablet Commonly known as: VITAMIN D3 Take 1,000 Units by mouth 2 (two) times daily.   clopidogrel 75 MG tablet Commonly known as: PLAVIX Take 1 tablet (75 mg total) by mouth daily.   cyanocobalamin 1000 MCG tablet Commonly known as: VITAMIN B12 Take 1,000 mcg by mouth daily.   doxycycline 100 MG tablet Commonly known as: VIBRA-TABS Take 1 tablet (100 mg total) by mouth every 12 (twelve) hours.   furosemide 20 MG tablet Commonly known as: LASIX Take 20 mg by mouth daily.   losartan 50 MG tablet Commonly known as: COZAAR Take 50 mg by mouth daily.   metFORMIN 500 MG 24 hr tablet Commonly known as: GLUCOPHAGE-XR Take 500 mg by  mouth daily.   tamsulosin 0.4 MG Caps capsule Commonly known as: FLOMAX Take 0.4 mg by mouth daily after supper.        Discharge Exam: Filed Weights   02/08/23 1520  Weight: 126.1 kg   Physical Exam Constitutional:      Appearance: Normal appearance.  HENT:     Head: Normocephalic and atraumatic.     Ears:     Comments: Patient hard of hearing bilaterally    Mouth/Throat:     Mouth: Mucous membranes are dry.  Eyes:     Extraocular Movements: Extraocular movements intact.     Pupils: Pupils are equal, round, and reactive to light.  Cardiovascular:     Rate and Rhythm: Normal rate and regular rhythm.     Pulses: Normal pulses.     Heart sounds: Normal heart sounds.  Pulmonary:     Effort: Pulmonary effort is normal.     Breath sounds: Normal breath sounds.  Abdominal:     Palpations: Abdomen is soft.  Musculoskeletal:        General: Normal range of motion.  Skin:    Comments: Left lower  extremity with mild erythema improving.  No tenderness.  Resolving cellulitis  Neurological:     General: No focal deficit present.     Mental Status: He is alert and oriented to person, place, and time. Mental status is at baseline.  Psychiatric:        Mood and Affect: Mood normal.      Condition at discharge: good  The results of significant diagnostics from this hospitalization (including imaging, microbiology, ancillary and laboratory) are listed below for reference.   Imaging Studies: US Venous Img Lower Unilateral Left  Result Date: 02/08/2023 CLINICAL DATA:  Left lower extremity leg swelling EXAM: LEFT LOWER EXTREMITY VENOUS DOPPLER ULTRASOUND TECHNIQUE: Gray-scale sonography with graded compression, as well as color Doppler and duplex ultrasound were performed to evaluate the lower extremity deep venous systems from the level of the common femoral vein and including the common femoral, femoral, profunda femoral, popliteal and calf veins including the posterior tibial,  peroneal and gastrocnemius veins when visible. The superficial great saphenous vein was also interrogated. Spectral Doppler was utilized to evaluate flow at rest and with distal augmentation maneuvers in the common femoral, femoral and popliteal veins. COMPARISON:  None Available. FINDINGS: Contralateral Common Femoral Vein: Respiratory phasicity is normal and symmetric with the symptomatic side. No evidence of thrombus. Normal compressibility. Common Femoral Vein: No evidence of thrombus. Normal compressibility, respiratory phasicity and response to augmentation. Saphenofemoral Junction: No evidence of thrombus. Normal compressibility and flow on color Doppler imaging. Profunda Femoral Vein: No evidence of thrombus. Normal compressibility and flow on color Doppler imaging. Femoral Vein: No evidence of thrombus. Normal compressibility, respiratory phasicity and response to augmentation. Popliteal Vein: No evidence of thrombus. Normal compressibility, respiratory phasicity and response to augmentation. Calf Veins: No evidence of thrombus. Normal compressibility and flow on color Doppler imaging. Superficial Great Saphenous Vein: No evidence of thrombus. Normal compressibility. Venous Reflux:  None. Other Findings:  None. IMPRESSION: No evidence of deep venous thrombosis. Electronically Signed   By: Alcide Clever M.D.   On: 02/08/2023 20:11   DG Chest 2 View  Result Date: 02/08/2023 CLINICAL DATA:  Leg swelling.  Sepsis EXAM: CHEST - 2 VIEW COMPARISON:  X-ray 02/29/2016 FINDINGS: The heart size and mediastinal contours are within normal limits. Both lungs are clear. No consolidation, pneumothorax or effusion. No edema. The visualized skeletal structures are unremarkable. Calcified aorta. IMPRESSION: No acute cardiopulmonary disease Electronically Signed   By: Karen Kays M.D.   On: 02/08/2023 17:24    Microbiology: Results for orders placed or performed during the hospital encounter of 02/08/23  Culture, blood  (Routine x 2)     Status: Abnormal (Preliminary result)   Collection Time: 02/08/23  6:26 PM   Specimen: BLOOD LEFT ARM  Result Value Ref Range Status   Specimen Description   Final    BLOOD LEFT ARM Performed at [facility], [address] [place], [place] [address]    Special Requests   Final    BOTTLES DRAWN AEROBIC AND ANAEROBIC Blood Culture results may not be optimal due to an excessive volume of blood received in culture bottles Performed at [facility], [address] [place], [place] [address]    Culture  Setup Time   Final    GRAM POSITIVE COCCI IN BOTH AEROBIC AND ANAEROBIC BOTTLES CRITICAL RESULT CALLED TO, READ BACK BY AND VERIFIED WITH: NATHAN BELUE@ 1813 02/09/23 [facility]    Culture STAPHYLOCOCCUS CAPITIS (A)  Final   Report Status PENDING  Incomplete  Culture, blood (Routine x 2)     Status: Abnormal (Preliminary result)   Collection Time: 02/08/23  6:26 PM   Specimen: BLOOD  Result Value Ref Range Status   Specimen Description   Final    BLOOD BLOOD LEFT ARM Performed at [facility], [address] [place], [place] [address]    Special Requests   Final    BOTTLES DRAWN AEROBIC AND ANAEROBIC Blood Culture results may not be optimal due to an excessive volume of blood received in culture bottles Performed at [facility], [address], [place], [place] [address]    Culture  Setup Time   Final    GRAM POSITIVE COCCI STAPHYLOCOCCUS SPECIES IN BOTH AEROBIC AND ANAEROBIC BOTTLES CRITICAL RESULT CALLED TO, READ BACK BY AND VERIFIED WITH: NATHAN BELEU @ 1813 02/09/23 [facility]    Culture (A)  Final    STAPHYLOCOCCUS CAPITIS SUSCEPTIBILITIES TO FOLLOW Performed at [facility], [address], [place], [place] [address]    Report Status PENDING  Incomplete  Blood Culture ID Panel (Reflexed)     Status: Abnormal   Collection Time: 02/08/23  6:26 PM  Result Value Ref Range Status   Enterococcus faecalis NOT DETECTED NOT  DETECTED Final   Enterococcus Faecium NOT DETECTED NOT DETECTED Final   Listeria monocytogenes NOT DETECTED NOT DETECTED Final   Staphylococcus species DETECTED (A) NOT DETECTED Final    Comment: CRITICAL RESULT CALLED TO, READ BACK BY AND VERIFIED WITH: NATHAN BELUE @ 1813 02/09/23 [facility]    Staphylococcus aureus (BCID) NOT DETECTED NOT DETECTED Final   Staphylococcus epidermidis NOT DETECTED NOT DETECTED Final   Staphylococcus lugdunensis NOT DETECTED NOT DETECTED Final   Streptococcus species NOT DETECTED NOT DETECTED Final   Streptococcus agalactiae NOT DETECTED NOT DETECTED Final   Streptococcus pneumoniae NOT DETECTED NOT DETECTED Final   Streptococcus pyogenes NOT DETECTED NOT DETECTED Final   A.calcoaceticus-baumannii NOT DETECTED NOT DETECTED Final   Bacteroides fragilis NOT DETECTED NOT DETECTED Final   Enterobacterales NOT DETECTED NOT DETECTED Final   Enterobacter cloacae complex NOT DETECTED NOT DETECTED Final   Escherichia coli NOT DETECTED NOT DETECTED Final   Klebsiella aerogenes NOT DETECTED NOT DETECTED Final   Klebsiella oxytoca NOT DETECTED NOT DETECTED Final   Klebsiella pneumoniae NOT DETECTED NOT DETECTED Final   Proteus species NOT DETECTED NOT DETECTED Final   Salmonella species NOT DETECTED NOT DETECTED Final   Serratia marcescens NOT DETECTED NOT DETECTED Final   Haemophilus influenzae NOT DETECTED NOT DETECTED Final   Neisseria meningitidis NOT DETECTED NOT DETECTED Final   Pseudomonas aeruginosa NOT DETECTED NOT DETECTED Final   Stenotrophomonas maltophilia NOT DETECTED NOT DETECTED Final   Candida albicans NOT DETECTED NOT DETECTED Final   Candida auris NOT DETECTED NOT DETECTED Final   Candida glabrata NOT DETECTED NOT DETECTED Final   Candida krusei NOT DETECTED NOT DETECTED Final   Candida parapsilosis NOT DETECTED NOT DETECTED Final   Candida tropicalis NOT DETECTED NOT DETECTED Final   Cryptococcus neoformans/gattii NOT DETECTED NOT DETECTED Final     Comment: Performed at [facility], [address] [place], [place] [address]    Labs: CBC: Recent Labs  Lab 02/08/23 1519 02/09/23 0332 02/10/23 0439 02/11/23 0416  WBC 8.4 4.4 3.7* 3.5*  NEUTROABS 6.4  --   --   --   HGB 13.5 11.0* 11.8* 11.6*  HCT 41.3 32.7* 36.8* 35.9*  MCV 96.5 93.7 97.9 97.0  PLT 114* 97*  99* 99*   Basic Metabolic Panel: Recent Labs  Lab 02/08/23 1519 02/09/23 0332 02/10/23 0439 02/11/23 0416  NA 137 140 138 139  K 3.6 3.5 4.0 4.0  CL 105 110 111 110  CO2 23 23 22 22   GLUCOSE 131* 123* 96 92  BUN 26* 26* 27* 31*  CREATININE 1.07 1.09 1.17 1.09  CALCIUM 9.1 8.4* 8.5* 8.6*   Liver Function Tests: Recent Labs  Lab 02/08/23 1519 02/10/23 0439 02/11/23 0416  AST 48* 44* 46*  ALT 36 30 31  ALKPHOS 57 49 46  BILITOT 1.7* 0.7 0.7  PROT 7.1 5.7* 5.5*  ALBUMIN 3.6 2.8* 2.8*   CBG: Recent Labs  Lab 02/10/23 1210 02/10/23 1713 02/10/23 2120 02/11/23 0823 02/11/23 1119  GLUCAP 87 95 89 94 95    Discharge time spent: greater than 30 minutes.  Signed: Kirstie Peri, MD [facility] 02/11/2023

## 2023-02-12 LAB — CULTURE, BLOOD (ROUTINE X 2)

## 2023-03-14 DIAGNOSIS — L600 Ingrowing nail: Secondary | ICD-10-CM | POA: Diagnosis not present

## 2023-03-14 DIAGNOSIS — B351 Tinea unguium: Secondary | ICD-10-CM | POA: Diagnosis not present

## 2023-03-14 DIAGNOSIS — R600 Localized edema: Secondary | ICD-10-CM | POA: Diagnosis not present

## 2023-03-14 DIAGNOSIS — E119 Type 2 diabetes mellitus without complications: Secondary | ICD-10-CM | POA: Diagnosis not present

## 2023-03-14 DIAGNOSIS — M79675 Pain in left toe(s): Secondary | ICD-10-CM | POA: Diagnosis not present

## 2023-03-14 DIAGNOSIS — M79674 Pain in right toe(s): Secondary | ICD-10-CM | POA: Diagnosis not present

## 2023-03-22 ENCOUNTER — Encounter: Payer: Self-pay | Admitting: *Deleted

## 2023-03-22 ENCOUNTER — Emergency Department: Payer: No Typology Code available for payment source

## 2023-03-22 ENCOUNTER — Inpatient Hospital Stay
Admission: EM | Admit: 2023-03-22 | Discharge: 2023-03-26 | DRG: 603 | Disposition: A | Discharge status: Home / Self Care | Payer: No Typology Code available for payment source | Attending: Hospitalist | Admitting: Hospitalist

## 2023-03-22 ENCOUNTER — Other Ambulatory Visit: Payer: Self-pay

## 2023-03-22 DIAGNOSIS — L0390 Cellulitis, unspecified: Secondary | ICD-10-CM | POA: Diagnosis present

## 2023-03-22 DIAGNOSIS — E119 Type 2 diabetes mellitus without complications: Secondary | ICD-10-CM | POA: Diagnosis present

## 2023-03-22 DIAGNOSIS — Z79899 Other long term (current) drug therapy: Secondary | ICD-10-CM

## 2023-03-22 DIAGNOSIS — I878 Other specified disorders of veins: Secondary | ICD-10-CM | POA: Diagnosis present

## 2023-03-22 DIAGNOSIS — Z7984 Long term (current) use of oral hypoglycemic drugs: Secondary | ICD-10-CM

## 2023-03-22 DIAGNOSIS — Z888 Allergy status to other drugs, medicaments and biological substances status: Secondary | ICD-10-CM

## 2023-03-22 DIAGNOSIS — J9811 Atelectasis: Secondary | ICD-10-CM | POA: Diagnosis not present

## 2023-03-22 DIAGNOSIS — A419 Sepsis, unspecified organism: Secondary | ICD-10-CM | POA: Diagnosis not present

## 2023-03-22 DIAGNOSIS — L03116 Cellulitis of left lower limb: Secondary | ICD-10-CM | POA: Diagnosis not present

## 2023-03-22 DIAGNOSIS — K7460 Unspecified cirrhosis of liver: Secondary | ICD-10-CM | POA: Diagnosis present

## 2023-03-22 DIAGNOSIS — I10 Essential (primary) hypertension: Secondary | ICD-10-CM | POA: Diagnosis present

## 2023-03-22 DIAGNOSIS — Z7902 Long term (current) use of antithrombotics/antiplatelets: Secondary | ICD-10-CM

## 2023-03-22 DIAGNOSIS — M7989 Other specified soft tissue disorders: Secondary | ICD-10-CM | POA: Diagnosis not present

## 2023-03-22 DIAGNOSIS — D696 Thrombocytopenia, unspecified: Secondary | ICD-10-CM | POA: Diagnosis present

## 2023-03-22 DIAGNOSIS — Z8673 Personal history of transient ischemic attack (TIA), and cerebral infarction without residual deficits: Secondary | ICD-10-CM

## 2023-03-22 DIAGNOSIS — R0989 Other specified symptoms and signs involving the circulatory and respiratory systems: Secondary | ICD-10-CM | POA: Diagnosis not present

## 2023-03-22 DIAGNOSIS — K7581 Nonalcoholic steatohepatitis (NASH): Secondary | ICD-10-CM | POA: Diagnosis present

## 2023-03-22 DIAGNOSIS — R509 Fever, unspecified: Secondary | ICD-10-CM | POA: Diagnosis not present

## 2023-03-22 DIAGNOSIS — K219 Gastro-esophageal reflux disease without esophagitis: Secondary | ICD-10-CM | POA: Diagnosis present

## 2023-03-22 DIAGNOSIS — E785 Hyperlipidemia, unspecified: Secondary | ICD-10-CM | POA: Diagnosis present

## 2023-03-22 LAB — CBC WITH DIFFERENTIAL/PLATELET
Abs Immature Granulocytes: 0.02 10*3/uL (ref 0.00–0.07)
Basophils Absolute: 0 10*3/uL (ref 0.0–0.1)
Basophils Relative: 1 %
Eosinophils Absolute: 0.1 10*3/uL (ref 0.0–0.5)
Eosinophils Relative: 1 %
HCT: 38.1 % — ABNORMAL LOW (ref 39.0–52.0)
Hemoglobin: 12.5 g/dL — ABNORMAL LOW (ref 13.0–17.0)
Immature Granulocytes: 0 %
Lymphocytes Relative: 17 %
Lymphs Abs: 1 10*3/uL (ref 0.7–4.0)
MCH: 31.5 pg (ref 26.0–34.0)
MCHC: 32.8 g/dL (ref 30.0–36.0)
MCV: 96 fL (ref 80.0–100.0)
Monocytes Absolute: 0.8 10*3/uL (ref 0.1–1.0)
Monocytes Relative: 14 %
Neutro Abs: 3.8 10*3/uL (ref 1.7–7.7)
Neutrophils Relative %: 67 %
Platelets: 106 10*3/uL — ABNORMAL LOW (ref 150–400)
RBC: 3.97 MIL/uL — ABNORMAL LOW (ref 4.22–5.81)
RDW: 14.6 % (ref 11.5–15.5)
WBC: 5.7 10*3/uL (ref 4.0–10.5)
nRBC: 0 % (ref 0.0–0.2)

## 2023-03-22 LAB — COMPREHENSIVE METABOLIC PANEL
ALT: 31 U/L (ref 0–44)
AST: 48 U/L — ABNORMAL HIGH (ref 15–41)
Albumin: 3.3 g/dL — ABNORMAL LOW (ref 3.5–5.0)
Alkaline Phosphatase: 73 U/L (ref 38–126)
Anion gap: 11 (ref 5–15)
BUN: 25 mg/dL — ABNORMAL HIGH (ref 8–23)
CO2: 23 mmol/L (ref 22–32)
Calcium: 9.1 mg/dL (ref 8.9–10.3)
Chloride: 104 mmol/L (ref 98–111)
Creatinine, Ser: 1.06 mg/dL (ref 0.61–1.24)
GFR, Estimated: >60 mL/min (ref >60)
Glucose, Bld: 119 mg/dL — ABNORMAL HIGH (ref 70–99)
Potassium: 3.5 mmol/L (ref 3.5–5.1)
Sodium: 138 mmol/L (ref 135–145)
Total Bilirubin: 1.2 mg/dL (ref 0.3–1.2)
Total Protein: 6.4 g/dL — ABNORMAL LOW (ref 6.5–8.1)

## 2023-03-22 LAB — LACTIC ACID, PLASMA: Lactic Acid, Venous: 1.4 mmol/L (ref 0.5–1.9)

## 2023-03-22 MED ORDER — CLOPIDOGREL BISULFATE 75 MG PO TABS
75.0000 mg | ORAL_TABLET | Freq: Every day | ORAL | Status: DC
  Administered 2023-03-23 – 2023-03-26 (×4): 75 mg via ORAL
  Filled 2023-03-22 (×4): qty 1

## 2023-03-22 MED ORDER — TAMSULOSIN HCL 0.4 MG PO CAPS
0.4000 mg | ORAL_CAPSULE | Freq: Every day | ORAL | Status: DC
  Administered 2023-03-23 – 2023-03-25 (×3): 0.4 mg via ORAL
  Filled 2023-03-22 (×3): qty 1

## 2023-03-22 MED ORDER — ATORVASTATIN CALCIUM 20 MG PO TABS
40.0000 mg | ORAL_TABLET | Freq: Every day | ORAL | Status: DC
  Administered 2023-03-23 – 2023-03-26 (×4): 40 mg via ORAL
  Filled 2023-03-22 (×4): qty 2

## 2023-03-22 MED ORDER — HEPARIN SODIUM (PORCINE) 5000 UNIT/ML IJ SOLN: 5000.0000 [IU] | Freq: Three times a day (TID) | INTRAMUSCULAR | Status: DC

## 2023-03-22 MED ORDER — SODIUM CHLORIDE 0.9 % IV SOLN: 2.0000 g | INTRAVENOUS | Status: DC

## 2023-03-22 MED ORDER — SODIUM CHLORIDE 0.9% FLUSH
3.0000 mL | Freq: Two times a day (BID) | INTRAVENOUS | Status: DC
  Administered 2023-03-23 – 2023-03-26 (×7): 3 mL via INTRAVENOUS

## 2023-03-22 MED ORDER — VANCOMYCIN HCL IN DEXTROSE 1-5 GM/200ML-% IV SOLN: 1000.0000 mg | Freq: Once | INTRAVENOUS | Status: DC

## 2023-03-22 MED ORDER — LACTATED RINGERS IV SOLN
150.0000 mL/h | INTRAVENOUS | Status: DC
  Administered 2023-03-23: 150 mL/h via INTRAVENOUS

## 2023-03-22 MED ORDER — LOSARTAN POTASSIUM 50 MG PO TABS
50.0000 mg | ORAL_TABLET | Freq: Every day | ORAL | Status: DC
  Administered 2023-03-23 – 2023-03-26 (×4): 50 mg via ORAL
  Filled 2023-03-22 (×4): qty 1

## 2023-03-22 MED ORDER — INSULIN ASPART 100 UNIT/ML IJ SOLN: 0.0000 [IU] | Freq: Three times a day (TID) | INTRAMUSCULAR | Status: DC

## 2023-03-22 MED ORDER — VANCOMYCIN HCL 1500 MG/300ML IV SOLN
1500.0000 mg | Freq: Once | INTRAVENOUS | Status: AC
  Administered 2023-03-22: 1500 mg via INTRAVENOUS
  Filled 2023-03-22: qty 300

## 2023-03-22 MED ORDER — ACETAMINOPHEN 325 MG PO TABS: 650.0000 mg | ORAL_TABLET | Freq: Four times a day (QID) | ORAL | Status: DC | PRN

## 2023-03-22 MED ORDER — HEPARIN SODIUM (PORCINE) 5000 UNIT/ML IJ SOLN
5000.0000 [IU] | Freq: Two times a day (BID) | INTRAMUSCULAR | Status: DC
  Administered 2023-03-23 (×2): 5000 [IU] via SUBCUTANEOUS
  Filled 2023-03-22 (×2): qty 1

## 2023-03-22 MED ORDER — SODIUM CHLORIDE 0.9 % IV SOLN
2.0000 g | Freq: Once | INTRAVENOUS | Status: AC
  Administered 2023-03-22: 2 g via INTRAVENOUS
  Filled 2023-03-22: qty 20

## 2023-03-22 MED ORDER — ACETAMINOPHEN 650 MG RE SUPP: 650.0000 mg | Freq: Four times a day (QID) | RECTAL | Status: DC | PRN

## 2023-03-22 MED ORDER — HYDROCHLOROTHIAZIDE 12.5 MG PO TABS
6.2500 mg | ORAL_TABLET | Freq: Every day | ORAL | Status: DC
  Administered 2023-03-23 – 2023-03-26 (×4): 6.25 mg via ORAL
  Filled 2023-03-22 (×5): qty 1

## 2023-03-22 MED ORDER — ONDANSETRON HCL 4 MG/2ML IJ SOLN: 4.0000 mg | Freq: Four times a day (QID) | INTRAMUSCULAR | Status: DC | PRN

## 2023-03-22 MED ORDER — SODIUM CHLORIDE 0.9 % IV SOLN
2.0000 g | INTRAVENOUS | Status: DC
  Administered 2023-03-23 – 2023-03-25 (×3): 2 g via INTRAVENOUS
  Filled 2023-03-22 (×3): qty 20

## 2023-03-22 MED ORDER — VANCOMYCIN HCL IN DEXTROSE 1-5 GM/200ML-% IV SOLN
1000.0000 mg | Freq: Once | INTRAVENOUS | Status: AC
  Administered 2023-03-22: 1000 mg via INTRAVENOUS
  Filled 2023-03-22: qty 200

## 2023-03-22 MED ORDER — ONDANSETRON HCL 4 MG PO TABS: 4.0000 mg | ORAL_TABLET | Freq: Four times a day (QID) | ORAL | Status: DC | PRN

## 2023-03-22 NOTE — Consult Note (Signed)
PHARMACY -  BRIEF ANTIBIOTIC NOTE   Pharmacy has received consult(s) for vancomycin from an ED provider.  The patient's profile has been reviewed for ht/wt/allergies/indication/available labs.    One time order(s) placed for  Vancomycin 2500 mg x 1 ( 1000 mg + 1500 mg)  Further antibiotics/pharmacy consults should be ordered by admitting physician if indicated.                       Thank you, Sharen Hones, PharmD, BCPS Clinical Pharmacist   03/22/2023  9:39 PM

## 2023-03-22 NOTE — H&P (Addendum)
History and Physical    Patient: Jordan Chang ZOX:[id number] DOB: 25-Mar-1941 DOA: 03/22/2023 DOS: the patient was seen and examined on 03/22/2023 PCP: Barbette Reichmann, MD  Patient coming from: Home   Chief Complaint:  Chief Complaint  Patient presents with   Cellulitis    HPI: Jordan Chang is a 82 y.o. male with medical history significant for with history of hypertension, hyperlipidemia,  DM II, h/o cva , facial cellulitis ,GERD, chronic edema, recurrent cellulitis,left leg swelling and redness that began yesterday open low-grade fever and chills. He is here with left leg redness and swelling. The patient states that over the last 24 hours, has had progressive worsening redness and increasing pain of his left leg. Has history of recurrent cellulitis of his  left leg.  Wife is concerned abot why it is recurrent we discussed that weight loss is important and wrapping his legs elevating and skin care with moisturizer to prevent dry cracking skin and hence breakdown or cuts. He has history of NASH cirrhosis as well.    Initial vitals show initial Temp of 100.3 blood pressure 160/67 O2 sats of 94% respiratory rate of 20 and heart rate of 80.  CMP shows glucose 119 normal kidney function elevation in AST ALT total protein 6.4. Lactic of 1.4. Hemoglobin 12.5 normal white count platelet count of 106. In ed pt meets sepsis bundle and received rocephin/ vancomycin.   Review of Systems: Review of Systems  Constitutional:  Positive for chills and fever.  Skin:        Cellulitis.   Past Medical History:  Diagnosis Date   GERD (gastroesophageal reflux disease)    occ   Hyperlipemia    Hypertension    Past Surgical History:  Procedure Laterality Date   BACK SURGERY  05/2011   EYE SURGERY     cataracts   LUMBAR LAMINECTOMY/DECOMPRESSION MICRODISCECTOMY Left 01/20/2016   Procedure: Left Lumbar two-three, Lumbar three-four,  Lumbar four-five Microdiskectomy;  Surgeon: Tressie Stalker,  MD;  Location: [facility];  Service: Neurosurgery;  Laterality: Left;   Social History:   reports that he has never smoked. He has never used smokeless tobacco. He reports that he does not currently use alcohol. He reports that he does not use drugs.  Allergies  Allergen Reactions   Atorvastatin Shortness Of Breath and Other (See Comments)    Dyspnea  Patient has been tolerating 40 mg for many years, per Wife   Other Palpitations and Other (See Comments)    STEROIDS caused tachycardia (exact one not recalled)    Prior to Admission medications   Medication Sig Start Date End Date Taking? Authorizing Provider  acetaminophen (TYLENOL) 325 MG tablet Take 2 tablets (650 mg total) by mouth every 4 (four) hours as needed for mild pain (or temp > 37.5 C (99.5 F)). 11/12/19   Angiulli, Mcarthur Rossetti, PA-C  amoxicillin-clavulanate (AUGMENTIN) 875-125 MG tablet Take 1 tablet by mouth every 12 (twelve) hours. 02/11/23   Kirstie Peri, MD  atorvastatin (LIPITOR) 40 MG tablet Take 1 tablet (40 mg total) by mouth every other day. Patient taking differently: Take 40 mg by mouth daily. 11/12/19 02/09/23  Angiulli, Mcarthur Rossetti, PA-C  cholecalciferol (VITAMIN D3) 25 MCG (1000 UNIT) tablet Take 1,000 Units by mouth 2 (two) times daily.    [provider]  clopidogrel (PLAVIX) 75 MG tablet Take 1 tablet (75 mg total) by mouth daily. 11/12/19   Angiulli, Mcarthur Rossetti, PA-C  cyanocobalamin (VITAMIN B12) 1000 MCG tablet Take  1,000 mcg by mouth daily.    [provider]  doxycycline (VIBRA-TABS) 100 MG tablet Take 1 tablet (100 mg total) by mouth every 12 (twelve) hours. 02/11/23   Kirstie Peri, MD  furosemide (LASIX) 20 MG tablet Take 20 mg by mouth daily.    [provider]  losartan (COZAAR) 50 MG tablet Take 50 mg by mouth daily.    [provider]  metFORMIN (GLUCOPHAGE-XR) 500 MG 24 hr tablet Take 500 mg by mouth daily. 06/07/22   [provider]  tamsulosin (FLOMAX) 0.4 MG  CAPS capsule Take 0.4 mg by mouth daily after supper. 12/25/22   [provider]     Vitals:   03/22/23 1838 03/22/23 2100 03/22/23 2140  BP: (!) 160/67    Pulse: 80    Resp: 20    Temp: 100.3 F (37.9 C)    TempSrc: Oral    SpO2: 94%    Weight:  126.1 kg 129.4 kg   Physical Exam Vitals and nursing note reviewed.  Constitutional:      General: He is not in acute distress. HENT:     Head: Normocephalic and atraumatic.     Right Ear: Hearing normal.     Left Ear: Hearing normal.     Nose: Nose normal. No nasal deformity.     Mouth/Throat:     Lips: Pink.     Tongue: No lesions.     Pharynx: Oropharynx is clear.  Eyes:     General: Lids are normal.     Extraocular Movements: Extraocular movements intact.  Cardiovascular:     Rate and Rhythm: Normal rate and regular rhythm.     Heart sounds: Murmur heard.  Pulmonary:     Effort: Pulmonary effort is normal.     Breath sounds: Normal breath sounds.  Abdominal:     General: Bowel sounds are normal. There is no distension.     Palpations: Abdomen is soft. There is no mass.     Tenderness: There is no abdominal tenderness.  Musculoskeletal:     Right lower leg: Edema present.     Left lower leg: Edema present.  Skin:    General: Skin is warm.     Findings: Erythema present.  Neurological:     General: No focal deficit present.     Mental Status: He is alert and oriented to person, place, and time.     Cranial Nerves: Cranial nerves 2-12 are intact.  Psychiatric:        Attention and Perception: Attention normal.        Mood and Affect: Mood normal.        Speech: Speech normal.        Behavior: Behavior normal. Behavior is cooperative.     Labs on Admission: I have personally reviewed following labs and imaging studies  CBC: Recent Labs  Lab 03/22/23 1844  WBC 5.7  NEUTROABS 3.8  HGB 12.5*  HCT 38.1*  MCV 96.0  PLT 106*   Basic Metabolic Panel: Recent Labs  Lab 03/22/23 1844  NA 138  K 3.5   CL 104  CO2 23  GLUCOSE 119*  BUN 25*  CREATININE 1.06  CALCIUM 9.1   GFR: Estimated Creatinine Clearance: 69.3 mL/min (by C-G formula based on SCr of 1.06 mg/dL). Liver Function Tests: Recent Labs  Lab 03/22/23 1844  AST 48*  ALT 31  ALKPHOS 73  BILITOT 1.2  PROT 6.4*  ALBUMIN 3.3*   No results for input(s): "  LIPASE", "AMYLASE" in the last 168 hours. No results for input(s): "AMMONIA" in the last 168 hours. Coagulation Profile: No results for input(s): "INR", "PROTIME" in the last 168 hours. Cardiac Enzymes: No results for input(s): "CKTOTAL", "CKMB", "CKMBINDEX", "TROPONINI" in the last 168 hours. BNP (last 3 results) No results for input(s): "PROBNP" in the last 8760 hours. HbA1C: No results for input(s): "HGBA1C" in the last 72 hours. CBG: No results for input(s): "GLUCAP" in the last 168 hours. Lipid Profile: No results for input(s): "CHOL", "HDL", "LDLCALC", "TRIG", "CHOLHDL", "LDLDIRECT" in the last 72 hours. Thyroid Function Tests: No results for input(s): "TSH", "T4TOTAL", "FREET4", "T3FREE", "THYROIDAB" in the last 72 hours. Anemia Panel: No results for input(s): "VITAMINB12", "FOLATE", "FERRITIN", "TIBC", "IRON", "RETICCTPCT" in the last 72 hours. Urinalysis    Component Value Date/Time   COLORURINE YELLOW (A) 02/08/2023 1826   APPEARANCEUR CLEAR (A) 02/08/2023 1826   LABSPEC 1.013 02/08/2023 1826   PHURINE 6.0 02/08/2023 1826   GLUCOSEU NEGATIVE 02/08/2023 1826   HGBUR SMALL (A) 02/08/2023 1826   BILIRUBINUR NEGATIVE 02/08/2023 1826   KETONESUR NEGATIVE 02/08/2023 1826   PROTEINUR NEGATIVE 02/08/2023 1826   NITRITE NEGATIVE 02/08/2023 1826   LEUKOCYTESUR NEGATIVE 02/08/2023 1826   Unresulted Labs (From admission, onward)     Start     Ordered   03/22/23 1838  Lactic acid, plasma  Now then every 2 hours,   STAT      03/22/23 1838   03/22/23 1838  Blood culture (routine x 2)  BLOOD CULTURE X 2,   STAT      03/22/23 1838             Medications  vancomycin (VANCOCIN) IVPB 1000 mg/200 mL premix (1,000 mg Intravenous New Bag/Given 03/22/23 2201)    Followed by  vancomycin (VANCOREADY) IVPB 1500 mg/300 mL (has no administration in time range)  cefTRIAXone (ROCEPHIN) 2 g in sodium chloride 0.9 % 100 mL IVPB (0 g Intravenous Stopped 03/22/23 2202)    Radiological Exams on Admission: DG Chest 1 View  Result Date: 03/22/2023 CLINICAL DATA:  Lower leg swelling.  Chills and low-grade fever EXAM: CHEST  1 VIEW COMPARISON:  Radiographs 02/08/2023 FINDINGS: Low lung volumes accentuate cardiomediastinal silhouette and pulmonary vascularity. Aortic atherosclerotic calcification. Bibasilar atelectasis. Otherwise no focal consolidation. No pleural effusion or pneumothorax. IMPRESSION: Low lung volumes and basilar atelectasis.  No acute abnormality. Electronically Signed   By: Minerva Fester M.D.   On: 03/22/2023 21:07     Data Reviewed: Relevant notes from primary care and specialist visits, past discharge summaries as available in EHR, including Care Everywhere. Prior diagnostic testing as pertinent to current admission diagnoses Updated medications and problem lists for reconciliation ED course, including vitals, labs, imaging, treatment and response to treatment Triage notes, nursing and pharmacy notes and ED provider's notes Notable results as noted in HPI  Assessment and Plan: >>Sepsis due to cellulitis left lower extremity Pharmacy consult Rocephin and vancomycin Sepsis fluids.  cefTRIAXone (ROCEPHIN)  IV     lactated ringers     vancomycin 1,500 mg (03/22/23 2340)  Keep leg elevated Wound care.     >>Chronic venous stasis Keep legs elevated. Wrapping d/w patient and wife. Weight loss.    >>Liver cirrhosis secondary to NASH  Thrombocytopenia, stable CMP mostly unremarkable except for mild AST elevation to 48 and total bili of 1.2.  INR 1.4, Heparin q12 for DVT prophylaxis.     >>History of CVA  (cerebrovascular accident) Continue atorvastatin and Plavix   >>  Type 2 diabetes mellitus without complications  Sliding scale insulin coverage.  Hold home metformin   >>Essential hypertension Vitals:   03/22/23 1838 03/22/23 2300 03/22/23 2346  BP: (!) 160/67 (!) 144/66 (!) 142/60  Continue home losartan. Hydrochlorothiazide 6.25 mg added to regimen.     DVT prophylaxis:  Heparin q12h.  Consults:  None.   Advance Care Planning:    Code Status: Prior   Family Communication:  None.    Disposition Plan:  Home.   Severity of Illness: The appropriate patient status for this patient is OBSERVATION. Observation status is judged to be reasonable and necessary in order to provide the required intensity of service to ensure the patient's safety. The patient's presenting symptoms, physical exam findings, and initial radiographic and laboratory data in the context of their medical condition is felt to place them at decreased risk for further clinical deterioration. Furthermore, it is anticipated that the patient will be medically stable for discharge from the hospital within 2 midnights of admission.   Author: Gertha Calkin, MD 03/22/2023 11:07 PM  For on call review [email or web address].

## 2023-03-22 NOTE — ED Provider Notes (Signed)
[facility] Provider Note    Event Date/Time   First MD Initiated Contact with Patient 03/22/23 2050     (approximate)   History   Cellulitis   HPI  Jordan Chang is a 82 y.o. male with history of hypertension, hyperlipidemia, GERD, chronic edema, recurrent cellulitis, here with left leg redness and swelling.  The patient states that over the last 24 hours, has had progressive worsening redness and increasing pain of his left leg.  Has history of recurrent cellulitis of his leg.  He has history of cirrhosis as well.  He has been septic from this leg.  He is also had fevers yesterday, as well as some mild pain yesterday although it is mostly worse today.  The redness has just began today and is spread fairly rapidly.  No recent antibiotic use.  No trauma.  No falls.     Physical Exam   Triage Vital Signs: ED Triage Vitals  Encounter Vitals Group     BP 03/22/23 1838 (!) 160/67     Systolic BP Percentile --      Diastolic BP Percentile --      Pulse Rate 03/22/23 1838 80     Resp 03/22/23 1838 20     Temp 03/22/23 1838 100.3 F (37.9 C)     Temp Source 03/22/23 1838 Oral     SpO2 03/22/23 1838 94 %     Weight --      Height --      Head Circumference --      Peak Flow --      Pain Score 03/22/23 1836 0     Pain Loc --      Pain Education --      Exclude from Growth Chart --     Most recent vital signs: Vitals:   03/23/23 0004 03/23/23 0055  BP: (!) 141/64 (!) 142/58  Pulse: 69 70  Resp:  20  Temp:  97.7 F (36.5 C)  SpO2: 96% 98%     General: Awake, no distress.  CV:  Good peripheral perfusion.  Regular rate and rhythm.  No murmurs. Resp:  Normal work of breathing.  Abd:  No distention.  No tenderness. Other:  Bilateral pitting edema, left greater than right.  There is significant erythema and warmth of the left lower leg, extending circumferentially.  No open wounds.  This extends onto the dorsum of the foot.  Compartments are  soft.  1+ pulses bilaterally.  Normal cap refill.   ED Results / Procedures / Treatments   Labs (all labs ordered are listed, but only abnormal results are displayed) Labs Reviewed  COMPREHENSIVE METABOLIC PANEL - Abnormal; Notable for the following components:      Result Value   Glucose, Bld 119 (*)    BUN 25 (*)    Total Protein 6.4 (*)    Albumin 3.3 (*)    AST 48 (*)    All other components within normal limits  CBC WITH DIFFERENTIAL/PLATELET - Abnormal; Notable for the following components:   RBC 3.97 (*)    Hemoglobin 12.5 (*)    HCT 38.1 (*)    Platelets 106 (*)    All other components within normal limits  CULTURE, BLOOD (ROUTINE X 2)  CULTURE, BLOOD (ROUTINE X 2)  LACTIC ACID, PLASMA  LACTIC ACID, PLASMA  PROTIME-INR  CORTISOL-AM, BLOOD  PROCALCITONIN  HEMOGLOBIN A1C     EKG    RADIOLOGY  Chest x-ray: Low  lung volumes, bibasilar atelectasis  I also independently reviewed and agree with radiologist interpretations.   PROCEDURES:  Critical Care performed: No  Procedures    MEDICATIONS ORDERED IN ED: Medications  vancomycin (VANCOCIN) IVPB 1000 mg/200 mL premix (0 mg Intravenous Stopped 03/22/23 2309)    Followed by  vancomycin (VANCOREADY) IVPB 1500 mg/300 mL (1,500 mg Intravenous New Bag/Given 03/22/23 2340)  clopidogrel (PLAVIX) tablet 75 mg (has no administration in time range)  sodium chloride flush (NS) 0.9 % injection 3 mL (3 mLs Intravenous Not Given 03/23/23 0053)  lactated ringers infusion (150 mL/hr Intravenous New Bag/Given 03/23/23 0104)  acetaminophen (TYLENOL) tablet 650 mg (has no administration in time range)    Or  acetaminophen (TYLENOL) suppository 650 mg (has no administration in time range)  ondansetron (ZOFRAN) tablet 4 mg (has no administration in time range)    Or  ondansetron (ZOFRAN) injection 4 mg (has no administration in time range)  cefTRIAXone (ROCEPHIN) 2 g in sodium chloride 0.9 % 100 mL IVPB (has no administration  in time range)  atorvastatin (LIPITOR) tablet 40 mg (has no administration in time range)  tamsulosin (FLOMAX) capsule 0.4 mg (has no administration in time range)  losartan (COZAAR) tablet 50 mg (has no administration in time range)  insulin aspart (novoLOG) injection 0-15 Units (has no administration in time range)  heparin injection 5,000 Units (5,000 Units Subcutaneous Given 03/23/23 0100)  hydrochlorothiazide (HYDRODIURIL) tablet 6.25 mg (has no administration in time range)  cefTRIAXone (ROCEPHIN) 2 g in sodium chloride 0.9 % 100 mL IVPB (0 g Intravenous Stopped 03/22/23 2202)     IMPRESSION / MDM / ASSESSMENT AND PLAN / ED COURSE  I reviewed the triage vital signs and the nursing notes.                              Differential diagnosis includes, but is not limited to, cellulitis, venous stasis, lymphedema, DVT  Patient's presentation is most consistent with acute presentation with potential threat to life or bodily function.  The patient is on the cardiac monitor to evaluate for evidence of arrhythmia and/or significant heart rate changes  Pleasant 82 yo M here with recurrent left leg pain, redness, and swelling. VSS. Pt has significant cellulitis on exam, temp 100.3, and h/o recurrent cellulitis of same leg. Labs are overall reassuring. No lactic acidosis. No significant leukocytosis. CMP is at baseline. Given the extent of his cellulitis, age, h/o same, and borderline fever, will admit for IV abx and monitoring.    FINAL CLINICAL IMPRESSION(S) / ED DIAGNOSES   Final diagnoses:  Cellulitis of left lower extremity     Rx / DC Orders   ED Discharge Orders     None        Note:  This document was prepared using Dragon voice recognition software and may include unintentional dictation errors.   Shaune Pollack, MD 03/23/23 [phone]

## 2023-03-22 NOTE — ED Triage Notes (Signed)
Pt is here for left lower leg swelling.  Pt reports that his leg began swelling yesterday and his leg is red and he has been having chills and low grade temperature

## 2023-03-23 DIAGNOSIS — K7460 Unspecified cirrhosis of liver: Secondary | ICD-10-CM | POA: Diagnosis not present

## 2023-03-23 DIAGNOSIS — L03116 Cellulitis of left lower limb: Secondary | ICD-10-CM | POA: Diagnosis not present

## 2023-03-23 DIAGNOSIS — E785 Hyperlipidemia, unspecified: Secondary | ICD-10-CM | POA: Diagnosis not present

## 2023-03-23 DIAGNOSIS — K7581 Nonalcoholic steatohepatitis (NASH): Secondary | ICD-10-CM | POA: Diagnosis not present

## 2023-03-23 DIAGNOSIS — I878 Other specified disorders of veins: Secondary | ICD-10-CM | POA: Diagnosis not present

## 2023-03-23 DIAGNOSIS — I10 Essential (primary) hypertension: Secondary | ICD-10-CM | POA: Diagnosis not present

## 2023-03-23 DIAGNOSIS — Z7902 Long term (current) use of antithrombotics/antiplatelets: Secondary | ICD-10-CM | POA: Diagnosis not present

## 2023-03-23 DIAGNOSIS — Z888 Allergy status to other drugs, medicaments and biological substances status: Secondary | ICD-10-CM | POA: Diagnosis not present

## 2023-03-23 DIAGNOSIS — Z79899 Other long term (current) drug therapy: Secondary | ICD-10-CM | POA: Diagnosis not present

## 2023-03-23 DIAGNOSIS — L0390 Cellulitis, unspecified: Secondary | ICD-10-CM | POA: Diagnosis present

## 2023-03-23 DIAGNOSIS — D696 Thrombocytopenia, unspecified: Secondary | ICD-10-CM | POA: Diagnosis not present

## 2023-03-23 DIAGNOSIS — Z7984 Long term (current) use of oral hypoglycemic drugs: Secondary | ICD-10-CM | POA: Diagnosis not present

## 2023-03-23 DIAGNOSIS — Z8673 Personal history of transient ischemic attack (TIA), and cerebral infarction without residual deficits: Secondary | ICD-10-CM | POA: Diagnosis not present

## 2023-03-23 DIAGNOSIS — A419 Sepsis, unspecified organism: Secondary | ICD-10-CM | POA: Diagnosis not present

## 2023-03-23 DIAGNOSIS — E119 Type 2 diabetes mellitus without complications: Secondary | ICD-10-CM | POA: Diagnosis not present

## 2023-03-23 DIAGNOSIS — K219 Gastro-esophageal reflux disease without esophagitis: Secondary | ICD-10-CM | POA: Diagnosis not present

## 2023-03-23 LAB — PROCALCITONIN: Procalcitonin: 0.2 ng/mL

## 2023-03-23 LAB — GLUCOSE, CAPILLARY
Glucose-Capillary: 95 mg/dL (ref 70–99)
Glucose-Capillary: 81 mg/dL (ref 70–99)
Glucose-Capillary: 102 mg/dL — ABNORMAL HIGH (ref 70–99)

## 2023-03-23 LAB — LACTIC ACID, PLASMA: Lactic Acid, Venous: 1.6 mmol/L (ref 0.5–1.9)

## 2023-03-23 LAB — HEMOGLOBIN A1C
Hgb A1c MFr Bld: 5.5 % (ref 4.8–5.6)
Mean Plasma Glucose: 111.15 mg/dL

## 2023-03-23 LAB — TSH: TSH: 2.719 u[IU]/mL (ref 0.350–4.500)

## 2023-03-23 LAB — PROTIME-INR
INR: 1.4 — ABNORMAL HIGH (ref 0.8–1.2)
Prothrombin Time: 17.2 s — ABNORMAL HIGH (ref 11.4–15.2)

## 2023-03-23 LAB — CORTISOL-AM, BLOOD: Cortisol - AM: 5.1 ug/dL — ABNORMAL LOW (ref 6.7–22.6)

## 2023-03-23 MED ORDER — ENOXAPARIN SODIUM 80 MG/0.8ML IJ SOSY
65.0000 mg | PREFILLED_SYRINGE | INTRAMUSCULAR | Status: DC
  Administered 2023-03-23 – 2023-03-25 (×3): 65 mg via SUBCUTANEOUS
  Filled 2023-03-23 (×4): qty 0.65

## 2023-03-23 NOTE — TOC Initial Note (Signed)
Transition of Care [facility]) - Initial/Assessment Note    Patient Details  Name: Jordan Chang MRN: [id number] Date of Birth: 15-Jul-1940  [facility] of [facility]) CM/SW Contact:    Marlowe Sax, RN Phone Number: 03/23/2023, 2:36 PM  Clinical Narrative:                 Met the patient in the room, he lives at home with his wife  He stated that he does well at home, his wife provides transportation He uses a cane usually at home but also has a walker He stated that he feels much better His leg is still swollen and red He stated that he has no needs at home He has 3 steps in the back to get into the house and 4 in the front, he stated he is able to do both without help   Expected Discharge Plan: Home/Self Care Barriers to Discharge: Continued Medical Work up   Patient Goals and CMS Choice  Go home with his wife          Expected Discharge Plan and Services   Discharge Planning Services: CM Consult   Living arrangements for the past 2 months: Single Family Home                   DME Agency: NA       HH Arranged: NA          Prior Living Arrangements/Services Living arrangements for the past 2 months: Single Family Home Lives with:: Self, Spouse Patient language and need for interpreter reviewed:: Yes Do you feel safe going back to the place where you live?: Yes      Need for Family Participation in Patient Care: No (Comment) Care giver support system in place?: Yes (comment) Current home services: DME (Cane, walker) Criminal Activity/Legal Involvement Pertinent to Current Situation/Hospitalization: No - Comment as needed  Activities of Daily Living      Permission Sought/Granted Permission sought to share information with : Case Manager, Family Supports Permission granted to share information with : Yes, Verbal Permission Granted              Emotional Assessment Appearance:: Appears stated age Attitude/Demeanor/Rapport: Ambitious,  Engaged Affect (typically observed): Accepting, Pleasant Orientation: : Oriented to Self, Oriented to Place, Oriented to  Time, Oriented to Situation Alcohol / Substance Use: Not Applicable Psych Involvement: No (comment)  Admission diagnosis:  Cellulitis of left leg [L03.116] Cellulitis [L03.90] Patient Active Problem List   Diagnosis Date Noted   Cellulitis 03/23/2023   Cellulitis of left leg 02/08/2023   Sepsis due to cellulitis left lower extremity(HCC) 02/08/2023   History of CVA (cerebrovascular accident) 02/08/2023   Obesity, Class III, BMI 40-49.9 (morbid obesity) (HCC) 02/08/2023   Liver cirrhosis secondary to NASH (HCC) 02/08/2023   Chronic venous stasis 02/08/2023   Perceptive hearing loss, both sides 02/08/2023   Thrombocytopenia (HCC) 06/25/2022   Dental abscess 06/24/2022   Generalized weakness 06/23/2022   Facial cellulitis 06/23/2022   Dyslipidemia 06/23/2022   Type 2 diabetes mellitus without complications (HCC) 06/23/2022   Hyperlipidemia 10/28/2019   Left pontine cerebrovascular accident (HCC) 10/28/2019   Acute CVA (cerebrovascular accident) (HCC) 10/26/2019   Essential hypertension 10/26/2019   Chronic back pain 01/22/2016   Lumbar herniated disc 01/20/2016   PCP:  Barbette Reichmann, MD Pharmacy:   [facility], [place] - [address] [place] [address] Phone: [phone] Fax: [phone]  [facility] [address], [place] - [address] [place] [address] Phone: [phone] Fax: [phone]     Social Determinants of Health (SDOH) Social History: SDOH Screenings   Food Insecurity: No Food Insecurity (02/10/2023)  Housing: Patient Declined (02/08/2023)  Transportation Needs: No Transportation Needs (02/10/2023)  Utilities: Not At Risk (02/10/2023)  Depression (PHQ2-9): Low Risk  (01/22/2020)  Financial Resource Strain: Low Risk  (01/29/2023)   Received from [facility]   Tobacco Use: Low Risk  (03/22/2023)   SDOH Interventions:     Readmission Risk Interventions     No data to display

## 2023-03-23 NOTE — Care Management Obs Status (Signed)
MEDICARE OBSERVATION STATUS NOTIFICATION   Patient Details  Name: Jordan Chang MRN: [id number] Date of Birth: 1941/04/30   Medicare Observation Status Notification Given:  Yes    Marlowe Sax, RN 03/23/2023, 9:32 AM

## 2023-03-23 NOTE — Plan of Care (Signed)

## 2023-03-23 NOTE — Progress Notes (Signed)
PROGRESS NOTE    Jordan Chang  DGU:[id number] DOB: 1940/06/29 DOA: 03/22/2023 PCP: Barbette Reichmann, MD  160A/160A-AA  LOS: 0 days   Brief hospital course:   Assessment & Plan: Jordan Chang is a 82 y.o. male with medical history significant for with history of DM, HTN, prior stroke, morbid obesity, NASH cirrhosis, chronic venous stasis, chronic back pain with prior lumbar laminectomy presented left leg swelling and redness that began yesterday    LLE cellulitis --started on Vanc and ceftriaxone on presentation --cont ceftriaxone --d/c Vanc  Chronic venous stasis --L>R --rec compression wraps  Chronic thrombocytopenia --plt 106, around baseline  Hx of CVA --cont statin and plavix  DM2 --A1c 5.5, well controlled --resume home metformin after discharge  HTN --cont hydrochlorothiazide and losartan  Sepsis ruled out --did not meet criteria   DVT prophylaxis: Lovenox SQ Code Status: Full code  Family Communication:  Level of care: Med-Surg Dispo:   The patient is from: home Anticipated d/c is to: home Anticipated d/c date is: 1-2 days   Subjective and Interval History:  Pt reported left lower leg redness and swelling much improved today.   Objective: Vitals:   03/23/23 0004 03/23/23 0055 03/23/23 0851 03/23/23 1556  BP: (!) 141/64 (!) 142/58 (!) 155/62 (!) 145/58  Pulse: 69 70 77 64  Resp:  20 14 14   Temp:  97.7 F (36.5 C) 97.7 F (36.5 C) 97.7 F (36.5 C)  TempSrc:      SpO2: 96% 98% 96% 99%  Weight:        Intake/Output Summary (Last 24 hours) at 03/23/2023 1906 Last data filed at 03/23/2023 1700 Gross per 24 hour  Intake 460 ml  Output --  Net 460 ml   Filed Weights   03/22/23 2100 03/22/23 2140  Weight: 126.1 kg 129.4 kg    Examination:   Constitutional: NAD, AAOx3 HEENT: conjunctivae and lids normal, EOMI CV: No cyanosis.   RESP: normal respiratory effort, on RA Extremities: erythema and edema in left lower leg SKIN: warm,  dry Neuro: II - XII grossly intact.   Psych: Normal mood and affect.  Appropriate judgement and reason     Data Reviewed: I have personally reviewed labs and imaging studies  Time spent: 50 minutes  Darlin Priestly, MD [facility] If 7PM-7AM, please contact night-coverage 03/23/2023, 7:06 PM

## 2023-03-24 DIAGNOSIS — L0390 Cellulitis, unspecified: Secondary | ICD-10-CM | POA: Diagnosis not present

## 2023-03-24 DIAGNOSIS — A419 Sepsis, unspecified organism: Secondary | ICD-10-CM | POA: Diagnosis not present

## 2023-03-24 LAB — BASIC METABOLIC PANEL
Anion gap: 7 (ref 5–15)
BUN: 19 mg/dL (ref 8–23)
CO2: 23 mmol/L (ref 22–32)
Calcium: 8.8 mg/dL — ABNORMAL LOW (ref 8.9–10.3)
Chloride: 109 mmol/L (ref 98–111)
Creatinine, Ser: 0.97 mg/dL (ref 0.61–1.24)
GFR, Estimated: >60 mL/min (ref >60)
Glucose, Bld: 101 mg/dL — ABNORMAL HIGH (ref 70–99)
Potassium: 3.8 mmol/L (ref 3.5–5.1)
Sodium: 139 mmol/L (ref 135–145)

## 2023-03-24 LAB — CBC
HCT: 35.4 % — ABNORMAL LOW (ref 39.0–52.0)
Hemoglobin: 12 g/dL — ABNORMAL LOW (ref 13.0–17.0)
MCH: 31.5 pg (ref 26.0–34.0)
MCHC: 33.9 g/dL (ref 30.0–36.0)
MCV: 92.9 fL (ref 80.0–100.0)
Platelets: 117 10*3/uL — ABNORMAL LOW (ref 150–400)
RBC: 3.81 MIL/uL — ABNORMAL LOW (ref 4.22–5.81)
RDW: 14.5 % (ref 11.5–15.5)
WBC: 4.6 10*3/uL (ref 4.0–10.5)
nRBC: 0 % (ref 0.0–0.2)

## 2023-03-24 LAB — MAGNESIUM: Magnesium: 1.9 mg/dL (ref 1.7–2.4)

## 2023-03-24 NOTE — Plan of Care (Signed)

## 2023-03-24 NOTE — Progress Notes (Signed)
PROGRESS NOTE    CHADI MIDYETTE  QMV:[id number] DOB: 09/27/1940 DOA: 03/22/2023 PCP: Barbette Reichmann, MD  160A/160A-AA  LOS: 1 day   Brief hospital course:   Assessment & Plan: Jordan Chang is a 82 y.o. male with medical history significant for with history of DM, HTN, prior stroke, morbid obesity, NASH cirrhosis, chronic venous stasis, chronic back pain with prior lumbar laminectomy presented left leg swelling and redness that began yesterday    LLE cellulitis Hx of recurrent LLE cellulitis --started on Vanc and ceftriaxone on presentation, vanc d/e'ced --cont ceftriaxone  Chronic venous stasis --L>R --applied ACE wrap today to LLE  Chronic thrombocytopenia --plt 106, around baseline  Hx of CVA --cont statin and plavix  DM2 --A1c 5.5, well controlled --resume home metformin after discharge  HTN --cont hydrochlorothiazide and losartan  Sepsis ruled out --did not meet criteria   DVT prophylaxis: Lovenox SQ Code Status: Full code  Family Communication: wife updated at the bedside today Level of care: Med-Surg Dispo:   The patient is from: home Anticipated d/c is to: home Anticipated d/c date is: 1-2 days   Subjective and Interval History:  Left leg still swollen, though improved since presentation, per pt.   Objective: Vitals:   03/23/23 1556 03/23/23 2321 03/24/23 0759 03/24/23 1454  BP: (!) 145/58 (!) 127/44 (!) 144/61 (!) 142/61  Pulse: 64 80 74 65  Resp: 14 18 14 14   Temp: 97.7 F (36.5 C) 98.4 F (36.9 C) 98.3 F (36.8 C) 97.7 F (36.5 C)  TempSrc:  Oral    SpO2: 99% 97% 95% 100%  Weight:        Intake/Output Summary (Last 24 hours) at 03/24/2023 1655 Last data filed at 03/24/2023 0943 Gross per 24 hour  Intake 200 ml  Output --  Net 200 ml   Filed Weights   03/22/23 2100 03/22/23 2140  Weight: 126.1 kg 129.4 kg    Examination:   Constitutional: NAD, AAOx3 HEENT: conjunctivae and lids normal, EOMI CV: No cyanosis.   RESP:  normal respiratory effort, on RA Extremities: erythema and edema over left lower leg and left foot. SKIN: warm, dry Neuro: II - XII grossly intact.   Psych: Normal mood and affect.  Appropriate judgement and reason   Data Reviewed: I have personally reviewed labs and imaging studies  Time spent: 35 minutes  Darlin Priestly, MD [facility] If 7PM-7AM, please contact night-coverage 03/24/2023, 4:55 PM

## 2023-03-25 DIAGNOSIS — A419 Sepsis, unspecified organism: Secondary | ICD-10-CM | POA: Diagnosis not present

## 2023-03-25 DIAGNOSIS — L0390 Cellulitis, unspecified: Secondary | ICD-10-CM | POA: Diagnosis not present

## 2023-03-25 NOTE — Progress Notes (Signed)
PROGRESS NOTE    Jordan Chang  [id number]:[id number] DOB: 1940-09-19 DOA: 03/22/2023 PCP: Barbette Reichmann, MD  160A/160A-AA  LOS: 2 days   Brief hospital course:   Assessment & Plan: Jordan Chang is a 82 y.o. male with medical history significant for with history of DM, HTN, prior stroke, morbid obesity, NASH cirrhosis, chronic venous stasis, chronic back pain with prior lumbar laminectomy presented left leg swelling and redness that began yesterday    LLE cellulitis Hx of recurrent LLE cellulitis --started on Vanc and ceftriaxone on presentation, vanc d/e'ced --cont ceftriaxone day 4  Chronic venous stasis --L>R --reapply ACE wrap to LLE today  Chronic thrombocytopenia --plt 106, around baseline  Hx of CVA --cont statin and plavix  DM2 --A1c 5.5, well controlled --no need BG checks --resume home metformin after discharge  HTN --cont hydrochlorothiazide and losartan  Sepsis ruled out --did not meet criteria   DVT prophylaxis: Lovenox SQ Code Status: Full code  Family Communication: wife updated at the bedside today Level of care: Med-Surg Dispo:   The patient is from: home Anticipated d/c is to: home Anticipated d/c date is: tomorrow   Subjective and Interval History:  Both erythema and edema improved in LLE.   Objective: Vitals:   03/24/23 1454 03/24/23 2314 03/25/23 0736 03/25/23 1623  BP: (!) 142/61 (!) 133/54 (!) 137/50 (!) 136/54  Pulse: 65 75 66 64  Resp: 14 20 17 17   Temp: 97.7 F (36.5 C) 97.8 F (36.6 C) 97.8 F (36.6 C) 97.9 F (36.6 C)  TempSrc:      SpO2: 100% 99% 97% 98%  Weight:        Intake/Output Summary (Last 24 hours) at 03/25/2023 1649 Last data filed at 03/25/2023 1401 Gross per 24 hour  Intake 920 ml  Output 1 ml  Net 919 ml   Filed Weights   03/22/23 2100 03/22/23 2140  Weight: 126.1 kg 129.4 kg    Examination:   Constitutional: NAD, AAOx3 HEENT: conjunctivae and lids normal, EOMI CV: No cyanosis.   RESP:  normal respiratory effort, on RA Extremities: LLE and left foot swollen, but improved from prior SKIN: warm, dry Neuro: II - XII grossly intact.   Psych: Normal mood and affect.  Appropriate judgement and reason   Data Reviewed: I have personally reviewed labs and imaging studies  Time spent: 35 minutes  Darlin Priestly, MD [facility] If 7PM-7AM, please contact night-coverage 03/25/2023, 4:49 PM

## 2023-03-25 NOTE — Plan of Care (Signed)
Patient is alert and oriented x4 for this RN. Patient up in chair for most of the morning and afternoon. Wife is at bedside. Dressing rewrapped with ace bandage and education provided regarding care for edema in bilateral lower extremities. Right leg edema presents as 4+ from shin to foot. Left leg edema improving 2+ on lower leg and 3+ on left foot. No complaints of pain or discomfort at this time. Will continue to monitor and implement all md orders.   Problem: Fluid Volume: Goal: Hemodynamic stability will improve Outcome: Progressing   Problem: Clinical Measurements: Goal: Diagnostic test results will improve Outcome: Progressing Goal: Signs and symptoms of infection will decrease Outcome: Progressing   Problem: Respiratory: Goal: Ability to maintain adequate ventilation will improve Outcome: Progressing   Problem: Education: Goal: Ability to describe self-care measures that may prevent or decrease complications (Diabetes Survival Skills Education) will improve Outcome: Progressing Goal: Individualized Educational Video(s) Outcome: Progressing   Problem: Coping: Goal: Ability to adjust to condition or change in health will improve Outcome: Progressing   Problem: Fluid Volume: Goal: Ability to maintain a balanced intake and output will improve Outcome: Progressing   Problem: Health Behavior/Discharge Planning: Goal: Ability to identify and utilize available resources and services will improve Outcome: Progressing Goal: Ability to manage health-related needs will improve Outcome: Progressing   Problem: Metabolic: Goal: Ability to maintain appropriate glucose levels will improve Outcome: Progressing   Problem: Nutritional: Goal: Maintenance of adequate nutrition will improve Outcome: Progressing Goal: Progress toward achieving an optimal weight will improve Outcome: Progressing   Problem: Skin Integrity: Goal: Risk for impaired skin integrity will decrease Outcome:  Progressing   Problem: Tissue Perfusion: Goal: Adequacy of tissue perfusion will improve Outcome: Progressing   Problem: Education: Goal: Knowledge of General Education information will improve Description: Including pain rating scale, medication(s)/side effects and non-pharmacologic comfort measures Outcome: Progressing   Problem: Health Behavior/Discharge Planning: Goal: Ability to manage health-related needs will improve Outcome: Progressing   Problem: Clinical Measurements: Goal: Ability to maintain clinical measurements within normal limits will improve Outcome: Progressing Goal: Will remain free from infection Outcome: Progressing Goal: Diagnostic test results will improve Outcome: Progressing Goal: Respiratory complications will improve Outcome: Progressing Goal: Cardiovascular complication will be avoided Outcome: Progressing   Problem: Activity: Goal: Risk for activity intolerance will decrease Outcome: Progressing   Problem: Nutrition: Goal: Adequate nutrition will be maintained Outcome: Progressing   Problem: Coping: Goal: Level of anxiety will decrease Outcome: Progressing   Problem: Elimination: Goal: Will not experience complications related to bowel motility Outcome: Progressing Goal: Will not experience complications related to urinary retention Outcome: Progressing   Problem: Pain Managment: Goal: General experience of comfort will improve Outcome: Progressing   Problem: Safety: Goal: Ability to remain free from injury will improve Outcome: Progressing   Problem: Skin Integrity: Goal: Risk for impaired skin integrity will decrease Outcome: Progressing

## 2023-03-26 ENCOUNTER — Other Ambulatory Visit (HOSPITAL_COMMUNITY): Payer: Self-pay

## 2023-03-26 DIAGNOSIS — A419 Sepsis, unspecified organism: Secondary | ICD-10-CM | POA: Diagnosis not present

## 2023-03-26 DIAGNOSIS — L0390 Cellulitis, unspecified: Secondary | ICD-10-CM | POA: Diagnosis not present

## 2023-03-26 LAB — CBC
HCT: 32.4 % — ABNORMAL LOW (ref 39.0–52.0)
Hemoglobin: 10.9 g/dL — ABNORMAL LOW (ref 13.0–17.0)
MCH: 31.1 pg (ref 26.0–34.0)
MCHC: 33.6 g/dL (ref 30.0–36.0)
MCV: 92.6 fL (ref 80.0–100.0)
Platelets: 119 10*3/uL — ABNORMAL LOW (ref 150–400)
RBC: 3.5 MIL/uL — ABNORMAL LOW (ref 4.22–5.81)
RDW: 14.2 % (ref 11.5–15.5)
WBC: 3.5 10*3/uL — ABNORMAL LOW (ref 4.0–10.5)
nRBC: 0 % (ref 0.0–0.2)

## 2023-03-26 LAB — BASIC METABOLIC PANEL
Anion gap: 5 (ref 5–15)
BUN: 19 mg/dL (ref 8–23)
CO2: 24 mmol/L (ref 22–32)
Calcium: 8.6 mg/dL — ABNORMAL LOW (ref 8.9–10.3)
Chloride: 109 mmol/L (ref 98–111)
Creatinine, Ser: 0.92 mg/dL (ref 0.61–1.24)
GFR, Estimated: >60 mL/min (ref >60)
Glucose, Bld: 88 mg/dL (ref 70–99)
Potassium: 3.7 mmol/L (ref 3.5–5.1)
Sodium: 138 mmol/L (ref 135–145)

## 2023-03-26 LAB — MAGNESIUM: Magnesium: 1.9 mg/dL (ref 1.7–2.4)

## 2023-03-26 MED ORDER — SODIUM CHLORIDE 0.9 % IV SOLN: 2.0000 g | Freq: Once | INTRAVENOUS | Status: DC

## 2023-03-26 MED ORDER — CEFADROXIL 500 MG PO CAPS: 500.0000 mg | ORAL_CAPSULE | Freq: Two times a day (BID) | ORAL | 0 refills | Status: AC

## 2023-03-26 MED ORDER — SODIUM CHLORIDE 0.9 % IV SOLN
2.0000 g | Freq: Once | INTRAVENOUS | Status: AC
  Administered 2023-03-26: 2 g via INTRAVENOUS
  Filled 2023-03-26: qty 20

## 2023-03-26 NOTE — Plan of Care (Signed)
  Problem: Fluid Volume: Goal: Hemodynamic stability will improve Outcome: Adequate for Discharge   Problem: Clinical Measurements: Goal: Diagnostic test results will improve Outcome: Adequate for Discharge Goal: Signs and symptoms of infection will decrease Outcome: Adequate for Discharge   Problem: Respiratory: Goal: Ability to maintain adequate ventilation will improve Outcome: Adequate for Discharge   Problem: Education: Goal: Ability to describe self-care measures that may prevent or decrease complications (Diabetes Survival Skills Education) will improve Outcome: Adequate for Discharge Goal: Individualized Educational Video(s) Outcome: Adequate for Discharge   Problem: Coping: Goal: Ability to adjust to condition or change in health will improve Outcome: Adequate for Discharge   Problem: Fluid Volume: Goal: Ability to maintain a balanced intake and output will improve Outcome: Adequate for Discharge   Problem: Health Behavior/Discharge Planning: Goal: Ability to identify and utilize available resources and services will improve Outcome: Adequate for Discharge Goal: Ability to manage health-related needs will improve Outcome: Adequate for Discharge   Problem: Metabolic: Goal: Ability to maintain appropriate glucose levels will improve Outcome: Adequate for Discharge   Problem: Nutritional: Goal: Maintenance of adequate nutrition will improve Outcome: Adequate for Discharge Goal: Progress toward achieving an optimal weight will improve Outcome: Adequate for Discharge   Problem: Skin Integrity: Goal: Risk for impaired skin integrity will decrease Outcome: Adequate for Discharge   Problem: Tissue Perfusion: Goal: Adequacy of tissue perfusion will improve Outcome: Adequate for Discharge   Problem: Education: Goal: Knowledge of General Education information will improve Description: Including pain rating scale, medication(s)/side effects and non-pharmacologic  comfort measures Outcome: Adequate for Discharge   Problem: Health Behavior/Discharge Planning: Goal: Ability to manage health-related needs will improve Outcome: Adequate for Discharge   Problem: Clinical Measurements: Goal: Ability to maintain clinical measurements within normal limits will improve Outcome: Adequate for Discharge Goal: Will remain free from infection Outcome: Adequate for Discharge Goal: Diagnostic test results will improve Outcome: Adequate for Discharge Goal: Respiratory complications will improve Outcome: Adequate for Discharge Goal: Cardiovascular complication will be avoided Outcome: Adequate for Discharge   Problem: Activity: Goal: Risk for activity intolerance will decrease Outcome: Adequate for Discharge   Problem: Nutrition: Goal: Adequate nutrition will be maintained Outcome: Adequate for Discharge   Problem: Coping: Goal: Level of anxiety will decrease Outcome: Adequate for Discharge   Problem: Elimination: Goal: Will not experience complications related to bowel motility Outcome: Adequate for Discharge Goal: Will not experience complications related to urinary retention Outcome: Adequate for Discharge   Problem: Pain Managment: Goal: General experience of comfort will improve Outcome: Adequate for Discharge   Problem: Safety: Goal: Ability to remain free from injury will improve Outcome: Adequate for Discharge   Problem: Skin Integrity: Goal: Risk for impaired skin integrity will decrease Outcome: Adequate for Discharge   

## 2023-03-26 NOTE — TOC Transition Note (Signed)
Transition of Care [facility]) - CM/SW Discharge Note   Patient Details  Name: Jordan Chang MRN: [id number] Date of Birth: [birth year]  [facility]) CM/SW Contact:  Marlowe Sax, RN Phone Number: 03/26/2023, 9:49 AM   Clinical Narrative:    The patient comes from home with his wife, plans to DC today home with his wife providing transportation He stated that he has no needs   Final next level of care: Home/Self Care Barriers to Discharge: Continued Medical Work up   Patient Goals and CMS Choice      Discharge Placement                         Discharge Plan and Services Additional resources added to the After Visit Summary for     Discharge Planning Services: CM Consult              DME Agency: NA       HH Arranged: NA          Social Determinants of Health (SDOH) Interventions SDOH Screenings   Food Insecurity: No Food Insecurity (02/10/2023)  Housing: Patient Declined (02/08/2023)  Transportation Needs: No Transportation Needs (02/10/2023)  Utilities: Not At Risk (02/10/2023)  Depression (PHQ2-9): Low Risk  (01/22/2020)  Financial Resource Strain: Low Risk  (01/29/2023)   Received from [facility]  Tobacco Use: Low Risk  (03/22/2023)     Readmission Risk Interventions     No data to display

## 2023-03-26 NOTE — TOC Benefit Eligibility Note (Addendum)
Patient Product/process development scientist completed.    The patient is insured through [employer]/ [employer]. Patient has Medicare and is not eligible for a copay card, but may be able to apply for patient assistance, if available.    Ran test claim for cefadroxil (Duricef) 500 mg and the current 5 day co-pay is $3.12.   This test claim was processed through [facility]- copay amounts may vary at other pharmacies due to pharmacy/plan contracts, or as the patient moves through the different stages of their insurance plan.     Roland Earl, CPHT Pharmacy Technician III Certified Patient Advocate [facility] [place] [facility] Patient Advocate Team Direct Number: [phone]  Fax: [phone]

## 2023-03-26 NOTE — Discharge Summary (Signed)
Physician Discharge Summary   Jordan Chang  male DOB: [birth year]  VWU:[id number]  PCP: Barbette Reichmann, MD  Admit date: 03/22/2023 Discharge date: 03/26/2023  Admitted From: home Disposition:  home CODE STATUS: Full code  Discharge Instructions     Diet - low sodium heart healthy   Complete by: As directed    Discharge instructions   Complete by: As directed    You have received 5 days of IV antibiotic for your left leg cellulitis.  Please finish 5 more days of oral antibiotic with cefadroxil as directed.  Use ACE wrap to reduce your leg swelling, as shown to you. - -   No wound care   Complete by: As directed       Hospital Course:  For full details, please see H&P, progress notes, consult notes and ancillary notes.  Briefly,  Jordan Chang is a 82 y.o. male with medical history significant for DM, HTN, prior stroke, morbid obesity, NASH cirrhosis, chronic venous stasis, chronic back pain with prior lumbar laminectomy presented left leg swelling and redness that began the day prior to presentation.    LLE cellulitis Hx of recurrent LLE cellulitis --started on Vanc and ceftriaxone on presentation, vanc d/e'ced --LLE erythema and edema improved prior to discharge.  Pt received 5 days of ceftriaxone and was discharged on 5 more days of cefadroxil to finish a 10-day course due to recurrent nature of the cellulitis.   Chronic venous stasis --L>R --applied ACE wrap to LLE and re-wrap daily.  Demonstrated to pt and wife how to wrap from above the toes to beneath the knee to apply compression to control edema.   Chronic thrombocytopenia --plt 106, around baseline   Hx of CVA --cont statin and plavix   DM2 --A1c 5.5, well controlled --no need for BG checks --resume home metformin after discharge   HTN --hydrochlorothiazide was not on home med list. --cont home lasix and losartan   Sepsis ruled out --did not meet criteria   Discharge Diagnoses:   Principal Problem:   Sepsis due to cellulitis left lower extremity(HCC) Active Problems:   Essential hypertension   Type 2 diabetes mellitus without complications (HCC)   Thrombocytopenia (HCC)   Cellulitis of left leg   Liver cirrhosis secondary to NASH (HCC)   Cellulitis   30 Day Unplanned Readmission Risk Score    Flowsheet Row ED to Hosp-Admission (Current) from 03/22/2023 in [facility] (1A)  30 Day Unplanned Readmission Risk Score (%) 14.11 Filed at 03/26/2023 0801       This score is the patient's risk of an unplanned readmission within 30 days of being discharged (0 -100%). The score is based on dignosis, age, lab data, medications, orders, and past utilization.   Low:  0-14.9   Medium: 15-21.9   High: 22-29.9   Extreme: 30 and above         Discharge Instructions:  Allergies as of 03/26/2023       Reactions   Atorvastatin Shortness Of Breath, Other (See Comments)   Dyspnea  Patient has been tolerating 40 mg for many years, per Wife   Other Palpitations, Other (See Comments)   STEROIDS caused tachycardia (exact one not recalled)        Medication List     STOP taking these medications    amoxicillin-clavulanate 875-125 MG tablet Commonly known as: AUGMENTIN   doxycycline 100 MG tablet Commonly known as: VIBRA-TABS       TAKE these medications  acetaminophen 325 MG tablet Commonly known as: TYLENOL Take 2 tablets (650 mg total) by mouth every 4 (four) hours as needed for mild pain (or temp > 37.5 C (99.5 F)).   atorvastatin 40 MG tablet Commonly known as: LIPITOR Take 1 tablet (40 mg total) by mouth every other day. What changed: when to take this   cefadroxil 500 MG capsule Commonly known as: DURICEF Take 1 capsule (500 mg total) by mouth 2 (two) times daily for 5 days. Start taking on: March 27, 2023   cholecalciferol 25 MCG (1000 UNIT) tablet Commonly known as: VITAMIN D3 Take 1,000 Units by mouth 2  (two) times daily.   clopidogrel 75 MG tablet Commonly known as: PLAVIX Take 1 tablet (75 mg total) by mouth daily.   cyanocobalamin 1000 MCG tablet Commonly known as: VITAMIN B12 Take 1,000 mcg by mouth daily.   furosemide 20 MG tablet Commonly known as: LASIX Take 20 mg by mouth daily.   loratadine 10 MG tablet Commonly known as: CLARITIN Take 10 mg by mouth daily.   losartan 50 MG tablet Commonly known as: COZAAR Take 50 mg by mouth daily.   metFORMIN 500 MG 24 hr tablet Commonly known as: GLUCOPHAGE-XR Take 500 mg by mouth daily with supper.   tamsulosin 0.4 MG Caps capsule Commonly known as: FLOMAX Take 0.4 mg by mouth daily after supper.         Follow-up Information     Barbette Reichmann, MD Follow up in 1 week(s).   Specialty: Internal Medicine Contact information: [address] [place] [address] [phone]                 Allergies  Allergen Reactions   Atorvastatin Shortness Of Breath and Other (See Comments)    Dyspnea  Patient has been tolerating 40 mg for many years, per Wife   Other Palpitations and Other (See Comments)    STEROIDS caused tachycardia (exact one not recalled)     The results of significant diagnostics from this hospitalization (including imaging, microbiology, ancillary and laboratory) are listed below for reference.   Consultations:   Procedures/Studies: DG Chest 1 View  Result Date: 03/22/2023 CLINICAL DATA:  Lower leg swelling.  Chills and low-grade fever EXAM: CHEST  1 VIEW COMPARISON:  Radiographs 02/08/2023 FINDINGS: Low lung volumes accentuate cardiomediastinal silhouette and pulmonary vascularity. Aortic atherosclerotic calcification. Bibasilar atelectasis. Otherwise no focal consolidation. No pleural effusion or pneumothorax. IMPRESSION: Low lung volumes and basilar atelectasis.  No acute abnormality. Electronically Signed   By: Minerva Fester M.D.   On: 03/22/2023 21:07       Labs: BNP (last 3 results) No results for input(s): "BNP" in the last 8760 hours. Basic Metabolic Panel: Recent Labs  Lab 03/22/23 1844 03/24/23 0538 03/26/23 0236  NA 138 139 138  K 3.5 3.8 3.7  CL 104 109 109  CO2 23 23 24   GLUCOSE 119* 101* 88  BUN 25* 19 19  CREATININE 1.06 0.97 0.92  CALCIUM 9.1 8.8* 8.6*  MG  --  1.9 1.9   Liver Function Tests: Recent Labs  Lab 03/22/23 1844  AST 48*  ALT 31  ALKPHOS 73  BILITOT 1.2  PROT 6.4*  ALBUMIN 3.3*   No results for input(s): "LIPASE", "AMYLASE" in the last 168 hours. No results for input(s): "AMMONIA" in the last 168 hours. CBC: Recent Labs  Lab 03/22/23 1844 03/24/23 0538 03/26/23 0236  WBC 5.7 4.6 3.5*  NEUTROABS 3.8  --   --  HGB 12.5* 12.0* 10.9*  HCT 38.1* 35.4* 32.4*  MCV 96.0 92.9 92.6  PLT 106* 117* 119*   Cardiac Enzymes: No results for input(s): "CKTOTAL", "CKMB", "CKMBINDEX", "TROPONINI" in the last 168 hours. BNP: Invalid input(s): "POCBNP" CBG: Recent Labs  Lab 03/23/23 0810 03/23/23 1201 03/23/23 1701  GLUCAP 95 81 102*   D-Dimer No results for input(s): "DDIMER" in the last 72 hours. Hgb A1c No results for input(s): "HGBA1C" in the last 72 hours. Lipid Profile No results for input(s): "CHOL", "HDL", "LDLCALC", "TRIG", "CHOLHDL", "LDLDIRECT" in the last 72 hours. Thyroid function studies No results for input(s): "TSH", "T4TOTAL", "T3FREE", "THYROIDAB" in the last 72 hours.  Invalid input(s): "FREET3" Anemia work up No results for input(s): "VITAMINB12", "FOLATE", "FERRITIN", "TIBC", "IRON", "RETICCTPCT" in the last 72 hours. Urinalysis    Component Value Date/Time   COLORURINE YELLOW (A) 02/08/2023 1826   APPEARANCEUR CLEAR (A) 02/08/2023 1826   LABSPEC 1.013 02/08/2023 1826   PHURINE 6.0 02/08/2023 1826   GLUCOSEU NEGATIVE 02/08/2023 1826   HGBUR SMALL (A) 02/08/2023 1826   BILIRUBINUR NEGATIVE 02/08/2023 1826   KETONESUR NEGATIVE 02/08/2023 1826   PROTEINUR  NEGATIVE 02/08/2023 1826   NITRITE NEGATIVE 02/08/2023 1826   LEUKOCYTESUR NEGATIVE 02/08/2023 1826   Sepsis Labs Recent Labs  Lab 03/22/23 1844 03/24/23 0538 03/26/23 0236  WBC 5.7 4.6 3.5*   Microbiology Recent Results (from the past 240 hour(s))  Blood culture (routine x 2)     Status: None (Preliminary result)   Collection Time: 03/22/23  6:43 PM   Specimen: BLOOD  Result Value Ref Range Status   Specimen Description BLOOD RIGHT ANTECUBITAL  Final   Special Requests   Final    BOTTLES DRAWN AEROBIC AND ANAEROBIC Blood Culture adequate volume   Culture   Final    NO GROWTH 4 DAYS Performed at [facility], [address] [place], [place] [address]    Report Status PENDING  Incomplete  Blood culture (routine x 2)     Status: None (Preliminary result)   Collection Time: 03/23/23  1:13 AM   Specimen: BLOOD  Result Value Ref Range Status   Specimen Description BLOOD BLOOD RIGHT ARM  Final   Special Requests   Final    BOTTLES DRAWN AEROBIC AND ANAEROBIC Blood Culture adequate volume   Culture   Final    NO GROWTH 3 DAYS Performed at [facility], [address] [place], [place] [address]    Report Status PENDING  Incomplete     Total time spend on discharging this patient, including the last patient exam, discussing the hospital stay, instructions for ongoing care as it relates to all pertinent caregivers, as well as preparing the medical discharge records, prescriptions, and/or referrals as applicable, is 35 minutes.    Darlin Priestly, MD  [facility] 03/26/2023, 9:48 AM

## 2023-03-26 NOTE — Plan of Care (Signed)
  Problem: Fluid Volume: Goal: Hemodynamic stability will improve Outcome: Progressing   Problem: Clinical Measurements: Goal: Signs and symptoms of infection will decrease Outcome: Progressing   Problem: Coping: Goal: Ability to adjust to condition or change in health will improve Outcome: Progressing   Problem: Fluid Volume: Goal: Ability to maintain a balanced intake and output will improve Outcome: Progressing

## 2023-03-27 LAB — CULTURE, BLOOD (ROUTINE X 2)
Culture: NO GROWTH
Special Requests: ADEQUATE

## 2023-03-28 LAB — CULTURE, BLOOD (ROUTINE X 2)
Culture: NO GROWTH
Special Requests: ADEQUATE

## 2023-06-15 DIAGNOSIS — Z125 Encounter for screening for malignant neoplasm of prostate: Secondary | ICD-10-CM | POA: Diagnosis not present

## 2023-06-15 DIAGNOSIS — R7989 Other specified abnormal findings of blood chemistry: Secondary | ICD-10-CM | POA: Diagnosis not present

## 2023-06-15 DIAGNOSIS — E1165 Type 2 diabetes mellitus with hyperglycemia: Secondary | ICD-10-CM | POA: Diagnosis not present

## 2023-06-15 DIAGNOSIS — I10 Essential (primary) hypertension: Secondary | ICD-10-CM | POA: Diagnosis not present

## 2023-06-15 DIAGNOSIS — E7800 Pure hypercholesterolemia, unspecified: Secondary | ICD-10-CM | POA: Diagnosis not present

## 2023-06-25 DIAGNOSIS — R790 Abnormal level of blood mineral: Secondary | ICD-10-CM | POA: Diagnosis not present

## 2023-06-25 DIAGNOSIS — Z1331 Encounter for screening for depression: Secondary | ICD-10-CM | POA: Diagnosis not present

## 2023-06-25 DIAGNOSIS — D61818 Other pancytopenia: Secondary | ICD-10-CM | POA: Diagnosis not present

## 2023-06-25 DIAGNOSIS — D649 Anemia, unspecified: Secondary | ICD-10-CM | POA: Diagnosis not present

## 2023-06-25 DIAGNOSIS — H9193 Unspecified hearing loss, bilateral: Secondary | ICD-10-CM | POA: Diagnosis not present

## 2023-06-25 DIAGNOSIS — Z8673 Personal history of transient ischemic attack (TIA), and cerebral infarction without residual deficits: Secondary | ICD-10-CM | POA: Diagnosis not present

## 2023-06-25 DIAGNOSIS — R7989 Other specified abnormal findings of blood chemistry: Secondary | ICD-10-CM | POA: Diagnosis not present

## 2023-06-25 DIAGNOSIS — E1165 Type 2 diabetes mellitus with hyperglycemia: Secondary | ICD-10-CM | POA: Diagnosis not present

## 2023-06-25 DIAGNOSIS — I10 Essential (primary) hypertension: Secondary | ICD-10-CM | POA: Diagnosis not present

## 2023-06-25 DIAGNOSIS — Z0000 Encounter for general adult medical examination without abnormal findings: Secondary | ICD-10-CM | POA: Diagnosis not present

## 2023-06-25 DIAGNOSIS — J209 Acute bronchitis, unspecified: Secondary | ICD-10-CM | POA: Diagnosis not present

## 2023-07-03 DIAGNOSIS — D649 Anemia, unspecified: Secondary | ICD-10-CM | POA: Diagnosis not present

## 2023-07-03 DIAGNOSIS — Z1211 Encounter for screening for malignant neoplasm of colon: Secondary | ICD-10-CM | POA: Diagnosis not present

## 2023-10-18 DIAGNOSIS — D61818 Other pancytopenia: Secondary | ICD-10-CM | POA: Diagnosis not present

## 2023-10-18 DIAGNOSIS — Z8673 Personal history of transient ischemic attack (TIA), and cerebral infarction without residual deficits: Secondary | ICD-10-CM | POA: Diagnosis not present

## 2023-10-18 DIAGNOSIS — R790 Abnormal level of blood mineral: Secondary | ICD-10-CM | POA: Diagnosis not present

## 2023-10-18 DIAGNOSIS — H9193 Unspecified hearing loss, bilateral: Secondary | ICD-10-CM | POA: Diagnosis not present

## 2023-10-18 DIAGNOSIS — I10 Essential (primary) hypertension: Secondary | ICD-10-CM | POA: Diagnosis not present

## 2023-10-18 DIAGNOSIS — Z6841 Body Mass Index (BMI) 40.0 and over, adult: Secondary | ICD-10-CM | POA: Diagnosis not present

## 2023-10-18 DIAGNOSIS — Z0000 Encounter for general adult medical examination without abnormal findings: Secondary | ICD-10-CM | POA: Diagnosis not present

## 2023-10-18 DIAGNOSIS — R7989 Other specified abnormal findings of blood chemistry: Secondary | ICD-10-CM | POA: Diagnosis not present

## 2023-10-18 DIAGNOSIS — E1165 Type 2 diabetes mellitus with hyperglycemia: Secondary | ICD-10-CM | POA: Diagnosis not present

## 2023-10-18 DIAGNOSIS — D649 Anemia, unspecified: Secondary | ICD-10-CM | POA: Diagnosis not present

## 2023-10-24 DIAGNOSIS — K7460 Unspecified cirrhosis of liver: Secondary | ICD-10-CM | POA: Diagnosis not present

## 2023-10-24 DIAGNOSIS — D61818 Other pancytopenia: Secondary | ICD-10-CM | POA: Diagnosis not present

## 2023-10-24 DIAGNOSIS — D696 Thrombocytopenia, unspecified: Secondary | ICD-10-CM | POA: Diagnosis not present

## 2023-10-24 DIAGNOSIS — Z0000 Encounter for general adult medical examination without abnormal findings: Secondary | ICD-10-CM | POA: Diagnosis not present

## 2023-10-24 DIAGNOSIS — Z6841 Body Mass Index (BMI) 40.0 and over, adult: Secondary | ICD-10-CM | POA: Diagnosis not present

## 2023-10-24 DIAGNOSIS — I10 Essential (primary) hypertension: Secondary | ICD-10-CM | POA: Diagnosis not present

## 2023-10-24 DIAGNOSIS — H9193 Unspecified hearing loss, bilateral: Secondary | ICD-10-CM | POA: Diagnosis not present

## 2023-10-24 DIAGNOSIS — R7989 Other specified abnormal findings of blood chemistry: Secondary | ICD-10-CM | POA: Diagnosis not present

## 2023-10-25 ENCOUNTER — Other Ambulatory Visit: Payer: Self-pay | Admitting: Internal Medicine

## 2023-10-25 DIAGNOSIS — D61818 Other pancytopenia: Secondary | ICD-10-CM

## 2023-10-25 DIAGNOSIS — R7989 Other specified abnormal findings of blood chemistry: Secondary | ICD-10-CM

## 2023-10-25 DIAGNOSIS — D696 Thrombocytopenia, unspecified: Secondary | ICD-10-CM

## 2023-11-21 ENCOUNTER — Ambulatory Visit

## 2024-01-14 DIAGNOSIS — E1165 Type 2 diabetes mellitus with hyperglycemia: Secondary | ICD-10-CM | POA: Diagnosis not present

## 2024-01-14 DIAGNOSIS — I10 Essential (primary) hypertension: Secondary | ICD-10-CM | POA: Diagnosis not present

## 2024-01-14 DIAGNOSIS — L0390 Cellulitis, unspecified: Secondary | ICD-10-CM | POA: Diagnosis not present

## 2024-01-14 DIAGNOSIS — L03116 Cellulitis of left lower limb: Secondary | ICD-10-CM | POA: Diagnosis not present

## 2024-01-14 DIAGNOSIS — R600 Localized edema: Secondary | ICD-10-CM | POA: Diagnosis not present

## 2024-01-17 DIAGNOSIS — L03116 Cellulitis of left lower limb: Secondary | ICD-10-CM | POA: Diagnosis not present

## 2024-01-17 DIAGNOSIS — L0390 Cellulitis, unspecified: Secondary | ICD-10-CM | POA: Diagnosis not present

## 2024-01-17 DIAGNOSIS — E1165 Type 2 diabetes mellitus with hyperglycemia: Secondary | ICD-10-CM | POA: Diagnosis not present

## 2024-01-17 DIAGNOSIS — R600 Localized edema: Secondary | ICD-10-CM | POA: Diagnosis not present

## 2024-01-17 DIAGNOSIS — I10 Essential (primary) hypertension: Secondary | ICD-10-CM | POA: Diagnosis not present

## 2024-01-23 DIAGNOSIS — Z8673 Personal history of transient ischemic attack (TIA), and cerebral infarction without residual deficits: Secondary | ICD-10-CM | POA: Diagnosis not present

## 2024-01-23 DIAGNOSIS — I10 Essential (primary) hypertension: Secondary | ICD-10-CM | POA: Diagnosis not present

## 2024-01-23 DIAGNOSIS — D696 Thrombocytopenia, unspecified: Secondary | ICD-10-CM | POA: Diagnosis not present

## 2024-01-23 DIAGNOSIS — H9193 Unspecified hearing loss, bilateral: Secondary | ICD-10-CM | POA: Diagnosis not present

## 2024-01-23 DIAGNOSIS — L03116 Cellulitis of left lower limb: Secondary | ICD-10-CM | POA: Diagnosis not present

## 2024-01-23 DIAGNOSIS — K7460 Unspecified cirrhosis of liver: Secondary | ICD-10-CM | POA: Diagnosis not present

## 2024-01-23 DIAGNOSIS — Z6839 Body mass index (BMI) 39.0-39.9, adult: Secondary | ICD-10-CM | POA: Diagnosis not present

## 2024-01-23 DIAGNOSIS — E119 Type 2 diabetes mellitus without complications: Secondary | ICD-10-CM | POA: Diagnosis not present

## 2024-02-15 DIAGNOSIS — D696 Thrombocytopenia, unspecified: Secondary | ICD-10-CM | POA: Diagnosis not present

## 2024-02-15 DIAGNOSIS — I10 Essential (primary) hypertension: Secondary | ICD-10-CM | POA: Diagnosis not present

## 2024-02-15 DIAGNOSIS — R7989 Other specified abnormal findings of blood chemistry: Secondary | ICD-10-CM | POA: Diagnosis not present

## 2024-02-15 DIAGNOSIS — D649 Anemia, unspecified: Secondary | ICD-10-CM | POA: Diagnosis not present

## 2024-02-15 DIAGNOSIS — Z6841 Body Mass Index (BMI) 40.0 and over, adult: Secondary | ICD-10-CM | POA: Diagnosis not present

## 2024-02-15 DIAGNOSIS — R7309 Other abnormal glucose: Secondary | ICD-10-CM | POA: Diagnosis not present

## 2024-02-15 DIAGNOSIS — D61818 Other pancytopenia: Secondary | ICD-10-CM | POA: Diagnosis not present

## 2024-02-15 DIAGNOSIS — H9193 Unspecified hearing loss, bilateral: Secondary | ICD-10-CM | POA: Diagnosis not present

## 2024-03-03 DIAGNOSIS — D696 Thrombocytopenia, unspecified: Secondary | ICD-10-CM | POA: Diagnosis not present

## 2024-03-03 DIAGNOSIS — K7460 Unspecified cirrhosis of liver: Secondary | ICD-10-CM | POA: Diagnosis not present

## 2024-03-03 DIAGNOSIS — E1165 Type 2 diabetes mellitus with hyperglycemia: Secondary | ICD-10-CM | POA: Diagnosis not present

## 2024-03-03 DIAGNOSIS — H9193 Unspecified hearing loss, bilateral: Secondary | ICD-10-CM | POA: Diagnosis not present

## 2024-03-03 DIAGNOSIS — Z6841 Body Mass Index (BMI) 40.0 and over, adult: Secondary | ICD-10-CM | POA: Diagnosis not present

## 2024-03-03 DIAGNOSIS — Z8673 Personal history of transient ischemic attack (TIA), and cerebral infarction without residual deficits: Secondary | ICD-10-CM | POA: Diagnosis not present

## 2024-03-12 DIAGNOSIS — Z03818 Encounter for observation for suspected exposure to other biological agents ruled out: Secondary | ICD-10-CM | POA: Diagnosis not present

## 2024-03-12 DIAGNOSIS — R6889 Other general symptoms and signs: Secondary | ICD-10-CM | POA: Diagnosis not present

## 2024-03-12 DIAGNOSIS — R058 Other specified cough: Secondary | ICD-10-CM | POA: Diagnosis not present

## 2024-03-12 DIAGNOSIS — R600 Localized edema: Secondary | ICD-10-CM | POA: Diagnosis not present

## 2024-05-15 NOTE — Progress Notes (Signed)
 Jordan Chang                                          MRN: [id number]   05/15/2024   The [facility] Quality Team Specialist reviewed this patient medical record for the purposes of chart review for care gap closure. The following were reviewed: chart review for care gap closure-controlling blood pressure.    [facility] Quality Team
